# Patient Record
Sex: Female | Born: 1975
Health system: Southern US, Community
[De-identification: ages and names within clinical notes are randomized; demographics above are authoritative.]

## PROBLEM LIST (undated history)

## (undated) DIAGNOSIS — D649 Anemia, unspecified: Secondary | ICD-10-CM

## (undated) DIAGNOSIS — E119 Type 2 diabetes mellitus without complications: Secondary | ICD-10-CM

## (undated) DIAGNOSIS — F329 Major depressive disorder, single episode, unspecified: Secondary | ICD-10-CM

## (undated) DIAGNOSIS — F419 Anxiety disorder, unspecified: Secondary | ICD-10-CM

## (undated) DIAGNOSIS — M1990 Unspecified osteoarthritis, unspecified site: Secondary | ICD-10-CM

## (undated) DIAGNOSIS — R51 Headache: Secondary | ICD-10-CM

## (undated) DIAGNOSIS — I10 Essential (primary) hypertension: Secondary | ICD-10-CM

## (undated) DIAGNOSIS — F32A Depression, unspecified: Secondary | ICD-10-CM

## (undated) HISTORY — PX: ABLATION: SHX5711

## (undated) HISTORY — PX: WISDOM TOOTH EXTRACTION: SHX21

## (undated) HISTORY — DX: Anemia, unspecified: D64.9

## (undated) HISTORY — DX: Unspecified osteoarthritis, unspecified site: M19.90

## (undated) HISTORY — DX: Type 2 diabetes mellitus without complications: E11.9

---

## 1998-02-19 ENCOUNTER — Other Ambulatory Visit: Admission: RE | Admit: 1998-02-19 | Discharge: 1998-02-19 | Payer: Self-pay | Admitting: Obstetrics and Gynecology

## 1999-03-12 ENCOUNTER — Other Ambulatory Visit: Admission: RE | Admit: 1999-03-12 | Discharge: 1999-03-12 | Payer: Self-pay | Admitting: *Deleted

## 1999-09-09 ENCOUNTER — Emergency Department (HOSPITAL_COMMUNITY): Admission: EM | Admit: 1999-09-09 | Discharge: 1999-09-09 | Payer: Self-pay | Admitting: *Deleted

## 2000-03-16 ENCOUNTER — Other Ambulatory Visit: Admission: RE | Admit: 2000-03-16 | Discharge: 2000-03-16 | Payer: Self-pay | Admitting: *Deleted

## 2000-04-06 HISTORY — PX: FRACTURE SURGERY: SHX138

## 2000-05-20 ENCOUNTER — Emergency Department (HOSPITAL_COMMUNITY): Admission: EM | Admit: 2000-05-20 | Discharge: 2000-05-20 | Payer: Self-pay | Admitting: Emergency Medicine

## 2001-02-09 ENCOUNTER — Emergency Department (HOSPITAL_COMMUNITY): Admission: EM | Admit: 2001-02-09 | Discharge: 2001-02-09 | Payer: Self-pay | Admitting: Emergency Medicine

## 2001-02-09 ENCOUNTER — Encounter: Payer: Self-pay | Admitting: Emergency Medicine

## 2001-02-16 ENCOUNTER — Inpatient Hospital Stay (HOSPITAL_COMMUNITY): Admission: AD | Admit: 2001-02-16 | Discharge: 2001-02-19 | Payer: Self-pay | Admitting: *Deleted

## 2002-04-13 ENCOUNTER — Other Ambulatory Visit: Admission: RE | Admit: 2002-04-13 | Discharge: 2002-04-13 | Payer: Self-pay | Admitting: *Deleted

## 2003-04-18 ENCOUNTER — Other Ambulatory Visit: Admission: RE | Admit: 2003-04-18 | Discharge: 2003-04-18 | Payer: Self-pay | Admitting: *Deleted

## 2003-12-05 ENCOUNTER — Emergency Department (HOSPITAL_COMMUNITY): Admission: EM | Admit: 2003-12-05 | Discharge: 2003-12-05 | Payer: Self-pay | Admitting: *Deleted

## 2004-08-18 ENCOUNTER — Ambulatory Visit (HOSPITAL_COMMUNITY): Admission: RE | Admit: 2004-08-18 | Discharge: 2004-08-18 | Payer: Self-pay | Admitting: Gastroenterology

## 2004-08-18 ENCOUNTER — Encounter (INDEPENDENT_AMBULATORY_CARE_PROVIDER_SITE_OTHER): Payer: Self-pay | Admitting: Specialist

## 2005-11-07 ENCOUNTER — Emergency Department (HOSPITAL_COMMUNITY): Admission: EM | Admit: 2005-11-07 | Discharge: 2005-11-07 | Payer: Self-pay | Admitting: Family Medicine

## 2007-02-05 ENCOUNTER — Emergency Department (HOSPITAL_COMMUNITY): Admission: EM | Admit: 2007-02-05 | Discharge: 2007-02-05 | Payer: Self-pay | Admitting: Emergency Medicine

## 2007-06-19 ENCOUNTER — Emergency Department (HOSPITAL_COMMUNITY): Admission: EM | Admit: 2007-06-19 | Discharge: 2007-06-19 | Payer: Self-pay | Admitting: Emergency Medicine

## 2007-11-14 ENCOUNTER — Emergency Department (HOSPITAL_COMMUNITY): Admission: EM | Admit: 2007-11-14 | Discharge: 2007-11-14 | Payer: Self-pay | Admitting: Family Medicine

## 2008-04-06 LAB — HM DIABETES EYE EXAM

## 2008-07-10 ENCOUNTER — Emergency Department (HOSPITAL_COMMUNITY): Admission: EM | Admit: 2008-07-10 | Discharge: 2008-07-10 | Payer: Self-pay | Admitting: Emergency Medicine

## 2009-05-02 ENCOUNTER — Ambulatory Visit (HOSPITAL_COMMUNITY): Admission: RE | Admit: 2009-05-02 | Discharge: 2009-05-02 | Payer: Self-pay | Admitting: Obstetrics & Gynecology

## 2009-09-03 ENCOUNTER — Inpatient Hospital Stay (HOSPITAL_COMMUNITY): Admission: AD | Admit: 2009-09-03 | Discharge: 2009-09-06 | Payer: Self-pay | Admitting: Obstetrics

## 2010-05-24 ENCOUNTER — Emergency Department (HOSPITAL_COMMUNITY)
Admission: EM | Admit: 2010-05-24 | Discharge: 2010-05-24 | Disposition: A | Discharge status: Home / Self Care | Payer: Self-pay | Attending: Emergency Medicine | Admitting: Emergency Medicine

## 2010-05-24 ENCOUNTER — Emergency Department (HOSPITAL_COMMUNITY): Payer: Self-pay

## 2010-05-24 ENCOUNTER — Encounter (HOSPITAL_COMMUNITY): Payer: Self-pay | Admitting: *Deleted

## 2010-05-24 DIAGNOSIS — W108XXA Fall (on) (from) other stairs and steps, initial encounter: Secondary | ICD-10-CM | POA: Insufficient documentation

## 2010-05-24 DIAGNOSIS — Y92009 Unspecified place in unspecified non-institutional (private) residence as the place of occurrence of the external cause: Secondary | ICD-10-CM | POA: Insufficient documentation

## 2010-05-24 DIAGNOSIS — O99891 Other specified diseases and conditions complicating pregnancy: Secondary | ICD-10-CM | POA: Insufficient documentation

## 2010-05-24 DIAGNOSIS — S52539A Colles' fracture of unspecified radius, initial encounter for closed fracture: Secondary | ICD-10-CM | POA: Insufficient documentation

## 2010-05-24 DIAGNOSIS — I10 Essential (primary) hypertension: Secondary | ICD-10-CM | POA: Insufficient documentation

## 2010-06-23 LAB — CBC
HCT: 35.1 % — ABNORMAL LOW (ref 36.0–46.0)
HCT: 35.8 % — ABNORMAL LOW (ref 36.0–46.0)
Hemoglobin: 11.8 g/dL — ABNORMAL LOW (ref 12.0–15.0)
Hemoglobin: 12.4 g/dL (ref 12.0–15.0)
MCHC: 33.7 g/dL (ref 30.0–36.0)
MCHC: 34.5 g/dL (ref 30.0–36.0)
MCV: 88.5 fL (ref 78.0–100.0)
MCV: 86.5 fL (ref 78.0–100.0)
Platelets: 277 10*3/uL (ref 150–400)
Platelets: 301 10*3/uL (ref 150–400)
RBC: 3.97 MIL/uL (ref 3.87–5.11)
RBC: 4.14 MIL/uL (ref 3.87–5.11)
RDW: 14.1 % (ref 11.5–15.5)
RDW: 14.5 % (ref 11.5–15.5)
WBC: 19.6 10*3/uL — ABNORMAL HIGH (ref 4.0–10.5)
WBC: 8.7 10*3/uL (ref 4.0–10.5)

## 2010-06-23 LAB — RPR: RPR Ser Ql: NONREACTIVE

## 2010-08-06 ENCOUNTER — Inpatient Hospital Stay (HOSPITAL_COMMUNITY)
Admission: AD | Admit: 2010-08-06 | Discharge: 2010-08-06 | Disposition: A | Discharge status: Home / Self Care | Payer: Medicaid Other | Source: Ambulatory Visit | Attending: Obstetrics | Admitting: Obstetrics

## 2010-08-06 DIAGNOSIS — R1032 Left lower quadrant pain: Secondary | ICD-10-CM

## 2010-08-06 DIAGNOSIS — O4700 False labor before 37 completed weeks of gestation, unspecified trimester: Secondary | ICD-10-CM | POA: Insufficient documentation

## 2010-08-06 LAB — URINALYSIS, ROUTINE W REFLEX MICROSCOPIC
Bilirubin Urine: NEGATIVE
Hgb urine dipstick: NEGATIVE
Ketones, ur: NEGATIVE mg/dL
Nitrite: NEGATIVE
Protein, ur: NEGATIVE mg/dL
Specific Gravity, Urine: 1.025 (ref 1.005–1.030)
Urobilinogen, UA: 0.2 mg/dL (ref 0.0–1.0)

## 2010-08-06 LAB — URINE MICROSCOPIC-ADD ON

## 2010-08-22 NOTE — Consult Note (Signed)
[facility]  Patient:    Stacey Ruiz, Stacey Ruiz Visit Number: [id number] MRN: [id number]          Service Type: EMS Location: ED Attending Physician:  Ilene Qua Dictated by:   Sherri Rad, M.D. Proc. Date: 02/09/01 Admit Date:  02/09/2001 Discharge Date: 02/09/2001                            Consultation Report  CHIEF COMPLAINT:  Left wrist pain.  HISTORY:  Ms. Orson Aloe is a 35 year old, 39-week-pregnant female who was horseplaying with her boyfriend when she slipped and fell onto an outstretched left upper extremity.  She had immediate pain in her left wrist.  She was then taken to [facility] ER where x-rays were obtained and I was consulted for evaluation and treatment.  PHYSICAL EXAMINATION:  EXTREMITIES:  She has tenderness to palpation over the radial styloid area with swelling.  She is nontender anywhere else.  Active range of motion of the fingers are intact.  The compartments are soft in the hand and forearm.  X-RAY FINDINGS:  X-rays reveal a displaced intra-articular radial styloid fracture.  IMPRESSION:  Intra-articular radial styloid fracture, left wrist.  PLAN:  We will place her in a sugar-tong splint.  She is to follow up with Dr. Onalee Hua III as soon as possible for surgical management of this displaced radial styloid.  If she has any problems or concerns, she is to call.  She is to keep this elevated.  Active range of motion of the fingers is encouraged. Dictated by:   Sherri Rad, M.D. Attending Physician:  Ilene Qua DD:  02/09/01 TD:  02/10/01 Job: [id number]

## 2010-08-22 NOTE — Op Note (Signed)
[facility]  Patient:    Stacey Ruiz, Stacey Ruiz Visit Number: [id number] MRN: [id number]          Service Type: OBS Location: 910B 9154 01 Attending Physician:  Lenoard Aden Dictated by:   Lenoard Aden, M.D. Proc. Date: 02/17/01 Admit Date:  02/16/2001   CC:         [facility]   Operative Report  OPERATION:                    Delivery note.  DESCRIPTION OF PROCEDURE:     I was called into the room to attend to a fetal bradycardia, fetal heart tones questionably in the 30-40 beat per minute range.  Internal scalp electrode intact, was removed.  External monitoring revealed the fetal heart tones to be in the 120-130 beat per minute range. Scalp electrode is replaced, concurring with fetal heart tones in the 120-130 beat per minute range.  Patient is without epidural and feels increased rectal pressure.  She is anterior lip and pushes well to reduce the lip.  After three spontaneous pushes, fetal vertex is apparent.  Bulb suctioning on perineum, head reaction, "girdle sign" is noted on the perineum after bulb suctioning. Patients delivery of the anterior shoulder is met with great difficulty with mild attempts at lateral traction.  At this time the decision is made to place the patient in McRoberts.  McRoberts maneuver is performed without success, and the suprapubic pressure is also met without success.  The patient is uncooperative due to a lack of regional anesthesia and nursing assistant, pediatric assistant in attendance.  At this time a fourth degree midline episiotomy is cut.  Entry and deliver of the posterior shoulder is accomplished for spontaneous delivery of a full-term female fetus, Apgars of 3 and 9, with pediatricians in attendance.  Placenta is delivered spontaneously intact.  Three-vessel cord is noted at the time of delivery.  Repair of the fourth degree laceration done in the standard fashion using a 4-0 Vicryl, a  0 Vicryl, and a 3-0 Monocryl.  After completing the repair, rectal exam reveals an intact rectum, no evidence of dehiscence along the line of the rectal incision.  There are no vaginal wall lacerations as well.  No periclitoral lacerations.  Estimated blood loss of 800 cc is noted.  Cervix is without lacerations.  No complications are noted.  The patient tolerates the procedure well and is recovering in good condition.  Neonate is also recovering in good condition.  Umbilical artery pH which was collected was 7.20. Dictated by:   Lenoard Aden, M.D. Attending Physician:  Lenoard Aden DD:  02/17/01 TD:  02/17/01 Job: [id number]

## 2010-08-22 NOTE — H&P (Signed)
[facility]  Patient:    Stacey Ruiz, HASSAN Visit Number: [id number] MRN: [id number]          Service Type: EMS Location: ED Attending Physician:  Ilene Qua Dictated by:   Marina Gravel, M.D. Admit Date:  02/09/2001 Discharge Date: 02/09/2001                           History and Physical  ADMISSION DIAGNOSES:          1. Post dates pregnancy.                               2. Unfavorable cervix.                               3. Group B strep positive.  PLANNED PROCEDURE:            Cervical ripening and subsequent induction of labor.  HISTORY OF PRESENT ILLNESS:   A 35 year old African-American female, gravida 2 para 0 A1 admitted at 34 and six-sevenths weeks for induction of labor.  The patient has an unfavorable cervix at fingertip and 50%, therefore comes in the night before induction for cervical ripening with Cytotec.  Of note, the patient sustained a fracture to the left wrist last week and there are plans for an internal fixation next week after delivery of her baby. Her hand surgeon is Dr. Onalee Hua.  Prenatal care at [facility] with Dr. Marina Gravel complicated by second trimester vaginal bleeding.  Had been noted initially to have placenta previa but this had resolved by 20 weeks.  Otherwise, the patients prenatal care has been uncomplicated.  Of note, she is group B strep positive.  PAST MEDICAL HISTORY:         None.  PAST SURGICAL HISTORY:        Arthroscopy of the knee.  MEDICATIONS:                  Prenatal vitamins.  ALLERGIES:                    None.  SOCIAL HISTORY:               No alcohol, tobacco, or other drugs.  PRENATAL LABORATORY DATA:     A positive.  Rubella immune.  Hepatitis B, HIV, GC and chlamydia - all negative.  One-hour glucose tolerance test was elevated.  Subsequent three-hour test was within normal limits.  Group B strep positive.  PHYSICAL EXAMINATION:  VITAL SIGNS:                   Blood pressure 118/90, weight 196.  HEART:                        Regular rate and rhythm.  LUNGS:                        Clear to auscultation.  ABDOMEN:                      Gravid.  Fundal height appropriate for gestational age.  PELVIC:                       Cervix is  fingertip dilated, 50% effaced, high presenting part.  LABORATORY DATA:              Ultrasound performed on February 15, 2001 showed estimated fetal weight 7 pounds 6 ounces (3358 grams) with normal amniotic fluid volume and 8/8 biophysical profile.  Posterior placenta again noted.  ASSESSMENT:                   Post term pregnancy with unfavorable cervix and need for orthopedic surgery next week.  PLAN:                         Induction of labor.  On the night prior to induction patient will receive Cytotec for cervical ripening followed by Pitocin the following morning.  Dictated by:   Marina Gravel, M.D.  Attending Physician:  Ilene Qua DD:  02/15/01 TD:  02/15/01 Job: [id number]

## 2010-08-22 NOTE — Discharge Summary (Signed)
[facility]  Patient:    Stacey Ruiz, Stacey Ruiz Visit Number: [id number] MRN: [id number]          Service Type: OBS Location: 910A 9132 01 Attending Physician:  Lenoard Aden Dictated by:   Lenoard Aden, M.D. Admit Date:  02/16/2001 Discharge Date: 02/19/2001   CC:         [facility]   Discharge Summary  SUMMARY:                      The patient was admitted on February 16, 2001 for induction and underwent complicated delivery on February 17, 2001 with moderate to severe shoulder dystocia.  Postpartum course was complicated by postpartum syncopal event with uterine atony. The patient had a stable hemoglobin and hematocrit at 8.1 postpartum. She is discharged to home on day #2.  DISCHARGE MEDICATIONS:        Tylox, Motrin, and Colace.  DISCHARGE FOLLOWUP:           Followup is scheduled in the office in four weeks. Dictated by:   Lenoard Aden, M.D. Attending Physician:  Lenoard Aden DD:  03/16/01 TD:  03/17/01 Job: [id number]
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
## 2010-08-22 NOTE — Op Note (Signed)
Stacey Ruiz, Stacey Ruiz            ACCOUNT NO.:  [id number]   MEDICAL RECORD NO.:  [id number]          PATIENT TYPE:  AMB   LOCATION:  ENDO                         FACILITY:  [facility]   PHYSICIAN:  Anselmo Rod, M.D.  DATE OF BIRTH:  07-25-1975   DATE OF PROCEDURE:  08/18/2004  DATE OF DISCHARGE:                                 OPERATIVE REPORT   PROCEDURE PERFORMED:  Colonoscopy with cold biopsies times four.   ENDOSCOPIST:  Charna Elizabeth, M.D.   INSTRUMENT USED:  Olympus video colonoscope.   INDICATIONS FOR PROCEDURE:  The patient is a 35 year old African-American  female undergoing screening colonoscopy for family history of colon cancer.  Her mother died of colon cancer at 74.  Rule out colonic polyps, masses,  etc.  The patient has had history of  mild anemia and occasional bright red  blood per rectum with straining.   PREPROCEDURE PREPARATION:  Informed consent was procured from the patient.  The patient was fasted for eight hours prior to the procedure and prepped  with a bottle of magnesium citrate and a gallon of GoLYTELY the night prior  to the procedure.  The risks and benefits of the procedure including a 10%  miss rate for colon polyps or cancers was discussed with the patient as  well.   PREPROCEDURE PHYSICAL:  The patient had stable vital signs.  Neck supple.  Chest clear to auscultation.  S1 and S2 regular.  Abdomen soft with normal  bowel sounds.   DESCRIPTION OF PROCEDURE:  The patient was placed in left lateral decubitus  position and sedated with 80 mg of Demerol and 10 mg of Versed in slow  incremental doses.  Once the patient was adequately sedated and maintained  on low flow oxygen and continuous cardiac monitoring, the Olympus video  colonoscope was advanced from the rectum to the cecum. The appendicular  orifice and ileocecal valve were clearly visualized and photographed.  The  patient had an excellent prep.  The terminal ileum appeared healthy and  without lesions.  Four small sessile polyps were biopsied from the rectum.  Some erythematous mottling was noted in the right colon.  Multiple biopsies  were done.  The exact nature of this finding is not clear to me.  Retroflexion in the rectum.  Retroflexion revealed no abnormalities.  The  patient tolerated the procedure well without complication.   IMPRESSION:  1. Four small sessile polyps biopsied from the rectum (cold biopsies).  2. Erythematous mottling of the proximal right colon, biopsies done.      Normal terminal ileum.     RECOMMENDATIONS:  1. Await pathology results.  2. Avoid all nonsteroidals including aspirin for now.  3. Repeat colorectal cancer screening depending on pathology results.  4. Outpatient followup as need arises in the future.        JNM/MEDQ  D:  08/18/2004  T:  08/18/2004  Job:  [id number]   cc:   Gerri Spore B. Earlene Plater, M.D.  [address]  [place]  [place] [address]  Fax: [phone]

## 2010-09-15 ENCOUNTER — Inpatient Hospital Stay (HOSPITAL_COMMUNITY)
Admission: AD | Admit: 2010-09-15 | Discharge: 2010-09-17 | DRG: 775 | Disposition: A | Discharge status: Home / Self Care | Payer: Medicaid Other | Source: Ambulatory Visit | Attending: Obstetrics | Admitting: Obstetrics

## 2010-09-15 DIAGNOSIS — O09529 Supervision of elderly multigravida, unspecified trimester: Secondary | ICD-10-CM | POA: Diagnosis present

## 2010-09-15 LAB — CBC
HCT: 35.3 % — ABNORMAL LOW (ref 36.0–46.0)
MCV: 83.1 fL (ref 78.0–100.0)
RBC: 4.25 MIL/uL (ref 3.87–5.11)
RDW: 14.1 % (ref 11.5–15.5)
WBC: 9.4 10*3/uL (ref 4.0–10.5)

## 2010-09-16 LAB — CBC
Hemoglobin: 10.4 g/dL — ABNORMAL LOW (ref 12.0–15.0)
MCH: 28.7 pg (ref 26.0–34.0)
MCV: 84 fL (ref 78.0–100.0)
Platelets: 250 10*3/uL (ref 150–400)
RBC: 3.63 MIL/uL — ABNORMAL LOW (ref 3.87–5.11)
WBC: 13.2 10*3/uL — ABNORMAL HIGH (ref 4.0–10.5)

## 2010-09-16 LAB — ABO/RH: ABO/RH(D): A POS

## 2010-09-18 ENCOUNTER — Inpatient Hospital Stay (HOSPITAL_COMMUNITY): Admission: AD | Admit: 2010-09-18 | Payer: Medicaid Other | Source: Ambulatory Visit | Admitting: Obstetrics

## 2010-10-28 ENCOUNTER — Other Ambulatory Visit: Payer: Self-pay | Admitting: Obstetrics & Gynecology

## 2010-10-28 DIAGNOSIS — Z302 Encounter for sterilization: Secondary | ICD-10-CM

## 2010-11-12 ENCOUNTER — Encounter (HOSPITAL_COMMUNITY): Payer: Self-pay | Admitting: *Deleted

## 2010-11-28 ENCOUNTER — Encounter (HOSPITAL_COMMUNITY): Payer: Self-pay | Admitting: Anesthesiology

## 2010-11-28 ENCOUNTER — Encounter (HOSPITAL_COMMUNITY)
Admission: RE | Disposition: A | Discharge status: Home / Self Care | Payer: Self-pay | Source: Ambulatory Visit | Attending: Obstetrics & Gynecology

## 2010-11-28 ENCOUNTER — Ambulatory Visit (HOSPITAL_COMMUNITY): Payer: BC Managed Care – PPO | Admitting: Anesthesiology

## 2010-11-28 ENCOUNTER — Ambulatory Visit (HOSPITAL_COMMUNITY)
Admission: RE | Admit: 2010-11-28 | Discharge: 2010-11-28 | Disposition: A | Discharge status: Home / Self Care | Payer: BC Managed Care – PPO | Source: Ambulatory Visit | Attending: Obstetrics & Gynecology | Admitting: Obstetrics & Gynecology

## 2010-11-28 DIAGNOSIS — Z302 Encounter for sterilization: Secondary | ICD-10-CM | POA: Insufficient documentation

## 2010-11-28 DIAGNOSIS — Z309 Encounter for contraceptive management, unspecified: Secondary | ICD-10-CM

## 2010-11-28 HISTORY — DX: Major depressive disorder, single episode, unspecified: F32.9

## 2010-11-28 HISTORY — DX: Depression, unspecified: F32.A

## 2010-11-28 HISTORY — DX: Headache: R51

## 2010-11-28 HISTORY — DX: Anxiety disorder, unspecified: F41.9

## 2010-11-28 HISTORY — PX: TUBAL LIGATION: SHX77

## 2010-11-28 LAB — CBC
HCT: 40.2 % (ref 36.0–46.0)
Hemoglobin: 13.7 g/dL (ref 12.0–15.0)
RBC: 4.69 MIL/uL (ref 3.87–5.11)

## 2010-11-28 LAB — HCG, SERUM, QUALITATIVE: Preg, Serum: NEGATIVE

## 2010-11-28 LAB — BASIC METABOLIC PANEL
CO2: 29 mEq/L (ref 19–32)
Chloride: 99 mEq/L (ref 96–112)
Glucose, Bld: 93 mg/dL (ref 70–99)
Sodium: 137 mEq/L (ref 135–145)

## 2010-11-28 SURGERY — ESSURE TUBAL STERILIZATION
Anesthesia: General | Site: Vagina | Wound class: Clean Contaminated

## 2010-11-28 MED ORDER — MIDAZOLAM HCL 2 MG/2ML IJ SOLN
INTRAMUSCULAR | Status: AC
Start: 2010-11-28 — End: 2010-11-28
  Filled 2010-11-28: qty 2

## 2010-11-28 MED ORDER — KETOROLAC TROMETHAMINE 30 MG/ML IJ SOLN
INTRAMUSCULAR | Status: AC
  Administered 2010-11-28: 30 mg via INTRAVENOUS
  Filled 2010-11-28: qty 1

## 2010-11-28 MED ORDER — NORETHINDRONE ACETATE 5 MG PO TABS: 5.0000 mg | ORAL_TABLET | Freq: Every day | ORAL | Status: DC

## 2010-11-28 MED ORDER — FENTANYL CITRATE 0.05 MG/ML IJ SOLN
INTRAMUSCULAR | Status: DC | PRN
  Administered 2010-11-28: 100 ug via INTRAVENOUS

## 2010-11-28 MED ORDER — ONDANSETRON HCL 4 MG/2ML IJ SOLN
INTRAMUSCULAR | Status: DC | PRN
  Administered 2010-11-28: 4 mg via INTRAVENOUS

## 2010-11-28 MED ORDER — PROMETHAZINE HCL 25 MG/ML IJ SOLN: 6.2500 mg | INTRAMUSCULAR | Status: DC | PRN

## 2010-11-28 MED ORDER — LACTATED RINGERS IV SOLN
INTRAVENOUS | Status: DC
  Administered 2010-11-28 (×3): via INTRAVENOUS

## 2010-11-28 MED ORDER — KETOROLAC TROMETHAMINE 30 MG/ML IJ SOLN
30.0000 mg | Freq: Once | INTRAMUSCULAR | Status: AC
  Administered 2010-11-28: 30 mg via INTRAVENOUS

## 2010-11-28 MED ORDER — FENTANYL CITRATE 0.05 MG/ML IJ SOLN: 25.0000 ug | INTRAMUSCULAR | Status: DC | PRN

## 2010-11-28 MED ORDER — ACETAMINOPHEN 325 MG PO TABS: 325.0000 mg | ORAL_TABLET | ORAL | Status: DC | PRN

## 2010-11-28 MED ORDER — ONDANSETRON HCL 4 MG/2ML IJ SOLN
INTRAMUSCULAR | Status: AC
  Filled 2010-11-28: qty 2

## 2010-11-28 MED ORDER — FENTANYL CITRATE 0.05 MG/ML IJ SOLN
INTRAMUSCULAR | Status: AC
  Filled 2010-11-28: qty 2

## 2010-11-28 MED ORDER — SODIUM CHLORIDE 0.9 % IR SOLN
Status: DC | PRN
  Administered 2010-11-28: 3000 mL

## 2010-11-28 MED ORDER — DEXAMETHASONE SODIUM PHOSPHATE 10 MG/ML IJ SOLN
INTRAMUSCULAR | Status: AC
  Filled 2010-11-28: qty 1

## 2010-11-28 MED ORDER — MIDAZOLAM HCL 5 MG/5ML IJ SOLN
INTRAMUSCULAR | Status: DC | PRN
  Administered 2010-11-28: 2 mg via INTRAVENOUS

## 2010-11-28 MED ORDER — PROPOFOL 10 MG/ML IV EMUL
INTRAVENOUS | Status: DC | PRN
  Administered 2010-11-28: 180 mg via INTRAVENOUS

## 2010-11-28 MED ORDER — KETOROLAC TROMETHAMINE 30 MG/ML IJ SOLN: 15.0000 mg | Freq: Once | INTRAMUSCULAR | Status: DC | PRN

## 2010-11-28 MED ORDER — LIDOCAINE HCL (CARDIAC) 20 MG/ML IV SOLN
INTRAVENOUS | Status: AC
  Filled 2010-11-28: qty 5

## 2010-11-28 MED ORDER — DEXAMETHASONE SODIUM PHOSPHATE 10 MG/ML IJ SOLN
INTRAMUSCULAR | Status: DC | PRN
  Administered 2010-11-28: 10 mg via INTRAVENOUS

## 2010-11-28 MED ORDER — LIDOCAINE HCL (PF) 2 % IJ SOLN
INTRAMUSCULAR | Status: DC | PRN
  Administered 2010-11-28: 10 mL

## 2010-11-28 MED ORDER — PROPOFOL 10 MG/ML IV EMUL
INTRAVENOUS | Status: AC
  Filled 2010-11-28: qty 20

## 2010-11-28 MED ORDER — LIDOCAINE HCL (CARDIAC) 20 MG/ML IV SOLN
INTRAVENOUS | Status: DC | PRN
  Administered 2010-11-28: 60 mg via INTRAVENOUS

## 2010-11-28 SURGICAL SUPPLY — 10 items
CATH ROBINSON RED A/P 16FR (CATHETERS) ×2 IMPLANT
CLOTH BEACON ORANGE TIMEOUT ST (SAFETY) ×2 IMPLANT
CONTAINER PREFILL 10% NBF 60ML (FORM) IMPLANT
DRAPE UTILITY XL STRL (DRAPES) ×2 IMPLANT
GLOVE BIO SURGEON STRL SZ 6.5 (GLOVE) ×4 IMPLANT
GOWN PREVENTION PLUS LG XLONG (DISPOSABLE) ×4 IMPLANT
KIT ESSURE FALLOPIAN CLOSURE (Ring) ×2 IMPLANT
PACK HYSTEROSCOPY LF (CUSTOM PROCEDURE TRAY) ×2 IMPLANT
TOWEL OR 17X24 6PK STRL BLUE (TOWEL DISPOSABLE) ×4 IMPLANT
WATER STERILE IRR 1000ML POUR (IV SOLUTION) ×2 IMPLANT

## 2010-11-28 NOTE — H&P (Signed)
  Chief Complaint: 35 y.o.  who presents for an Essure procedure  Details of Present Illness: The patient desires a sterilization procedure.  BP 120/73  Pulse 82  Temp(Src) 98.8 F (37.1 C) (Oral)  Resp 18  Ht 5\' 5"  (1.651 m)  Wt 83.915 kg (185 lb)  BMI 30.79 kg/m2  SpO2 100%  LMP 11/02/2010  Breastfeeding? No  Past Medical History  Diagnosis Date  . Headache     migraines  . Anxiety   . Depression    History   Social History  . Marital Status: Married    Spouse Name: N/A    Number of Children: N/A  . Years of Education: N/A   Occupational History  . Not on file.   Social History Main Topics  . Smoking status: Former Smoker    Quit date: 11/12/2007  . Smokeless tobacco: Not on file  . Alcohol Use: 4.2 oz/week    7 Glasses of wine per week  . Drug Use: Yes    Special: Marijuana     highschool  . Sexually Active: Yes    Birth Control/ Protection: Injection   Other Topics Concern  . Not on file   Social History Narrative  . No narrative on file   History reviewed. No pertinent family history.  Genitourinary:positive for abnormal menstrual periods  Pre-Op Diagnosis: abnormal uterine bleeding; desire sterilization   Planned Procedure: Procedure(s): ESSURE TUBAL STERILIZATION  I have reviewed the patient's history and have completed the physical exam and Stacey Ruiz is acceptable for surgery.  Roseanna Rainbow, MD 11/28/2010 2:43 PM

## 2010-11-28 NOTE — Anesthesia Preprocedure Evaluation (Signed)
Anesthesia Evaluation  Name, MR# and DOB Patient awake  General Assessment Comment  Reviewed: Allergy & Precautions, H&P , Patient's Chart, lab work & pertinent test results, reviewed documented beta blocker date and time   History of Anesthesia Complications Negative for: history of anesthetic complications  Airway Mallampati: II TM Distance: >3 FB Neck ROM: full    Dental No notable dental hx.    Pulmonary  clear to auscultation  pulmonary exam normalPulmonary Exam Normal breath sounds clear to auscultation none    Cardiovascular Exercise Tolerance: Good hypertension, On Medications regular Normal    Neuro/Psych   Headaches  (+) PSYCHIATRIC DISORDERS,  Negative Neurological ROS  Negative Psych ROS  GI/Hepatic/Renal negative GI ROS  negative Liver ROS  negative Renal ROS        Endo/Other  Negative Endocrine ROS (+)      Abdominal   Musculoskeletal   Hematology negative hematology ROS (+)   Peds  Reproductive/Obstetrics negative OB ROS    Anesthesia Other Findings             Anesthesia Physical Anesthesia Plan  ASA: II  Anesthesia Plan: General   Post-op Pain Management:    Induction:   Airway Management Planned:   Additional Equipment:   Intra-op Plan:   Post-operative Plan:   Informed Consent: I have reviewed the patients History and Physical, chart, labs and discussed the procedure including the risks, benefits and alternatives for the proposed anesthesia with the patient or authorized representative who has indicated his/her understanding and acceptance.   Dental Advisory Given  Plan Discussed with: CRNA and Surgeon  Anesthesia Plan Comments:         Anesthesia Quick Evaluation

## 2010-11-28 NOTE — Op Note (Signed)
Preoperative diagnosis: Multiparity, desires sterilization  Postoperative diagnosis: Same  Procedure: Hysteroscopy, Essure tubal occlusion Surgeon: Antionette Char A  Anesthesia:LMA, paracervical block  Estimated blood loss: Minimal  Urine output:  per Anesthesiology  IV Fluids:  per Anesthesiology  Complications: None  Specimen: N/A  Operative Findings: Lush endometrium  Description of procedure:   The patient was taken to the operating room and placed on the operating table in the semi-lithotomy position in [place] stirrups.  Examination under anesthesia was performed.  The patient was prepped and draped in the usual manner.  After a time-out had been completed, a speculum was placed in the vagina.  The anterior lip of the cervix was grasped with a single-toothed tenaculum.  A paracervical block was performed using 10 ml of 1% lidocaine.  The block was performed at 4 and 8 o'clock at the cervical vaginal junction.  A 5 mm 30 hysteroscope was then inserted under direct visualization using glycine as a distending medium. The uterine cavity was viewed and the above findings were noted. The Essure tubal occlusion was then inserted through the operative port and the tip of the Essure device easily slid into the left ostia. The coil was advanced and easily placed.   The device was withdrawn. There were 0 trailing coils in the uterine cavity after removal of the insertion device. The device was removed and reloaded. The device was inserted into the right ostia in a similar fashion. There were 6 trailing coils in the uterine cavity.  The device was remove with a grasper.  Subsequent attempts to place another device were aborted secondary limited visualization--unable to distend the cavity.  All the instruments were removed from the vagina.  Final instrument counts were correct.  The patient was taken to the PACU in stable condition.

## 2010-11-28 NOTE — Transfer of Care (Signed)
  Anesthesia Post-op Note  Patient: Stacey Ruiz  Procedure(s) Performed:  ESSURE TUBAL STERILIZATION - Attempted essure sterilization. Essure device placed in leftt side only. could not do procedure on right fallopian tube.   Patient Location: PACU  Anesthesia Type: General  Level of Consciousness: awake, alert  and oriented  Airway and Oxygen Therapy: Patient Spontanous Breathing and Patient connected to nasal cannula oxygen  Post-op Pain: none  Post-op Assessment: Post-op Vital signs reviewed and Patient's Cardiovascular Status Stable  Post-op Vital Signs: Reviewed and stable  Complications: No apparent anesthesia complications

## 2010-11-28 NOTE — Anesthesia Postprocedure Evaluation (Signed)
Anesthesia Post Note  Patient: Stacey Ruiz  Anesthesia Post Note  Patient: Stacey Ruiz  Procedure(s) Performed:  ESSURE TUBAL STERILIZATION - Attempted essure sterilization. Essure device placed in leftt side only. could not do procedure on right fallopian tube.   Anesthesia type: GA  Patient location: PACU  Post pain: Pain level controlled  Post assessment: Post-op Vital signs reviewed  Last Vitals:  Filed Vitals:   11/28/10 1645  BP: 114/70  Pulse: 65  Temp:   Resp: 12    Post vital signs: Reviewed  Level of consciousness: sedated  Complications: No apparent anesthesia complications

## 2010-12-01 ENCOUNTER — Other Ambulatory Visit: Payer: Self-pay | Admitting: Obstetrics & Gynecology

## 2010-12-04 ENCOUNTER — Other Ambulatory Visit: Payer: Self-pay | Admitting: Obstetrics & Gynecology

## 2010-12-05 ENCOUNTER — Encounter (HOSPITAL_COMMUNITY)
Admission: RE | Disposition: A | Discharge status: Home / Self Care | Payer: Self-pay | Source: Ambulatory Visit | Attending: Obstetrics & Gynecology

## 2010-12-05 ENCOUNTER — Encounter (HOSPITAL_COMMUNITY): Payer: Self-pay | Admitting: Anesthesiology

## 2010-12-05 ENCOUNTER — Ambulatory Visit (HOSPITAL_COMMUNITY)
Admission: RE | Admit: 2010-12-05 | Discharge: 2010-12-05 | Disposition: A | Discharge status: Home / Self Care | Payer: BC Managed Care – PPO | Source: Ambulatory Visit | Attending: Obstetrics & Gynecology | Admitting: Obstetrics & Gynecology

## 2010-12-05 ENCOUNTER — Ambulatory Visit (HOSPITAL_COMMUNITY): Payer: BC Managed Care – PPO | Admitting: Anesthesiology

## 2010-12-05 ENCOUNTER — Encounter (HOSPITAL_COMMUNITY): Payer: Self-pay | Admitting: Obstetrics & Gynecology

## 2010-12-05 DIAGNOSIS — Z302 Encounter for sterilization: Secondary | ICD-10-CM | POA: Insufficient documentation

## 2010-12-05 HISTORY — PX: TUBAL LIGATION: SHX77

## 2010-12-05 LAB — SURGICAL PCR SCREEN: MRSA, PCR: NEGATIVE

## 2010-12-05 LAB — PREGNANCY, URINE: Preg Test, Ur: NEGATIVE

## 2010-12-05 SURGERY — ESSURE TUBAL STERILIZATION
Anesthesia: General | Site: Vagina | Laterality: Right | Wound class: Clean Contaminated

## 2010-12-05 MED ORDER — OXYCODONE-ACETAMINOPHEN 5-325 MG PO TABS: 2.0000 | ORAL_TABLET | Freq: Four times a day (QID) | ORAL | Status: AC | PRN

## 2010-12-05 MED ORDER — MIDAZOLAM HCL 5 MG/5ML IJ SOLN
INTRAMUSCULAR | Status: DC | PRN
  Administered 2010-12-05: .5 mg via INTRAVENOUS
  Administered 2010-12-05: 1.5 mg via INTRAVENOUS

## 2010-12-05 MED ORDER — KETOROLAC TROMETHAMINE 30 MG/ML IJ SOLN
INTRAMUSCULAR | Status: DC | PRN
  Administered 2010-12-05: 30 mg via INTRAVENOUS

## 2010-12-05 MED ORDER — KETOROLAC TROMETHAMINE 30 MG/ML IJ SOLN
INTRAMUSCULAR | Status: AC
  Administered 2010-12-05: 30 mg via INTRAVENOUS
  Filled 2010-12-05: qty 1

## 2010-12-05 MED ORDER — FENTANYL CITRATE 0.05 MG/ML IJ SOLN
INTRAMUSCULAR | Status: DC | PRN
  Administered 2010-12-05: 100 ug via INTRAVENOUS

## 2010-12-05 MED ORDER — LACTATED RINGERS IV SOLN
INTRAVENOUS | Status: DC
  Administered 2010-12-05 (×2): via INTRAVENOUS

## 2010-12-05 MED ORDER — MIDAZOLAM HCL 2 MG/2ML IJ SOLN
INTRAMUSCULAR | Status: AC
  Filled 2010-12-05: qty 2

## 2010-12-05 MED ORDER — PROPOFOL 10 MG/ML IV EMUL
INTRAVENOUS | Status: DC | PRN
  Administered 2010-12-05: 200 mg via INTRAVENOUS

## 2010-12-05 MED ORDER — DEXAMETHASONE SODIUM PHOSPHATE 4 MG/ML IJ SOLN
INTRAMUSCULAR | Status: DC | PRN
  Administered 2010-12-05: 10 mg via INTRAVENOUS

## 2010-12-05 MED ORDER — KETOROLAC TROMETHAMINE 30 MG/ML IJ SOLN
INTRAMUSCULAR | Status: AC
  Filled 2010-12-05: qty 1

## 2010-12-05 MED ORDER — LIDOCAINE HCL (CARDIAC) 20 MG/ML IV SOLN
INTRAVENOUS | Status: AC
  Filled 2010-12-05: qty 5

## 2010-12-05 MED ORDER — LIDOCAINE HCL (CARDIAC) 20 MG/ML IV SOLN
INTRAVENOUS | Status: DC | PRN
  Administered 2010-12-05: 100 mg via INTRAVENOUS

## 2010-12-05 MED ORDER — MEPERIDINE HCL 25 MG/ML IJ SOLN: 6.2500 mg | INTRAMUSCULAR | Status: DC | PRN

## 2010-12-05 MED ORDER — DEXAMETHASONE SODIUM PHOSPHATE 10 MG/ML IJ SOLN
INTRAMUSCULAR | Status: AC
  Filled 2010-12-05: qty 1

## 2010-12-05 MED ORDER — LIDOCAINE HCL 1 % IJ SOLN
INTRAMUSCULAR | Status: DC | PRN
  Administered 2010-12-05: 10 mL

## 2010-12-05 MED ORDER — ONDANSETRON HCL 4 MG/2ML IJ SOLN
INTRAMUSCULAR | Status: DC | PRN
  Administered 2010-12-05: 4 mg via INTRAVENOUS

## 2010-12-05 MED ORDER — FENTANYL CITRATE 0.05 MG/ML IJ SOLN: 25.0000 ug | INTRAMUSCULAR | Status: DC | PRN

## 2010-12-05 MED ORDER — FENTANYL CITRATE 0.05 MG/ML IJ SOLN
INTRAMUSCULAR | Status: AC
  Filled 2010-12-05: qty 2

## 2010-12-05 MED ORDER — KETOROLAC TROMETHAMINE 30 MG/ML IJ SOLN: 15.0000 mg | Freq: Once | INTRAMUSCULAR | Status: DC | PRN

## 2010-12-05 MED ORDER — MUPIROCIN 2 % EX OINT
TOPICAL_OINTMENT | CUTANEOUS | Status: AC
  Filled 2010-12-05: qty 22

## 2010-12-05 MED ORDER — SODIUM CHLORIDE 0.9 % IR SOLN
Status: DC | PRN
  Administered 2010-12-05: 3000 mL

## 2010-12-05 MED ORDER — ONDANSETRON HCL 4 MG/2ML IJ SOLN: 4.0000 mg | Freq: Once | INTRAMUSCULAR | Status: DC | PRN

## 2010-12-05 MED ORDER — PROPOFOL 10 MG/ML IV EMUL
INTRAVENOUS | Status: AC
  Filled 2010-12-05: qty 20

## 2010-12-05 MED ORDER — ONDANSETRON HCL 4 MG/2ML IJ SOLN
INTRAMUSCULAR | Status: AC
  Filled 2010-12-05: qty 2

## 2010-12-05 SURGICAL SUPPLY — 17 items
CATH ROBINSON RED A/P 16FR (CATHETERS) ×1 IMPLANT
CHLORAPREP W/TINT 26ML (MISCELLANEOUS) ×1 IMPLANT
CLOTH BEACON ORANGE TIMEOUT ST (SAFETY) ×3 IMPLANT
CONTAINER PREFILL 10% NBF 60ML (FORM) IMPLANT
DERMABOND ADVANCED (GAUZE/BANDAGES/DRESSINGS) ×1 IMPLANT
DRAPE UTILITY XL STRL (DRAPES) ×1 IMPLANT
GLOVE BIO SURGEON STRL SZ 6.5 (GLOVE) ×6 IMPLANT
GOWN PREVENTION PLUS LG XLONG (DISPOSABLE) ×2 IMPLANT
KIT ESSURE FALLOPIAN CLOSURE (Ring) ×3 IMPLANT
PACK HYSTEROSCOPY LF (CUSTOM PROCEDURE TRAY) ×3 IMPLANT
PACK LAPAROSCOPY BASIN (CUSTOM PROCEDURE TRAY) ×1 IMPLANT
SUT VIC AB 3-0 PS2 18 (SUTURE)
SUT VIC AB 3-0 PS2 18XBRD (SUTURE) IMPLANT
SUT VICRYL 0 UR6 27IN ABS (SUTURE) IMPLANT
TOWEL OR 17X24 6PK STRL BLUE (TOWEL DISPOSABLE) ×6 IMPLANT
TROCAR XCEL NON-BLD 11X100MML (ENDOMECHANICALS) ×1 IMPLANT
WATER STERILE IRR 1000ML POUR (IV SOLUTION) ×3 IMPLANT

## 2010-12-05 NOTE — Op Note (Signed)
Preoperative diagnosis: Multiparity, desires sterilization  Postoperative diagnosis: Same  Procedure: Hysteroscopy, Essure tubal occlusion Surgeon: Antionette Char A  Anesthesia: LMA, paracervical block  Estimated blood loss: Minimal  Urine output: 100 ml  IV Fluids:  per Anesthesiology  Complications: None  Specimen: N/A  Operative Findings: Normal endometrial cavity.  Slightly lush endometrium.  Description of procedure:   The patient was taken to the operating room and placed on the operating table in the semi-lithotomy position in [place] stirrups.  Examination under anesthesia was performed.  The patient was prepped and draped in the usual manner.  After a time-out had been completed, a speculum was placed in the vagina.  The anterior lip of the cervix was grasped with a single-toothed tenaculum.  A paracervical block was performed using 10 ml of 1% lidocaine.  The block was performed at 4 and 8 o'clock at the cervical vaginal junction.  A 5 mm 30 hysteroscope was then inserted under direct visualization using glycine as a distending medium. The uterine cavity was viewed and the above findings were noted. The Essure tubal occlusion was then inserted through the operative port and the tip of the Essure device easily slid into the right ostia. The coil was advanced and easily placed.   The device was withdrawn. There were 2 trailing coils in the uterine cavity after removal of the insertion device. The device was removed.  The left side was previously done.   All the instruments were removed from the vagina.  Final instrument counts were correct.  The patient was taken to the PACU in stable condition.

## 2010-12-05 NOTE — Transfer of Care (Signed)
Immediate Anesthesia Transfer of Care Note  Patient: Stacey Ruiz  Procedure(s) Performed:  ESSURE TUBAL STERILIZATION  Patient Location: PACU  Anesthesia Type: General  Level of Consciousness: awake, alert  and oriented  Airway & Oxygen Therapy: Patient Spontanous Breathing and Patient connected to nasal cannula oxygen  Post-op Assessment: Report given to PACU RN, Post -op Vital signs reviewed and stable and Patient moving all extremities X 4  Post vital signs: Reviewed and stable  Complications: No apparent anesthesia complications

## 2010-12-05 NOTE — Anesthesia Preprocedure Evaluation (Signed)
Anesthesia Evaluation  Name, MR# and DOB Patient awake  General Assessment Comment  Reviewed: Allergy & Precautions, H&P , NPO status , Patient's Chart, lab work & pertinent test results  Airway Mallampati: I      Dental  (+) Teeth Intact   Pulmonary    pulmonary exam normalPulmonary Exam Normal     Cardiovascular     Neuro/Psych   Headaches  (+) Anxiety, Depression,    GI/Hepatic/Renal negative GI ROS  negative Liver ROS  negative Renal ROS        Endo/Other  Negative Endocrine ROS (+)      Abdominal Normal abdominal exam  (+)   Musculoskeletal negative musculoskeletal ROS (+)   Hematology negative hematology ROS (+)   Peds negative pediatric ROS (+)  Reproductive/Obstetrics negative OB ROS    Anesthesia Other Findings             Anesthesia Physical Anesthesia Plan  ASA: II  Anesthesia Plan: General   Post-op Pain Management:    Induction: Intravenous  Airway Management Planned: Oral ETT  Additional Equipment:   Intra-op Plan:   Post-operative Plan:   Informed Consent:   Plan Discussed with: CRNA  Anesthesia Plan Comments:         Anesthesia Quick Evaluation

## 2010-12-05 NOTE — Anesthesia Postprocedure Evaluation (Signed)
Anesthesia Post Note  Patient: Stacey Ruiz  Procedure(s) Performed:  ESSURE TUBAL STERILIZATION  Anesthesia type: GA  Patient location: PACU  Post pain: Pain level controlled  Post assessment: Post-op Vital signs reviewed  Last Vitals:  Filed Vitals:   12/05/10 1400  BP:   Pulse: 58  Temp:   Resp: 13    Post vital signs: Reviewed  Level of consciousness: sedated  Complications: No apparent anesthesia complications

## 2010-12-05 NOTE — H&P (Signed)
  Chief Complaint: 35 y.o.  Presents to complete the Essure procedure.  Details of Present Illness: One week ago, the left tube was cannulated with the Essure.  Technical issues precluded completion of the procedure.   BP 113/80  Pulse 88  Temp(Src) 98.6 F (37 C) (Oral)  Resp 18  Ht 5\' 5"  (1.651 m)  Wt 83.915 kg (185 lb)  BMI 30.79 kg/m2  SpO2 100%  Past Medical History  Diagnosis Date  . Headache     migraines  . Anxiety   . Depression    History   Social History  . Marital Status: Married    Spouse Name: N/A    Number of Children: N/A  . Years of Education: N/A   Occupational History  . Not on file.   Social History Main Topics  . Smoking status: Former Smoker    Quit date: 11/12/2007  . Smokeless tobacco: Not on file  . Alcohol Use: 4.2 oz/week    7 Glasses of wine per week  . Drug Use: Yes    Special: Marijuana     highschool  . Sexually Active: Yes    Birth Control/ Protection: Injection   Other Topics Concern  . Not on file   Social History Narrative  . No narrative on file   History reviewed. No pertinent family history.  Pertinent items are noted in HPI.  Pre-Op Diagnosis: desires sterilization   Planned Procedure: Procedure(s): ESSURE TUBAL STERILIZATION LAPAROSCOPIC TUBAL LIGATION  I have reviewed the patient's history and have completed the physical exam and Stacey Ruiz is acceptable for surgery.  Roseanna Rainbow, MD 12/05/2010 12:42 PM

## 2010-12-16 ENCOUNTER — Encounter (HOSPITAL_COMMUNITY): Payer: Self-pay | Admitting: Obstetrics & Gynecology

## 2010-12-29 LAB — POCT URINALYSIS DIP (DEVICE)
Bilirubin Urine: NEGATIVE
Hgb urine dipstick: NEGATIVE
Ketones, ur: NEGATIVE
Specific Gravity, Urine: 1.02
pH: 5.5

## 2010-12-29 LAB — POCT PREGNANCY, URINE: Operator id: 235561

## 2011-01-02 LAB — POCT URINALYSIS DIP (DEVICE)
Bilirubin Urine: NEGATIVE
Glucose, UA: NEGATIVE
Hgb urine dipstick: NEGATIVE
Ketones, ur: NEGATIVE
Nitrite: NEGATIVE

## 2011-01-13 LAB — URINALYSIS, ROUTINE W REFLEX MICROSCOPIC
Hgb urine dipstick: NEGATIVE
Nitrite: NEGATIVE
Specific Gravity, Urine: 1.011
Urobilinogen, UA: 1
pH: 6.5

## 2011-01-13 LAB — WET PREP, GENITAL

## 2011-01-13 LAB — POCT PREGNANCY, URINE
Operator id: 26520
Preg Test, Ur: NEGATIVE

## 2011-01-13 LAB — RPR: RPR Ser Ql: NONREACTIVE

## 2011-01-14 ENCOUNTER — Other Ambulatory Visit: Payer: Self-pay | Admitting: Obstetrics & Gynecology

## 2011-01-14 DIAGNOSIS — N971 Female infertility of tubal origin: Secondary | ICD-10-CM

## 2011-03-04 ENCOUNTER — Ambulatory Visit (HOSPITAL_COMMUNITY): Payer: BC Managed Care – PPO

## 2011-03-07 LAB — HM PAP SMEAR

## 2011-04-07 LAB — HM COLONOSCOPY

## 2011-05-07 ENCOUNTER — Ambulatory Visit (HOSPITAL_COMMUNITY)
Admission: RE | Admit: 2011-05-07 | Discharge: 2011-05-07 | Disposition: A | Discharge status: Home / Self Care | Payer: BC Managed Care – PPO | Source: Ambulatory Visit | Attending: Obstetrics & Gynecology | Admitting: Obstetrics & Gynecology

## 2011-05-07 DIAGNOSIS — N971 Female infertility of tubal origin: Secondary | ICD-10-CM

## 2011-05-07 DIAGNOSIS — Z3049 Encounter for surveillance of other contraceptives: Secondary | ICD-10-CM | POA: Insufficient documentation

## 2011-05-07 MED ORDER — IOHEXOL 300 MG/ML  SOLN: 10.0000 mL | Freq: Once | INTRAMUSCULAR | Status: AC | PRN

## 2011-05-21 LAB — CBC
HCT: 42 %
MCH: 29.6
MCV: 87.2 fL
RDW: 13.2
platelet count: 326

## 2011-06-26 ENCOUNTER — Ambulatory Visit (HOSPITAL_COMMUNITY)
Admission: RE | Admit: 2011-06-26 | Payer: BC Managed Care – PPO | Source: Ambulatory Visit | Admitting: Obstetrics & Gynecology

## 2011-06-26 ENCOUNTER — Encounter (HOSPITAL_COMMUNITY): Admission: RE | Payer: Self-pay | Source: Ambulatory Visit

## 2011-06-26 SURGERY — LIGATION, FALLOPIAN TUBE, LAPAROSCOPIC
Anesthesia: Choice

## 2011-09-07 ENCOUNTER — Other Ambulatory Visit (INDEPENDENT_AMBULATORY_CARE_PROVIDER_SITE_OTHER): Payer: BC Managed Care – PPO

## 2011-09-07 ENCOUNTER — Ambulatory Visit (INDEPENDENT_AMBULATORY_CARE_PROVIDER_SITE_OTHER): Payer: BC Managed Care – PPO | Admitting: Internal Medicine

## 2011-09-07 ENCOUNTER — Encounter: Payer: Self-pay | Admitting: Internal Medicine

## 2011-09-07 VITALS — BP 114/80 | HR 82 | Temp 98.5°F | Resp 16 | Ht 65.0 in | Wt 205.2 lb

## 2011-09-07 DIAGNOSIS — I10 Essential (primary) hypertension: Secondary | ICD-10-CM | POA: Insufficient documentation

## 2011-09-07 DIAGNOSIS — Z0000 Encounter for general adult medical examination without abnormal findings: Secondary | ICD-10-CM

## 2011-09-07 DIAGNOSIS — Z23 Encounter for immunization: Secondary | ICD-10-CM

## 2011-09-07 LAB — URINALYSIS, ROUTINE W REFLEX MICROSCOPIC
Bilirubin Urine: NEGATIVE
Ketones, ur: NEGATIVE
Nitrite: NEGATIVE
Specific Gravity, Urine: 1.02 (ref 1.000–1.030)
pH: 6.5 (ref 5.0–8.0)

## 2011-09-07 LAB — CBC WITH DIFFERENTIAL/PLATELET
Basophils Relative: 0.6 % (ref 0.0–3.0)
Eosinophils Relative: 1.4 % (ref 0.0–5.0)
HCT: 41 % (ref 36.0–46.0)
Hemoglobin: 13.7 g/dL (ref 12.0–15.0)
Lymphs Abs: 2.1 10*3/uL (ref 0.7–4.0)
MCV: 85.4 fl (ref 78.0–100.0)
Monocytes Absolute: 0.4 10*3/uL (ref 0.1–1.0)
Monocytes Relative: 8.3 % (ref 3.0–12.0)
Neutro Abs: 1.9 10*3/uL (ref 1.4–7.7)
RBC: 4.81 Mil/uL (ref 3.87–5.11)
WBC: 4.6 10*3/uL (ref 4.5–10.5)

## 2011-09-07 LAB — LIPID PANEL
HDL: 39.1 mg/dL (ref >39.00)
Total CHOL/HDL Ratio: 3
Triglycerides: 147 mg/dL (ref 0.0–149.0)

## 2011-09-07 LAB — COMPREHENSIVE METABOLIC PANEL
Alkaline Phosphatase: 55 U/L (ref 39–117)
CO2: 24 mEq/L (ref 19–32)
Creatinine, Ser: 0.7 mg/dL (ref 0.4–1.2)
GFR: 115.17 mL/min (ref >60.00)
Glucose, Bld: 121 mg/dL — ABNORMAL HIGH (ref 70–99)
Sodium: 139 mEq/L (ref 135–145)
Total Bilirubin: 0.6 mg/dL (ref 0.3–1.2)
Total Protein: 7.4 g/dL (ref 6.0–8.3)

## 2011-09-07 MED ORDER — FLUCONAZOLE 150 MG PO TABS: 150.0000 mg | ORAL_TABLET | Freq: Once | ORAL | Status: AC

## 2011-09-07 NOTE — Progress Notes (Signed)
  Subjective:    Patient ID: Stacey Ruiz, female    DOB: Feb 01, 1976, 36 y.o.   MRN: [id number]  HPI New to me for a physical, she feels well and offers no complaints.   Review of Systems  Constitutional: Negative for fever, chills, diaphoresis, activity change, appetite change, fatigue and unexpected weight change.  HENT: Negative.   Eyes: Negative.   Respiratory: Negative for apnea, cough, chest tightness, shortness of breath, wheezing and stridor.   Cardiovascular: Negative for chest pain, palpitations and leg swelling.  Gastrointestinal: Negative for nausea, vomiting, abdominal pain, diarrhea, constipation and anal bleeding.  Genitourinary: Negative.   Musculoskeletal: Negative for myalgias, back pain, joint swelling, arthralgias and gait problem.  Skin: Negative for color change, pallor, rash and wound.  Neurological: Negative.   Hematological: Negative for adenopathy. Does not bruise/bleed easily.  Psychiatric/Behavioral: Negative.        Objective:   Physical Exam  Vitals reviewed. Constitutional: She is oriented to person, place, and time. She appears well-developed and well-nourished. No distress.  HENT:  Head: Normocephalic and atraumatic.  Mouth/Throat: Oropharynx is clear and moist. No oropharyngeal exudate.  Eyes: Conjunctivae are normal. Right eye exhibits no discharge. Left eye exhibits no discharge. No scleral icterus.  Neck: Normal range of motion. Neck supple. No JVD present. No tracheal deviation present. No thyromegaly present.  Cardiovascular: Normal rate, regular rhythm, normal heart sounds and intact distal pulses.  Exam reveals no gallop and no friction rub.   No murmur heard. Pulmonary/Chest: Effort normal and breath sounds normal. No stridor. No respiratory distress. She has no wheezes. She has no rales. She exhibits no tenderness.  Abdominal: Soft. Bowel sounds are normal. She exhibits no distension and no mass. There is no tenderness. There is no  rebound and no guarding.  Musculoskeletal: Normal range of motion. She exhibits no edema and no tenderness.  Lymphadenopathy:    She has no cervical adenopathy.  Neurological: She is oriented to person, place, and time.  Skin: Skin is warm and dry. No rash noted. She is not diaphoretic. No erythema. No pallor.  Psychiatric: She has a normal mood and affect. Her behavior is normal. Judgment and thought content normal.     Lab Results  Component Value Date   WBC 7.6 05/20/2011   HGB 14.1 05/20/2011   HCT 42 05/20/2011   PLT 275 11/28/2010   GLUCOSE 93 11/28/2010   NA 137 11/28/2010   K 3.5 11/28/2010   CL 99 11/28/2010   CREATININE 0.74 11/28/2010   BUN 10 11/28/2010   CO2 29 11/28/2010   TSH 1.689 05/20/2011   INR 0.9 05/20/2011       Assessment & Plan:

## 2011-09-07 NOTE — Patient Instructions (Signed)
Preventive Care for Adults, Female A healthy lifestyle and preventive care can promote health and wellness. Preventive health guidelines for women include the following key practices.  A routine yearly physical is a good way to check with your caregiver about your health and preventive screening. It is a chance to share any concerns and updates on your health, and to receive a thorough exam.   Visit your dentist for a routine exam and preventive care every 6 months. Brush your teeth twice a day and floss once a day. Good oral hygiene prevents tooth decay and gum disease.   The frequency of eye exams is based on your age, health, family medical history, use of contact lenses, and other factors. Follow your caregiver's recommendations for frequency of eye exams.   Eat a healthy diet. Foods like vegetables, fruits, whole grains, low-fat dairy products, and lean protein foods contain the nutrients you need without too many calories. Decrease your intake of foods high in solid fats, added sugars, and salt. Eat the right amount of calories for you.Get information about a proper diet from your caregiver, if necessary.   Regular physical exercise is one of the most important things you can do for your health. Most adults should get at least 150 minutes of moderate-intensity exercise (any activity that increases your heart rate and causes you to sweat) each week. In addition, most adults need muscle-strengthening exercises on 2 or more days a week.   Maintain a healthy weight. The body mass index (BMI) is a screening tool to identify possible weight problems. It provides an estimate of body fat based on height and weight. Your caregiver can help determine your BMI, and can help you achieve or maintain a healthy weight.For adults 20 years and older:   A BMI below 18.5 is considered underweight.   A BMI of 18.5 to 24.9 is normal.   A BMI of 25 to 29.9 is considered overweight.   A BMI of 30 and above is  considered obese.   Maintain normal blood lipids and cholesterol levels by exercising and minimizing your intake of saturated fat. Eat a balanced diet with plenty of fruit and vegetables. Blood tests for lipids and cholesterol should begin at age 20 and be repeated every 5 years. If your lipid or cholesterol levels are high, you are over 50, or you are at high risk for heart disease, you may need your cholesterol levels checked more frequently.Ongoing high lipid and cholesterol levels should be treated with medicines if diet and exercise are not effective.   If you smoke, find out from your caregiver how to quit. If you do not use tobacco, do not start.   If you are pregnant, do not drink alcohol. If you are breastfeeding, be very cautious about drinking alcohol. If you are not pregnant and choose to drink alcohol, do not exceed 1 drink per day. One drink is considered to be 12 ounces (355 mL) of beer, 5 ounces (148 mL) of wine, or 1.5 ounces (44 mL) of liquor.   Avoid use of street drugs. Do not share needles with anyone. Ask for help if you need support or instructions about stopping the use of drugs.   High blood pressure causes heart disease and increases the risk of stroke. Your blood pressure should be checked at least every 1 to 2 years. Ongoing high blood pressure should be treated with medicines if weight loss and exercise are not effective.   If you are 55 to 37   years old, ask your caregiver if you should take aspirin to prevent strokes.   Diabetes screening involves taking a blood sample to check your fasting blood sugar level. This should be done once every 3 years, after age 45, if you are within normal weight and without risk factors for diabetes. Testing should be considered at a younger age or be carried out more frequently if you are overweight and have at least 1 risk factor for diabetes.   Breast cancer screening is essential preventive care for women. You should practice "breast  self-awareness." This means understanding the normal appearance and feel of your breasts and may include breast self-examination. Any changes detected, no matter how small, should be reported to a caregiver. Women in their 20s and 30s should have a clinical breast exam (CBE) by a caregiver as part of a regular health exam every 1 to 3 years. After age 40, women should have a CBE every year. Starting at age 40, women should consider having a mammography (breast X-ray test) every year. Women who have a family history of breast cancer should talk to their caregiver about genetic screening. Women at a high risk of breast cancer should talk to their caregivers about having magnetic resonance imaging (MRI) and a mammography every year.   The Pap test is a screening test for cervical cancer. A Pap test can show cell changes on the cervix that might become cervical cancer if left untreated. A Pap test is a procedure in which cells are obtained and examined from the lower end of the uterus (cervix).   Women should have a Pap test starting at age 21.   Between ages 21 and 29, Pap tests should be repeated every 2 years.   Beginning at age 30, you should have a Pap test every 3 years as long as the past 3 Pap tests have been normal.   Some women have medical problems that increase the chance of getting cervical cancer. Talk to your caregiver about these problems. It is especially important to talk to your caregiver if a new problem develops soon after your last Pap test. In these cases, your caregiver may recommend more frequent screening and Pap tests.   The above recommendations are the same for women who have or have not gotten the vaccine for human papillomavirus (HPV).   If you had a hysterectomy for a problem that was not cancer or a condition that could lead to cancer, then you no longer need Pap tests. Even if you no longer need a Pap test, a regular exam is a good idea to make sure no other problems are  starting.   If you are between ages 65 and 70, and you have had normal Pap tests going back 10 years, you no longer need Pap tests. Even if you no longer need a Pap test, a regular exam is a good idea to make sure no other problems are starting.   If you have had past treatment for cervical cancer or a condition that could lead to cancer, you need Pap tests and screening for cancer for at least 20 years after your treatment.   If Pap tests have been discontinued, risk factors (such as a new sexual partner) need to be reassessed to determine if screening should be resumed.   The HPV test is an additional test that may be used for cervical cancer screening. The HPV test looks for the virus that can cause the cell changes on the cervix.   The cells collected during the Pap test can be tested for HPV. The HPV test could be used to screen women aged 30 years and older, and should be used in women of any age who have unclear Pap test results. After the age of 30, women should have HPV testing at the same frequency as a Pap test.   Colorectal cancer can be detected and often prevented. Most routine colorectal cancer screening begins at the age of 50 and continues through age 75. However, your caregiver may recommend screening at an earlier age if you have risk factors for colon cancer. On a yearly basis, your caregiver may provide home test kits to check for hidden blood in the stool. Use of a small camera at the end of a tube, to directly examine the colon (sigmoidoscopy or colonoscopy), can detect the earliest forms of colorectal cancer. Talk to your caregiver about this at age 50, when routine screening begins. Direct examination of the colon should be repeated every 5 to 10 years through age 75, unless early forms of pre-cancerous polyps or small growths are found.   Hepatitis C blood testing is recommended for all people born from 1945 through 1965 and any individual with known risks for hepatitis C.    Practice safe sex. Use condoms and avoid high-risk sexual practices to reduce the spread of sexually transmitted infections (STIs). STIs include gonorrhea, chlamydia, syphilis, trichomonas, herpes, HPV, and human immunodeficiency virus (HIV). Herpes, HIV, and HPV are viral illnesses that have no cure. They can result in disability, cancer, and death. Sexually active women aged 25 and younger should be checked for chlamydia. Older women with new or multiple partners should also be tested for chlamydia. Testing for other STIs is recommended if you are sexually active and at increased risk.   Osteoporosis is a disease in which the bones lose minerals and strength with aging. This can result in serious bone fractures. The risk of osteoporosis can be identified using a bone density scan. Women ages 65 and over and women at risk for fractures or osteoporosis should discuss screening with their caregivers. Ask your caregiver whether you should take a calcium supplement or vitamin D to reduce the rate of osteoporosis.   Menopause can be associated with physical symptoms and risks. Hormone replacement therapy is available to decrease symptoms and risks. You should talk to your caregiver about whether hormone replacement therapy is right for you.   Use sunscreen with sun protection factor (SPF) of 30 or more. Apply sunscreen liberally and repeatedly throughout the day. You should seek shade when your shadow is shorter than you. Protect yourself by wearing long sleeves, pants, a wide-brimmed hat, and sunglasses year round, whenever you are outdoors.   Once a month, do a whole body skin exam, using a mirror to look at the skin on your back. Notify your caregiver of new moles, moles that have irregular borders, moles that are larger than a pencil eraser, or moles that have changed in shape or color.   Stay current with required immunizations.   Influenza. You need a dose every fall (or winter). The composition of  the flu vaccine changes each year, so being vaccinated once is not enough.   Pneumococcal polysaccharide. You need 1 to 2 doses if you smoke cigarettes or if you have certain chronic medical conditions. You need 1 dose at age 65 (or older) if you have never been vaccinated.   Tetanus, diphtheria, pertussis (Tdap, Td). Get 1 dose of   Tdap vaccine if you are younger than age 65, are over 65 and have contact with an infant, are a healthcare worker, are pregnant, or simply want to be protected from whooping cough. After that, you need a Td booster dose every 10 years. Consult your caregiver if you have not had at least 3 tetanus and diphtheria-containing shots sometime in your life or have a deep or dirty wound.   HPV. You need this vaccine if you are a woman age 26 or younger. The vaccine is given in 3 doses over 6 months.   Measles, mumps, rubella (MMR). You need at least 1 dose of MMR if you were born in 1957 or later. You may also need a second dose.   Meningococcal. If you are age 19 to 21 and a first-year college student living in a residence hall, or have one of several medical conditions, you need to get vaccinated against meningococcal disease. You may also need additional booster doses.   Zoster (shingles). If you are age 60 or older, you should get this vaccine.   Varicella (chickenpox). If you have never had chickenpox or you were vaccinated but received only 1 dose, talk to your caregiver to find out if you need this vaccine.   Hepatitis A. You need this vaccine if you have a specific risk factor for hepatitis A virus infection or you simply wish to be protected from this disease. The vaccine is usually given as 2 doses, 6 to 18 months apart.   Hepatitis B. You need this vaccine if you have a specific risk factor for hepatitis B virus infection or you simply wish to be protected from this disease. The vaccine is given in 3 doses, usually over 6 months.  Preventive Services /  Frequency Ages 19 to 39  Blood pressure check.** / Every 1 to 2 years.   Lipid and cholesterol check.** / Every 5 years beginning at age 20.   Clinical breast exam.** / Every 3 years for women in their 20s and 30s.   Pap test.** / Every 2 years from ages 21 through 29. Every 3 years starting at age 30 through age 65 or 70 with a history of 3 consecutive normal Pap tests.   HPV screening.** / Every 3 years from ages 30 through ages 65 to 70 with a history of 3 consecutive normal Pap tests.   Hepatitis C blood test.** / For any individual with known risks for hepatitis C.   Skin self-exam. / Monthly.   Influenza immunization.** / Every year.   Pneumococcal polysaccharide immunization.** / 1 to 2 doses if you smoke cigarettes or if you have certain chronic medical conditions.   Tetanus, diphtheria, pertussis (Tdap, Td) immunization. / A one-time dose of Tdap vaccine. After that, you need a Td booster dose every 10 years.   HPV immunization. / 3 doses over 6 months, if you are 26 and younger.   Measles, mumps, rubella (MMR) immunization. / You need at least 1 dose of MMR if you were born in 1957 or later. You may also need a second dose.   Meningococcal immunization. / 1 dose if you are age 19 to 21 and a first-year college student living in a residence hall, or have one of several medical conditions, you need to get vaccinated against meningococcal disease. You may also need additional booster doses.   Varicella immunization.** / Consult your caregiver.   Hepatitis A immunization.** / Consult your caregiver. 2 doses, 6 to 18 months   apart.   Hepatitis B immunization.** / Consult your caregiver. 3 doses usually over 6 months.  Ages 40 to 64  Blood pressure check.** / Every 1 to 2 years.   Lipid and cholesterol check.** / Every 5 years beginning at age 20.   Clinical breast exam.** / Every year after age 40.   Mammogram.** / Every year beginning at age 40 and continuing for as  long as you are in good health. Consult with your caregiver.   Pap test.** / Every 3 years starting at age 30 through age 65 or 70 with a history of 3 consecutive normal Pap tests.   HPV screening.** / Every 3 years from ages 30 through ages 65 to 70 with a history of 3 consecutive normal Pap tests.   Fecal occult blood test (FOBT) of stool. / Every year beginning at age 50 and continuing until age 75. You may not need to do this test if you get a colonoscopy every 10 years.   Flexible sigmoidoscopy or colonoscopy.** / Every 5 years for a flexible sigmoidoscopy or every 10 years for a colonoscopy beginning at age 50 and continuing until age 75.   Hepatitis C blood test.** / For all people born from 1945 through 1965 and any individual with known risks for hepatitis C.   Skin self-exam. / Monthly.   Influenza immunization.** / Every year.   Pneumococcal polysaccharide immunization.** / 1 to 2 doses if you smoke cigarettes or if you have certain chronic medical conditions.   Tetanus, diphtheria, pertussis (Tdap, Td) immunization.** / A one-time dose of Tdap vaccine. After that, you need a Td booster dose every 10 years.   Measles, mumps, rubella (MMR) immunization. / You need at least 1 dose of MMR if you were born in 1957 or later. You may also need a second dose.   Varicella immunization.** / Consult your caregiver.   Meningococcal immunization.** / Consult your caregiver.   Hepatitis A immunization.** / Consult your caregiver. 2 doses, 6 to 18 months apart.   Hepatitis B immunization.** / Consult your caregiver. 3 doses, usually over 6 months.  Ages 65 and over  Blood pressure check.** / Every 1 to 2 years.   Lipid and cholesterol check.** / Every 5 years beginning at age 20.   Clinical breast exam.** / Every year after age 40.   Mammogram.** / Every year beginning at age 40 and continuing for as long as you are in good health. Consult with your caregiver.   Pap test.** /  Every 3 years starting at age 30 through age 65 or 70 with a 3 consecutive normal Pap tests. Testing can be stopped between 65 and 70 with 3 consecutive normal Pap tests and no abnormal Pap or HPV tests in the past 10 years.   HPV screening.** / Every 3 years from ages 30 through ages 65 or 70 with a history of 3 consecutive normal Pap tests. Testing can be stopped between 65 and 70 with 3 consecutive normal Pap tests and no abnormal Pap or HPV tests in the past 10 years.   Fecal occult blood test (FOBT) of stool. / Every year beginning at age 50 and continuing until age 75. You may not need to do this test if you get a colonoscopy every 10 years.   Flexible sigmoidoscopy or colonoscopy.** / Every 5 years for a flexible sigmoidoscopy or every 10 years for a colonoscopy beginning at age 50 and continuing until age 75.   Hepatitis   C blood test.** / For all people born from 1945 through 1965 and any individual with known risks for hepatitis C.   Osteoporosis screening.** / A one-time screening for women ages 65 and over and women at risk for fractures or osteoporosis.   Skin self-exam. / Monthly.   Influenza immunization.** / Every year.   Pneumococcal polysaccharide immunization.** / 1 dose at age 65 (or older) if you have never been vaccinated.   Tetanus, diphtheria, pertussis (Tdap, Td) immunization. / A one-time dose of Tdap vaccine if you are over 65 and have contact with an infant, are a healthcare worker, or simply want to be protected from whooping cough. After that, you need a Td booster dose every 10 years.   Varicella immunization.** / Consult your caregiver.   Meningococcal immunization.** / Consult your caregiver.   Hepatitis A immunization.** / Consult your caregiver. 2 doses, 6 to 18 months apart.   Hepatitis B immunization.** / Check with your caregiver. 3 doses, usually over 6 months.  ** Family history and personal history of risk and conditions may change your caregiver's  recommendations. Document Released: 05/19/2001 Document Revised: 03/12/2011 Document Reviewed: 08/18/2010 [employer] Patient Information 2012 [employer]. 

## 2011-09-08 NOTE — Assessment & Plan Note (Signed)
Exam done, labs ordered, vaccines were updated, pt ed material was given 

## 2011-09-17 ENCOUNTER — Telehealth: Payer: Self-pay | Admitting: Internal Medicine

## 2011-09-17 ENCOUNTER — Encounter: Payer: Self-pay | Admitting: Internal Medicine

## 2011-09-17 ENCOUNTER — Ambulatory Visit (INDEPENDENT_AMBULATORY_CARE_PROVIDER_SITE_OTHER)
Admission: RE | Admit: 2011-09-17 | Discharge: 2011-09-17 | Disposition: A | Discharge status: Home / Self Care | Payer: BC Managed Care – PPO | Source: Ambulatory Visit | Attending: Internal Medicine | Admitting: Internal Medicine

## 2011-09-17 ENCOUNTER — Ambulatory Visit (INDEPENDENT_AMBULATORY_CARE_PROVIDER_SITE_OTHER): Payer: BC Managed Care – PPO | Admitting: Internal Medicine

## 2011-09-17 ENCOUNTER — Other Ambulatory Visit (INDEPENDENT_AMBULATORY_CARE_PROVIDER_SITE_OTHER): Payer: BC Managed Care – PPO

## 2011-09-17 VITALS — BP 108/70 | HR 94 | Temp 98.6°F | Resp 16 | Wt 201.0 lb

## 2011-09-17 DIAGNOSIS — R7309 Other abnormal glucose: Secondary | ICD-10-CM | POA: Insufficient documentation

## 2011-09-17 DIAGNOSIS — R059 Cough, unspecified: Secondary | ICD-10-CM

## 2011-09-17 DIAGNOSIS — J209 Acute bronchitis, unspecified: Secondary | ICD-10-CM | POA: Insufficient documentation

## 2011-09-17 DIAGNOSIS — R05 Cough: Secondary | ICD-10-CM

## 2011-09-17 LAB — BASIC METABOLIC PANEL
BUN: 7 mg/dL (ref 6–23)
CO2: 33 mEq/L — ABNORMAL HIGH (ref 19–32)
Calcium: 9.6 mg/dL (ref 8.4–10.5)
Chloride: 100 mEq/L (ref 96–112)
Creatinine, Ser: 0.9 mg/dL (ref 0.4–1.2)
Glucose, Bld: 77 mg/dL (ref 70–99)

## 2011-09-17 LAB — HEMOGLOBIN A1C: Hgb A1c MFr Bld: 5.9 % (ref 4.6–6.5)

## 2011-09-17 MED ORDER — HYDROCODONE-HOMATROPINE 5-1.5 MG/5ML PO SYRP: 5.0000 mL | ORAL_SOLUTION | Freq: Three times a day (TID) | ORAL | Status: AC | PRN

## 2011-09-17 MED ORDER — SULFAMETHOXAZOLE-TRIMETHOPRIM 800-160 MG PO TABS: 1.0000 | ORAL_TABLET | Freq: Two times a day (BID) | ORAL | Status: AC

## 2011-09-17 NOTE — Progress Notes (Signed)
  Subjective:    Patient ID: Stacey Ruiz, female    DOB: 1975-05-16, 36 y.o.   MRN: [id number]  Cough This is a new problem. The current episode started in the past 7 days. The problem has been gradually worsening. The problem occurs every few hours. The cough is productive of purulent sputum. Associated symptoms include chills and a sore throat. Pertinent negatives include no chest pain, ear congestion, ear pain, fever, headaches, heartburn, hemoptysis, myalgias, nasal congestion, postnasal drip, rash, rhinorrhea, shortness of breath, sweats, weight loss or wheezing. Nothing aggravates the symptoms. She has tried nothing for the symptoms.      Review of Systems  Constitutional: Positive for chills. Negative for fever, weight loss, diaphoresis, activity change, appetite change, fatigue and unexpected weight change.  HENT: Positive for sore throat. Negative for ear pain, rhinorrhea and postnasal drip.   Eyes: Negative.   Respiratory: Positive for cough. Negative for apnea, hemoptysis, choking, shortness of breath, wheezing and stridor.   Cardiovascular: Negative for chest pain, palpitations and leg swelling.  Gastrointestinal: Negative.  Negative for heartburn.  Genitourinary: Negative.   Musculoskeletal: Negative for myalgias, back pain, joint swelling, arthralgias and gait problem.  Skin: Negative for color change, pallor, rash and wound.  Neurological: Negative for dizziness, tremors, seizures, syncope, facial asymmetry, speech difficulty, weakness, light-headedness, numbness and headaches.  Hematological: Negative for adenopathy. Does not bruise/bleed easily.  Psychiatric/Behavioral: Negative.        Objective:   Physical Exam  Vitals reviewed. Constitutional: She is oriented to person, place, and time. She appears well-developed and well-nourished. No distress.  HENT:  Head: Normocephalic and atraumatic.  Mouth/Throat: Oropharynx is clear and moist. No oropharyngeal exudate.    Eyes: Conjunctivae are normal. Right eye exhibits no discharge. Left eye exhibits no discharge. No scleral icterus.  Neck: Normal range of motion. Neck supple. No JVD present. No tracheal deviation present. No thyromegaly present.  Cardiovascular: Normal rate, regular rhythm, normal heart sounds and intact distal pulses.  Exam reveals no gallop and no friction rub.   No murmur heard. Pulmonary/Chest: Effort normal and breath sounds normal. No stridor. No respiratory distress. She has no wheezes. She has no rales. She exhibits no tenderness.  Abdominal: Soft. Bowel sounds are normal. She exhibits no distension and no mass. There is no tenderness. There is no rebound and no guarding.  Musculoskeletal: Normal range of motion. She exhibits no edema and no tenderness.  Lymphadenopathy:    She has no cervical adenopathy.  Neurological: She is oriented to person, place, and time.  Skin: Skin is warm and dry. No rash noted. She is not diaphoretic. No erythema. No pallor.  Psychiatric: She has a normal mood and affect. Her behavior is normal. Judgment and thought content normal.      Lab Results  Component Value Date   WBC 4.6 09/07/2011   HGB 13.7 09/07/2011   HCT 41.0 09/07/2011   PLT 268.0 09/07/2011   GLUCOSE 121* 09/07/2011   CHOL 129 09/07/2011   TRIG 147.0 09/07/2011   HDL 39.10 09/07/2011   LDLCALC 61 09/07/2011   ALT 20 09/07/2011   AST 22 09/07/2011   NA 139 09/07/2011   K 4.3 09/07/2011   CL 107 09/07/2011   CREATININE 0.7 09/07/2011   BUN 7 09/07/2011   CO2 24 09/07/2011   TSH 1.27 09/07/2011   INR 0.9 05/20/2011      Assessment & Plan:

## 2011-09-17 NOTE — Telephone Encounter (Signed)
Caller: Torah/Mother; PCP: Sanda Linger; CB#: [phone]; ; ; Call regarding Cough/Congestion; Afebrile  Onset- 09/10/11 Pt c/o of   coughing , sneezing, sore throat that is causing difficulty swallowing, ear pain and congestion. Emergent s/s of sore throat protocol r/o. Pt to seee provider within 4 hrs. No appts in [facility]. Message sent. Pt aware she will get a call back.

## 2011-09-17 NOTE — Patient Instructions (Signed)

## 2011-09-17 NOTE — Telephone Encounter (Signed)
Coming at 145 today 

## 2011-09-17 NOTE — Telephone Encounter (Signed)
Work her in today  

## 2011-09-17 NOTE — Telephone Encounter (Signed)
Please advise, last OV 09/07/2011.

## 2011-09-18 NOTE — Assessment & Plan Note (Signed)
I will check her a1c to see if she has DM II 

## 2011-09-18 NOTE — Assessment & Plan Note (Signed)
I will check her CXR to see if she has pna 

## 2011-09-18 NOTE — Assessment & Plan Note (Signed)
Start septra for the infection and a cough suppressant

## 2011-09-28 ENCOUNTER — Other Ambulatory Visit: Payer: Self-pay

## 2011-09-28 MED ORDER — TRIAMTERENE-HCTZ 37.5-25 MG PO TABS: 1.0000 | ORAL_TABLET | Freq: Every day | ORAL | Status: DC

## 2011-10-07 ENCOUNTER — Telehealth: Payer: Self-pay

## 2011-10-07 NOTE — Telephone Encounter (Signed)
Pt called requesting Rx to treat symptoms of anxiety. Pt says that this was discussed at her new patient OV, please advise.

## 2011-10-07 NOTE — Telephone Encounter (Signed)
Pt advised and transferred to schedule appt.  

## 2011-10-07 NOTE — Telephone Encounter (Signed)
There is no info on anxiety from her OV, please ask her to follow up with me

## 2011-10-12 ENCOUNTER — Ambulatory Visit (INDEPENDENT_AMBULATORY_CARE_PROVIDER_SITE_OTHER): Payer: BC Managed Care – PPO | Admitting: Internal Medicine

## 2011-10-12 ENCOUNTER — Encounter: Payer: Self-pay | Admitting: Internal Medicine

## 2011-10-12 VITALS — BP 112/70 | HR 80 | Temp 98.8°F | Resp 16 | Wt 204.0 lb

## 2011-10-12 DIAGNOSIS — F40243 Fear of flying: Secondary | ICD-10-CM | POA: Insufficient documentation

## 2011-10-12 DIAGNOSIS — F418 Other specified anxiety disorders: Secondary | ICD-10-CM

## 2011-10-12 DIAGNOSIS — F341 Dysthymic disorder: Secondary | ICD-10-CM

## 2011-10-12 DIAGNOSIS — I10 Essential (primary) hypertension: Secondary | ICD-10-CM

## 2011-10-12 MED ORDER — DULOXETINE HCL 30 MG PO CPEP: 30.0000 mg | ORAL_CAPSULE | Freq: Every day | ORAL | Status: DC

## 2011-10-12 NOTE — Patient Instructions (Signed)

## 2011-10-12 NOTE — Assessment & Plan Note (Signed)
Her BP is well controlled 

## 2011-10-12 NOTE — Assessment & Plan Note (Signed)
Start cymbalta, she will consider starting psychotherapy, she was given pt ed material was well

## 2011-10-12 NOTE — Progress Notes (Signed)
Subjective:    Patient ID: Stacey Ruiz, female    DOB: [birth year], 36 y.o.   MRN: [id number]  Anxiety Presents for initial visit. Onset was 1 to 6 months ago. The problem has been unchanged. Symptoms include decreased concentration, depressed mood, irritability and malaise. Patient reports no chest pain, compulsions, confusion, dizziness, dry mouth, excessive worry, feeling of choking, hyperventilation, insomnia, nausea, nervous/anxious behavior, obsessions, palpitations, panic, restlessness, shortness of breath or suicidal ideas. Symptoms occur occasionally. The severity of symptoms is mild. The symptoms are aggravated by family issues and work stress. The patient sleeps 7 hours per night. The quality of sleep is good. Nighttime awakenings: none.        Review of Systems  Constitutional: Positive for irritability and fatigue. Negative for fever, chills, diaphoresis, activity change, appetite change and unexpected weight change.  HENT: Negative.   Eyes: Negative.   Respiratory: Negative.  Negative for shortness of breath.   Cardiovascular: Negative.  Negative for chest pain and palpitations.  Gastrointestinal: Negative.  Negative for nausea.  Genitourinary: Negative.   Musculoskeletal: Negative.   Skin: Negative.   Neurological: Negative.  Negative for dizziness.  Hematological: Negative.   Psychiatric/Behavioral: Positive for dysphoric mood (anhedonia, irritable, angry) and decreased concentration. Negative for suicidal ideas, hallucinations, behavioral problems, confusion, disturbed wake/sleep cycle, self-injury and agitation. The patient is not nervous/anxious, does not have insomnia and is not hyperactive.        Objective:   Physical Exam  Vitals reviewed. Constitutional: She is oriented to person, place, and time. She appears well-developed and well-nourished. No distress.  HENT:  Head: Normocephalic and atraumatic.  Mouth/Throat: Oropharynx is clear and moist. No  oropharyngeal exudate.  Eyes: Conjunctivae are normal. Right eye exhibits no discharge. Left eye exhibits no discharge. No scleral icterus.  Neck: Normal range of motion. Neck supple. No JVD present. No tracheal deviation present. No thyromegaly present.  Cardiovascular: Normal rate, regular rhythm, normal heart sounds and intact distal pulses.  Exam reveals no gallop and no friction rub.   No murmur heard. Pulmonary/Chest: Effort normal and breath sounds normal. No stridor. No respiratory distress. She has no wheezes. She has no rales. She exhibits no tenderness.  Abdominal: Soft. Bowel sounds are normal. She exhibits no distension and no mass. There is no tenderness. There is no rebound and no guarding.  Musculoskeletal: Normal range of motion. She exhibits no edema and no tenderness.  Lymphadenopathy:    She has no cervical adenopathy.  Neurological: She is oriented to person, place, and time.  Skin: Skin is warm and dry. No rash noted. She is not diaphoretic. No erythema. No pallor.  Psychiatric: Her behavior is normal. Judgment and thought content normal. Her mood appears anxious. Her affect is not angry, not blunt, not labile and not inappropriate. Her speech is not rapid and/or pressured, not delayed, not tangential and not slurred. She is not agitated, not aggressive, is not hyperactive, not slowed, not withdrawn, not actively hallucinating and not combative. Thought content is not paranoid and not delusional. Cognition and memory are normal. Cognition and memory are not impaired. She does not express impulsivity or inappropriate judgment. She exhibits a depressed mood (tearful). She expresses no homicidal and no suicidal ideation. She expresses no suicidal plans and no homicidal plans. She is communicative. She exhibits normal recent memory and normal remote memory. She is attentive.      Lab Results  Component Value Date   WBC 4.6 09/07/2011   HGB 13.7 09/07/2011  HCT 41.0 09/07/2011   PLT  268.0 09/07/2011   GLUCOSE 77 09/17/2011   CHOL 129 09/07/2011   TRIG 147.0 09/07/2011   HDL 39.10 09/07/2011   LDLCALC 61 09/07/2011   ALT 20 09/07/2011   AST 22 09/07/2011   NA 140 09/17/2011   K 3.9 09/17/2011   CL 100 09/17/2011   CREATININE 0.9 09/17/2011   BUN 7 09/17/2011   CO2 33* 09/17/2011   TSH 1.27 09/07/2011   INR 0.9 05/20/2011   HGBA1C 5.9 09/17/2011      Assessment & Plan:

## 2011-11-12 ENCOUNTER — Encounter: Payer: Self-pay | Admitting: Internal Medicine

## 2011-11-12 ENCOUNTER — Ambulatory Visit (INDEPENDENT_AMBULATORY_CARE_PROVIDER_SITE_OTHER): Payer: BC Managed Care – PPO | Admitting: Internal Medicine

## 2011-11-12 VITALS — BP 118/78 | HR 99 | Temp 99.6°F | Resp 18 | Wt 201.5 lb

## 2011-11-12 DIAGNOSIS — J020 Streptococcal pharyngitis: Secondary | ICD-10-CM

## 2011-11-12 MED ORDER — PENICILLIN G BENZATHINE 1200000 UNIT/2ML IM SUSP
2.4000 10*6.[IU] | Freq: Once | INTRAMUSCULAR | Status: AC
  Administered 2011-11-12: 2.4 10*6.[IU] via INTRAMUSCULAR

## 2011-11-12 NOTE — Assessment & Plan Note (Signed)
She wanted to get an injection to treat this so I gave her Bicillin-LA

## 2011-11-12 NOTE — Patient Instructions (Signed)

## 2011-11-12 NOTE — Progress Notes (Signed)
Subjective:    Patient ID: Stacey Ruiz, female    DOB: July 20, 1975, 36 y.o.   MRN: [id number]  Sore Throat  This is a new problem. The current episode started yesterday. The problem has been gradually worsening. Neither side of throat is experiencing more pain than the other. The maximum temperature recorded prior to her arrival was 100 - 100.9 F. The fever has been present for 1 to 2 days. The pain is at a severity of 1/10. The pain is mild. Associated symptoms include congestion and swollen glands. Pertinent negatives include no abdominal pain, coughing, diarrhea, drooling, ear discharge, ear pain, headaches, hoarse voice, plugged ear sensation, neck pain, shortness of breath, stridor, trouble swallowing or vomiting. She has had exposure to strep. She has tried nothing for the symptoms.      Review of Systems  Constitutional: Positive for fever and chills. Negative for diaphoresis, activity change, appetite change, fatigue and unexpected weight change.  HENT: Positive for congestion and sore throat. Negative for hearing loss, ear pain, nosebleeds, hoarse voice, facial swelling, rhinorrhea, sneezing, drooling, mouth sores, trouble swallowing, neck pain, neck stiffness, dental problem, voice change, postnasal drip, sinus pressure, tinnitus and ear discharge.   Eyes: Negative.   Respiratory: Negative for cough, chest tightness, shortness of breath and stridor.   Cardiovascular: Negative for chest pain, palpitations and leg swelling.  Gastrointestinal: Negative for vomiting, abdominal pain and diarrhea.  Genitourinary: Negative.   Musculoskeletal: Negative.   Skin: Negative for color change, pallor, rash and wound.  Neurological: Negative.  Negative for headaches.  Hematological: Positive for adenopathy. Does not bruise/bleed easily.  Psychiatric/Behavioral: Negative.        Objective:   Physical Exam  Vitals reviewed. Constitutional: She is oriented to person, place, and time. She  appears well-developed and well-nourished.  Non-toxic appearance. She does not have a sickly appearance. She does not appear ill. No distress.  HENT:  Head: Normocephalic and atraumatic. No trismus in the jaw.  Right Ear: Hearing, tympanic membrane, external ear and ear canal normal.  Left Ear: Hearing, tympanic membrane, external ear and ear canal normal.  Mouth/Throat: Uvula is midline and mucous membranes are normal. Mucous membranes are not pale, not dry and not cyanotic. No oral lesions. No uvula swelling. Posterior oropharyngeal erythema present. No oropharyngeal exudate, posterior oropharyngeal edema or tonsillar abscesses.  Eyes: Conjunctivae are normal. Right eye exhibits no discharge. Left eye exhibits no discharge. No scleral icterus.  Neck: Normal range of motion. Neck supple. No JVD present. No tracheal deviation present. No thyromegaly present.  Cardiovascular: Normal rate, regular rhythm, normal heart sounds and intact distal pulses.  Exam reveals no gallop and no friction rub.   No murmur heard. Pulmonary/Chest: Effort normal and breath sounds normal. No stridor. No respiratory distress. She has no wheezes. She has no rales. She exhibits no tenderness.  Abdominal: Soft. Bowel sounds are normal. She exhibits no distension and no mass. There is no tenderness. There is no rebound and no guarding.  Musculoskeletal: Normal range of motion. She exhibits no edema and no tenderness.  Lymphadenopathy:       Head (right side): No submental, no submandibular, no tonsillar, no preauricular, no posterior auricular and no occipital adenopathy present.       Head (left side): No submental, no submandibular, no tonsillar, no preauricular, no posterior auricular and no occipital adenopathy present.    She has cervical adenopathy.       Right cervical: Superficial cervical adenopathy present. No deep cervical  and no posterior cervical adenopathy present.      Left cervical: Superficial cervical  adenopathy present. No deep cervical and no posterior cervical adenopathy present.    She has no axillary adenopathy.  Neurological: She is oriented to person, place, and time.  Skin: Skin is warm and dry. No rash noted. She is not diaphoretic. No erythema. No pallor.  Psychiatric: She has a normal mood and affect. Her behavior is normal. Judgment and thought content normal.          Assessment & Plan:

## 2012-01-30 ENCOUNTER — Emergency Department (HOSPITAL_COMMUNITY)
Admission: EM | Admit: 2012-01-30 | Discharge: 2012-01-30 | Discharge status: Against Medical Advice | Payer: BC Managed Care – PPO | Attending: Emergency Medicine | Admitting: Emergency Medicine

## 2012-01-30 ENCOUNTER — Encounter (HOSPITAL_COMMUNITY): Payer: Self-pay | Admitting: Emergency Medicine

## 2012-01-30 DIAGNOSIS — Z87891 Personal history of nicotine dependence: Secondary | ICD-10-CM | POA: Insufficient documentation

## 2012-01-30 DIAGNOSIS — R51 Headache: Secondary | ICD-10-CM

## 2012-01-30 DIAGNOSIS — I10 Essential (primary) hypertension: Secondary | ICD-10-CM

## 2012-01-30 DIAGNOSIS — Z8659 Personal history of other mental and behavioral disorders: Secondary | ICD-10-CM | POA: Insufficient documentation

## 2012-01-30 DIAGNOSIS — Z79899 Other long term (current) drug therapy: Secondary | ICD-10-CM | POA: Insufficient documentation

## 2012-01-30 DIAGNOSIS — G43909 Migraine, unspecified, not intractable, without status migrainosus: Secondary | ICD-10-CM | POA: Insufficient documentation

## 2012-01-30 MED ORDER — SODIUM CHLORIDE 0.9 % IV BOLUS (SEPSIS)
1000.0000 mL | Freq: Once | INTRAVENOUS | Status: AC
  Administered 2012-01-30: 1000 mL via INTRAVENOUS

## 2012-01-30 MED ORDER — DEXAMETHASONE SODIUM PHOSPHATE 10 MG/ML IJ SOLN: 10.0000 mg | Freq: Once | INTRAMUSCULAR | Status: DC

## 2012-01-30 MED ORDER — ONDANSETRON HCL 4 MG/2ML IJ SOLN: 4.0000 mg | Freq: Once | INTRAMUSCULAR | Status: DC

## 2012-01-30 NOTE — ED Notes (Signed)
Pt. Stated, I 've had high BP and headaches and now with ear pain for 2 weeks.

## 2012-01-30 NOTE — ED Provider Notes (Signed)
History     CSN: [id number]  Arrival date & time 01/30/12  1149   First MD Initiated Contact with Patient 01/30/12 1214      Chief Complaint  Patient presents with  . Hypertension    (Consider location/radiation/quality/duration/timing/severity/associated sxs/prior treatment) HPI Comments: Stacey Ruiz presents with the history of elevated blood pressures when she checks them at her local pharmacy, most recently checked this morning and it was 170/100.  She also states she has been having new onset migraine headaches and currently has one which is been present since yesterday.  She has a distant history of headache when she was in teens and early 14s, which resolved until just recently.  She describes left-sided temporal and for head throbbing pain in association with photophobia, nausea and has had several episodes of dry heaves today.  She denies fevers or chills and has had no neck pain or stiffness.  She also denies shortness of breath, chest pain, visual disturbance, focal weakness or numbness or dizziness.  She has taken ibuprofen which has not relieved her headache symptoms.  She was taking triamterene/HCTZ for blood pressure control but not in recent months stating she has been unable to afford this medication.  She also reports bilateral ear ache for the past several weeks.  She's had no drainage from her ears and again, has been afebrile.  She's had no nasal congestion, sore throat.  The history is provided by the patient.    Past Medical History  Diagnosis Date  . Headache     migraines  . Anxiety   . Depression     Past Surgical History  Procedure Date  . Fracture surgery 2002    left wrist  . Tubal ligation 11/28/2010    Procedure: ESSURE TUBAL STERILIZATION;  Surgeon: Roseanna Rainbow, MD;  Location: [facility];  Service: Gynecology;  Laterality: N/A;  Attempted essure sterilization. Essure device placed in leftt side only. could not do procedure on right fallopian  tube.   . Tubal ligation 12/05/2010    Procedure: ESSURE TUBAL STERILIZATION;  Surgeon: Roseanna Rainbow, MD;  Location: [facility];  Service: Gynecology;  Laterality: Right;    Family History  Problem Relation Age of Onset  . Cancer Neg Hx   . Diabetes Neg Hx   . Early death Neg Hx   . Heart disease Neg Hx   . Hyperlipidemia Neg Hx   . Hypertension Neg Hx   . Kidney disease Neg Hx   . Stroke Neg Hx     History  Substance Use Topics  . Smoking status: Former Smoker    Quit date: 11/12/2007  . Smokeless tobacco: Never Used  . Alcohol Use: 4.2 oz/week    7 Glasses of wine per week    OB History    Grav Para Term Preterm Abortions TAB SAB Ect Mult Living   1               Review of Systems  Constitutional: Negative for fever and chills.  HENT: Positive for ear pain. Negative for congestion, sore throat, neck pain and neck stiffness.   Eyes: Positive for photophobia.  Respiratory: Negative for chest tightness and shortness of breath.   Cardiovascular: Negative for chest pain.  Gastrointestinal: Negative for nausea and abdominal pain.  Genitourinary: Negative.   Musculoskeletal: Negative for joint swelling and arthralgias.  Skin: Negative.  Negative for rash and wound.  Neurological: Positive for headaches. Negative for dizziness, speech difficulty, weakness, light-headedness and  numbness.  Hematological: Negative.   Psychiatric/Behavioral: Negative.     Allergies  Review of patient's allergies indicates no known allergies.  Home Medications   Current Outpatient Rx  Name Route Sig Dispense Refill  . IBUPROFEN 200 MG PO TABS Oral Take 800 mg by mouth every 6 (six) hours as needed. For pain    . LEVONORGESTREL 20 MCG/24HR IU IUD Intrauterine 1 each by Intrauterine route once.      BP 145/100  Pulse 72  Temp 98.8 F (37.1 C) (Oral)  Resp 16  SpO2 100%  LMP 12/03/2011  Physical Exam  Nursing note and vitals reviewed. Constitutional: She is oriented to person,  place, and time. She appears well-developed and well-nourished.       Uncomfortable appearing  HENT:  Head: Normocephalic and atraumatic.  Mouth/Throat: Oropharynx is clear and moist.  Eyes: EOM are normal. Pupils are equal, round, and reactive to light.  Neck: Normal range of motion. Neck supple.  Cardiovascular: Normal rate and normal heart sounds.   Pulmonary/Chest: Effort normal. She exhibits no tenderness.  Abdominal: Soft. There is no tenderness.  Musculoskeletal: Normal range of motion.  Lymphadenopathy:    She has no cervical adenopathy.  Neurological: She is alert and oriented to person, place, and time. She has normal strength. No sensory deficit. Gait normal. GCS eye subscore is 4. GCS verbal subscore is 5. GCS motor subscore is 6.       Normal heel-shin, normal rapid alternating movements. Cranial nerves III-XII intact.  No pronator drift.  Skin: Skin is warm and dry. No rash noted.  Psychiatric: She has a normal mood and affect. Her speech is normal and behavior is normal. Thought content normal. Cognition and memory are normal.    ED Course  Procedures (including critical care time)  Labs Reviewed - No data to display No results found.   No diagnosis found.   Basic metabolic panel ordered.  IV fluids, Zofran and Decadron per IV ordered.  Patient states driving home therefore deferred on sedating medications.  MDM  Prior to the patient receiving medications or blood draw, she decided that she needed to leave as she promised her children she would take  them somewhere.  She was signed out AMA by RN.        Burgess Amor, PA 01/30/12 1427

## 2012-01-30 NOTE — ED Provider Notes (Signed)
Medical screening examination/treatment/procedure(s) were performed by non-physician practitioner and as supervising physician I was immediately available for consultation/collaboration.  Gerhard Munch, MD 01/30/12 [phone]

## 2012-07-12 ENCOUNTER — Telehealth: Payer: Self-pay | Admitting: *Deleted

## 2012-07-12 NOTE — Telephone Encounter (Signed)
Pt has a Mirena in the past with no to little bleeding. Pt states she is now having bleeding every month that is heavy and bothersome. Pt wants to know what she can do to stop it- she had questions about the ablation procedure, but wants to do it in the office and not hospital. Pt states she has taken hormones to stop her bleeding in the past- wants Dr recommendation.

## 2012-07-13 NOTE — Telephone Encounter (Signed)
Needs appointment to discuss options

## 2012-07-13 NOTE — Telephone Encounter (Signed)
Thelma Barge to call patient to offer her appointment on Friday schedule.

## 2012-08-08 ENCOUNTER — Ambulatory Visit: Payer: BC Managed Care – PPO | Admitting: Obstetrics & Gynecology

## 2012-08-08 ENCOUNTER — Ambulatory Visit: Payer: Self-pay | Admitting: Obstetrics & Gynecology

## 2012-11-28 ENCOUNTER — Ambulatory Visit (INDEPENDENT_AMBULATORY_CARE_PROVIDER_SITE_OTHER): Payer: BC Managed Care – PPO | Admitting: Obstetrics

## 2012-11-28 ENCOUNTER — Encounter: Payer: Self-pay | Admitting: Obstetrics

## 2012-11-28 ENCOUNTER — Encounter: Payer: Self-pay | Admitting: Internal Medicine

## 2012-11-28 ENCOUNTER — Ambulatory Visit (INDEPENDENT_AMBULATORY_CARE_PROVIDER_SITE_OTHER): Payer: BC Managed Care – PPO | Admitting: Internal Medicine

## 2012-11-28 VITALS — BP 118/72 | HR 60 | Temp 99.0°F | Resp 16 | Ht 65.0 in | Wt 201.8 lb

## 2012-11-28 VITALS — BP 118/78 | HR 76 | Temp 99.1°F | Ht 65.0 in | Wt 201.6 lb

## 2012-11-28 DIAGNOSIS — F40298 Other specified phobia: Secondary | ICD-10-CM

## 2012-11-28 DIAGNOSIS — R159 Full incontinence of feces: Secondary | ICD-10-CM

## 2012-11-28 DIAGNOSIS — F40243 Fear of flying: Secondary | ICD-10-CM

## 2012-11-28 DIAGNOSIS — N920 Excessive and frequent menstruation with regular cycle: Secondary | ICD-10-CM

## 2012-11-28 DIAGNOSIS — I10 Essential (primary) hypertension: Secondary | ICD-10-CM

## 2012-11-28 DIAGNOSIS — Z01419 Encounter for gynecological examination (general) (routine) without abnormal findings: Secondary | ICD-10-CM

## 2012-11-28 DIAGNOSIS — E669 Obesity, unspecified: Secondary | ICD-10-CM

## 2012-11-28 DIAGNOSIS — E66811 Obesity, class 1: Secondary | ICD-10-CM | POA: Insufficient documentation

## 2012-11-28 DIAGNOSIS — E6609 Other obesity due to excess calories: Secondary | ICD-10-CM | POA: Insufficient documentation

## 2012-11-28 MED ORDER — LORCASERIN HCL 10 MG PO TABS: 1.0000 | ORAL_TABLET | Freq: Two times a day (BID) | ORAL | Status: DC

## 2012-11-28 MED ORDER — DIAZEPAM 5 MG PO TABS: 5.0000 mg | ORAL_TABLET | Freq: Two times a day (BID) | ORAL | Status: DC | PRN

## 2012-11-28 NOTE — Patient Instructions (Signed)
Obesity Obesity is defined as having too much total body fat and a body mass index (BMI) of 30 or more. BMI is an estimate of body fat and is calculated from your height and weight. Obesity happens when you consume more calories than you can burn by exercising or performing daily physical tasks. Prolonged obesity can cause major illnesses or emergencies, such as:   A stroke.  Heart disease.  Diabetes.  Cancer.  Arthritis.  High blood pressure (hypertension).  High cholesterol.  Sleep apnea.  Erectile dysfunction.  Infertility problems. CAUSES   Regularly eating unhealthy foods.  Physical inactivity.  Certain disorders, such as an underactive thyroid (hypothyroidism), Cushing's syndrome, and polycystic ovarian syndrome.  Certain medicines, such as steroids, some depression medicines, and antipsychotics.  Genetics.  Lack of sleep. DIAGNOSIS  A caregiver can diagnose obesity after calculating your BMI. Obesity will be diagnosed if your BMI is 30 or higher.  There are other methods of measuring obesity levels. Some other methods include measuring your skin fold thickness, your waist circumference, and comparing your hip circumference to your waist circumference. TREATMENT  A healthy treatment program includes some or all of the following:  Long-term dietary changes.  Exercise and physical activity.  Behavioral and lifestyle changes.  Medicine only under the supervision of your caregiver. Medicines may help, but only if they are used with diet and exercise programs. An unhealthy treatment program includes:  Fasting.  Fad diets.  Supplements and drugs. These choices do not succeed in long-term weight control.  HOME CARE INSTRUCTIONS   Exercise and perform physical activity as directed by your caregiver. To increase physical activity, try the following:  Use stairs instead of elevators.  Park farther away from store entrances.  Garden, bike, or walk instead of  watching television or using the computer.  Eat healthy, low-calorie foods and drinks on a regular basis. Eat more fruits and vegetables. Use low-calorie cookbooks or take healthy cooking classes.  Limit fast food, sweets, and processed snack foods.  Eat smaller portions.  Keep a daily journal of everything you eat. There are many free websites to help you with this. It may be helpful to measure your foods so you can determine if you are eating the correct portion sizes.  Avoid drinking alcohol. Drink more water and drinks without calories.  Take vitamins and supplements only as recommended by your caregiver.  Weight-loss support groups, Registered Dieticians, counselors, and stress reduction education can also be very helpful. SEEK IMMEDIATE MEDICAL CARE IF:  You have chest pain or tightness.  You have trouble breathing or feel short of breath.  You have weakness or leg numbness.  You feel confused or have trouble talking.  You have sudden changes in your vision. MAKE SURE YOU:  Understand these instructions.  Will watch your condition.  Will get help right away if you are not doing well or get worse. Document Released: 04/30/2004 Document Revised: 09/22/2011 Document Reviewed: 04/29/2011 [employer] Patient Information 2014 [employer].  

## 2012-11-28 NOTE — Assessment & Plan Note (Signed)
Her BP is well controlled 

## 2012-11-28 NOTE — Assessment & Plan Note (Signed)
In addition to lifestyle modifications she will start belviq

## 2012-11-28 NOTE — Progress Notes (Signed)
.   Subjective:     Stacey Ruiz is a 37 y.o. female here for a routine exam.  Current complaints: patient states that her Mirena is causing very heavy cycles. (Inserted 2013).  Personal health questionnaire reviewed: yes.   Gynecologic History Patient's last menstrual period was 11/09/2012. Contraception: IUD Last Pap: 2012. Results were: normal Last mammogram: N/A  Obstetric History OB History  Gravida Para Term Preterm AB SAB TAB Ectopic Multiple Living  1             # Outcome Date GA Lbr Len/2nd Weight Sex Delivery Anes PTL Lv  1 GRA              Comments: System Generated. Please review and update pregnancy details.       The following portions of the patient's history were reviewed and updated as appropriate: allergies, current medications, past family history, past medical history, past social history, past surgical history and problem list.  Review of Systems Pertinent items are noted in HPI.    Objective:    General appearance: alert and no distress Breasts: normal appearance, no masses or tenderness Abdomen: normal findings: soft, non-tender Pelvic: cervix normal in appearance, external genitalia normal, no adnexal masses or tenderness, no cervical motion tenderness, uterus normal size, shape, and consistency and vagina normal without discharge Extremities: extremities normal, atraumatic, no cyanosis or edema    Assessment:    Healthy female exam.   Menorrhagia.  Wants an in office Endometrial Ablation.  Fecal incontinence.   Plan:      Referred to [facility] for consultation for Ablation, in office procedure.    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    RTC 1 year.

## 2012-11-28 NOTE — Progress Notes (Signed)
  Subjective:    Patient ID: Stacey Ruiz, female    DOB: 01/16/1976, 37 y.o.   MRN: [id number]  HPI  She returns for f/up and requests a medication to help her with anxiety and panic when she flies.  Review of Systems  Constitutional: Negative.   HENT: Negative.   Eyes: Negative.   Respiratory: Negative.  Negative for cough, chest tightness, shortness of breath, wheezing and stridor.   Cardiovascular: Negative.  Negative for chest pain, palpitations and leg swelling.  Gastrointestinal: Negative.   Endocrine: Negative.   Genitourinary: Negative.   Musculoskeletal: Negative.   Skin: Negative.   Allergic/Immunologic: Negative.   Neurological: Negative.  Negative for dizziness.  Hematological: Negative.  Negative for adenopathy. Does not bruise/bleed easily.  Psychiatric/Behavioral: Negative for suicidal ideas, hallucinations, behavioral problems, confusion, sleep disturbance, self-injury, dysphoric mood, decreased concentration and agitation. The patient is nervous/anxious. The patient is not hyperactive.        Objective:   Physical Exam  Vitals reviewed. Constitutional: She is oriented to person, place, and time. She appears well-developed and well-nourished. No distress.  HENT:  Head: Normocephalic and atraumatic.  Mouth/Throat: Oropharynx is clear and moist. No oropharyngeal exudate.  Eyes: Conjunctivae are normal. Right eye exhibits no discharge. Left eye exhibits no discharge. No scleral icterus.  Neck: Normal range of motion. Neck supple. No JVD present. No tracheal deviation present. No thyromegaly present.  Cardiovascular: Normal rate, regular rhythm, normal heart sounds and intact distal pulses.  Exam reveals no gallop and no friction rub.   No murmur heard. Pulmonary/Chest: Effort normal and breath sounds normal. No stridor. No respiratory distress. She has no wheezes. She has no rales. She exhibits no tenderness.  Abdominal: Soft. Bowel sounds are normal. She  exhibits no distension and no mass. There is no tenderness. There is no rebound and no guarding.  Musculoskeletal: Normal range of motion. She exhibits no edema and no tenderness.  Lymphadenopathy:    She has no cervical adenopathy.  Neurological: She is oriented to person, place, and time.  Skin: Skin is warm and dry. No rash noted. She is not diaphoretic. No erythema. No pallor.  Psychiatric: She has a normal mood and affect. Her speech is normal and behavior is normal. Judgment and thought content normal. Her mood appears not anxious. Her affect is not angry, not blunt, not labile and not inappropriate. Cognition and memory are normal. She does not exhibit a depressed mood.     Lab Results  Component Value Date   WBC 4.6 09/07/2011   HGB 13.7 09/07/2011   HCT 41.0 09/07/2011   PLT 268.0 09/07/2011   GLUCOSE 77 09/17/2011   CHOL 129 09/07/2011   TRIG 147.0 09/07/2011   HDL 39.10 09/07/2011   LDLCALC 61 09/07/2011   ALT 20 09/07/2011   AST 22 09/07/2011   NA 140 09/17/2011   K 3.9 09/17/2011   CL 100 09/17/2011   CREATININE 0.9 09/17/2011   BUN 7 09/17/2011   CO2 33* 09/17/2011   TSH 1.27 09/07/2011   INR 0.9 05/20/2011   HGBA1C 5.9 09/17/2011       Assessment & Plan:

## 2012-11-28 NOTE — Assessment & Plan Note (Signed)
She will take valium as needed 

## 2012-11-29 LAB — CBC WITH DIFFERENTIAL/PLATELET
Basophils Relative: 1 % (ref 0–1)
Eosinophils Absolute: 0.2 10*3/uL (ref 0.0–0.7)
Eosinophils Relative: 3 % (ref 0–5)
Lymphs Abs: 2.1 10*3/uL (ref 0.7–4.0)
MCH: 25.3 pg — ABNORMAL LOW (ref 26.0–34.0)
MCHC: 32.2 g/dL (ref 30.0–36.0)
MCV: 78.4 fL (ref 78.0–100.0)
Monocytes Relative: 10 % (ref 3–12)
Platelets: 342 10*3/uL (ref 150–400)
RBC: 4.31 MIL/uL (ref 3.87–5.11)

## 2012-11-29 LAB — TSH: TSH: 1.533 u[IU]/mL (ref 0.350–4.500)

## 2012-11-29 LAB — PAP IG W/ RFLX HPV ASCU

## 2012-11-29 LAB — COMPREHENSIVE METABOLIC PANEL
Alkaline Phosphatase: 67 U/L (ref 39–117)
CO2: 28 mEq/L (ref 19–32)
Creat: 0.78 mg/dL (ref 0.50–1.10)
Glucose, Bld: 77 mg/dL (ref 70–99)
Total Bilirubin: 0.5 mg/dL (ref 0.3–1.2)

## 2012-11-29 LAB — WET PREP BY MOLECULAR PROBE
Candida species: NEGATIVE
Trichomonas vaginosis: NEGATIVE

## 2012-12-23 ENCOUNTER — Ambulatory Visit (INDEPENDENT_AMBULATORY_CARE_PROVIDER_SITE_OTHER): Payer: BC Managed Care – PPO | Admitting: General Surgery

## 2013-01-03 ENCOUNTER — Other Ambulatory Visit (HOSPITAL_COMMUNITY): Payer: Self-pay | Admitting: Obstetrics & Gynecology

## 2013-01-03 DIAGNOSIS — Z975 Presence of (intrauterine) contraceptive device: Secondary | ICD-10-CM

## 2013-01-04 ENCOUNTER — Ambulatory Visit (HOSPITAL_COMMUNITY)
Admission: RE | Admit: 2013-01-04 | Discharge: 2013-01-04 | Disposition: A | Discharge status: Home / Self Care | Payer: BC Managed Care – PPO | Source: Ambulatory Visit | Attending: Obstetrics & Gynecology | Admitting: Obstetrics & Gynecology

## 2013-01-04 DIAGNOSIS — Z975 Presence of (intrauterine) contraceptive device: Secondary | ICD-10-CM

## 2013-04-21 ENCOUNTER — Encounter: Payer: Self-pay | Admitting: Internal Medicine

## 2013-04-21 ENCOUNTER — Ambulatory Visit (INDEPENDENT_AMBULATORY_CARE_PROVIDER_SITE_OTHER): Payer: BC Managed Care – PPO | Admitting: Internal Medicine

## 2013-04-21 VITALS — BP 132/100 | HR 76 | Temp 99.0°F | Ht 65.0 in | Wt 200.2 lb

## 2013-04-21 DIAGNOSIS — I10 Essential (primary) hypertension: Secondary | ICD-10-CM

## 2013-04-21 DIAGNOSIS — D62 Acute posthemorrhagic anemia: Secondary | ICD-10-CM

## 2013-04-21 DIAGNOSIS — R7309 Other abnormal glucose: Secondary | ICD-10-CM

## 2013-04-21 MED ORDER — IRBESARTAN 300 MG PO TABS: 300.0000 mg | ORAL_TABLET | Freq: Every day | ORAL | Status: DC

## 2013-04-21 NOTE — Assessment & Plan Note (Signed)
Not able to have further children, mod to severe elev BP persistent, for higher dose Avapro 300 qd to start, may help with renoprotection, edema and slow down chance of worsening glucose, avoid diuretic to start due to chance of making sugar event worse;  Consider add low dose next visit if needed howevere, for reg exercise, wt loss, no added salt, and etoh in moderation; f/u with PCP 4 wks, consider check K/bun/cr

## 2013-04-21 NOTE — Patient Instructions (Signed)
Please take all new medication as prescribed Please continue all other medications as before, and refills have been done if requested. Please have the pharmacy call with any other refills you may need.  Please remember to sign up for My Chart if you have not done so, as this will be important to you in the future with finding out test results, communicating by private email, and scheduling acute appointments online when needed.  Please return in 1 months to Dr Ronnald Ramp, or sooner if needed

## 2013-04-21 NOTE — Progress Notes (Signed)
Pre-visit discussion using our clinic review tool. No additional management support is needed unless otherwise documented below in the visit note.  

## 2013-04-21 NOTE — Assessment & Plan Note (Signed)
Post ablation;  Lab Results  Component Value Date   WBC 5.8 11/28/2012   HGB 10.9* 11/28/2012   HCT 33.8* 11/28/2012   MCV 78.4 11/28/2012   PLT 342 11/28/2012   Consider f/u [facility] with next labs

## 2013-04-21 NOTE — Progress Notes (Signed)
Subjective:    Patient ID: Stacey Ruiz, female    DOB: 24-May-1975, 38 y.o.   MRN: [id number]  HPI   Here to f/u; overall doing ok,  Pt denies chest pain, increased sob or doe, wheezing, orthopnea, PND, increased LE swelling, palpitations, or syncope, but occas has room spinning/off balance for a few seconds occas randomly over the past month, not every day.  Pt denies polydipsia, polyuria, or low sugar symptoms such as weakness or confusion improved with po intake.  Pt denies new neurological symptoms such as or facial or extremity weakness or numbness, but has had several HAs recently.   Pt states overall good compliance with meds, has been trying to follow lower cholesterol diet,   S/p uterine ablation dec 5 for heavy bleeding after lost IUD.  BP quite elevated around the time of the procedure., though possibly related to stress and pain.  Bp since then checked at home still at times 170 sbp, verified by a pharmacy BP.  Yesterday 170/11 at end of work yesterday.  Overall lost 5 lbs since last yr. Snores at night, no daytime somnolence. Past Medical History  Diagnosis Date  . Headache(784.0)     migraines  . Anxiety   . Depression    Past Surgical History  Procedure Laterality Date  . Fracture surgery  2002    left wrist  . Tubal ligation  11/28/2010    Procedure: ESSURE TUBAL STERILIZATION;  Surgeon: Agnes Lawrence, MD;  Location: [facility];  Service: Gynecology;  Laterality: N/A;  Attempted essure sterilization. Essure device placed in leftt side only. could not do procedure on right fallopian tube.   . Tubal ligation  12/05/2010    Procedure: ESSURE TUBAL STERILIZATION;  Surgeon: Agnes Lawrence, MD;  Location: [facility];  Service: Gynecology;  Laterality: Right;    reports that she has been smoking Cigarettes.  She has been smoking about 0.00 packs per day. She has never used smokeless tobacco. She reports that she drinks about 4.2 ounces of alcohol per week. She reports that she  uses illicit drugs (Marijuana). family history includes Cancer in her mother. There is no history of Diabetes, Early death, Heart disease, Hyperlipidemia, Hypertension, Kidney disease, or Stroke. No Known Allergies Current Outpatient Prescriptions on File Prior to Visit  Medication Sig Dispense Refill  . diazepam (VALIUM) 5 MG tablet Take 1 tablet (5 mg total) by mouth every 12 (twelve) hours as needed for anxiety.  30 tablet  1  . ibuprofen (ADVIL,MOTRIN) 200 MG tablet Take 800 mg by mouth every 6 (six) hours as needed. For pain       No current facility-administered medications on file prior to visit.   Review of Systems  Constitutional: Negative for unexpected weight change, or unusual diaphoresis  HENT: Negative for tinnitus.   Eyes: Negative for photophobia and visual disturbance.  Respiratory: Negative for choking and stridor.   Gastrointestinal: Negative for vomiting and blood in stool.  Genitourinary: Negative for hematuria and decreased urine volume.  Musculoskeletal: Negative for acute joint swelling Skin: Negative for color change and wound.  Neurological: Negative for tremors and numbness other than noted  Psychiatric/Behavioral: Negative for decreased concentration or  hyperactivity.       Objective:   Physical Exam BP 132/100  Pulse 76  Temp(Src) 99 F (37.2 C) (Oral)  Ht 5\' 5"  (1.651 m)  Wt 200 lb 4 oz (90.833 kg)  BMI 33.32 kg/m2  SpO2 98% VS noted,  Constitutional: Pt  appears well-developed and well-nourished.  HENT: Head: NCAT.  Right Ear: External ear normal.  Left Ear: External ear normal.  Eyes: Conjunctivae and EOM are normal. Pupils are equal, round, and reactive to light.  Neck: Normal range of motion. Neck supple.  Cardiovascular: Normal rate and regular rhythm.   Pulmonary/Chest: Effort normal and breath sounds normal.  Neurological: Pt is alert. Not confused  Skin: Skin is warm. No erythema. trace ankle edema Psychiatric: Pt behavior is normal.  Thought content normal.      Assessment & Plan:

## 2013-04-21 NOTE — Assessment & Plan Note (Signed)
Asympt, for wt loss, consider a1c with next visit

## 2013-04-22 ENCOUNTER — Telehealth: Payer: Self-pay | Admitting: Internal Medicine

## 2013-04-22 NOTE — Telephone Encounter (Signed)
Relevant patient education assigned to patient using Emmi. ° °

## 2013-05-26 ENCOUNTER — Other Ambulatory Visit (INDEPENDENT_AMBULATORY_CARE_PROVIDER_SITE_OTHER): Payer: BC Managed Care – PPO

## 2013-05-26 ENCOUNTER — Encounter: Payer: Self-pay | Admitting: Internal Medicine

## 2013-05-26 ENCOUNTER — Ambulatory Visit (INDEPENDENT_AMBULATORY_CARE_PROVIDER_SITE_OTHER): Payer: BC Managed Care – PPO | Admitting: Internal Medicine

## 2013-05-26 VITALS — BP 132/90 | HR 88 | Temp 98.6°F | Ht 65.0 in | Wt 207.5 lb

## 2013-05-26 DIAGNOSIS — E669 Obesity, unspecified: Secondary | ICD-10-CM

## 2013-05-26 DIAGNOSIS — I10 Essential (primary) hypertension: Secondary | ICD-10-CM

## 2013-05-26 DIAGNOSIS — R7309 Other abnormal glucose: Secondary | ICD-10-CM

## 2013-05-26 LAB — BASIC METABOLIC PANEL
BUN: 8 mg/dL (ref 6–23)
CHLORIDE: 105 meq/L (ref 96–112)
CO2: 27 meq/L (ref 19–32)
CREATININE: 0.7 mg/dL (ref 0.4–1.2)
Calcium: 9.1 mg/dL (ref 8.4–10.5)
GFR: 121.79 mL/min (ref >60.00)
Glucose, Bld: 117 mg/dL — ABNORMAL HIGH (ref 70–99)
Potassium: 4.2 mEq/L (ref 3.5–5.1)
SODIUM: 137 meq/L (ref 135–145)

## 2013-05-26 MED ORDER — AMLODIPINE BESYLATE 5 MG PO TABS: 5.0000 mg | ORAL_TABLET | Freq: Every day | ORAL | Status: DC

## 2013-05-26 NOTE — Patient Instructions (Signed)
Please take all new medication as prescribed Please continue all other medications as before, and refills have been done if requested. Please have the pharmacy call with any other refills you may need.  Please go to the LAB in the Basement (turn left off the elevator) for the tests to be done today You will be contacted by phone if any changes need to be made immediately.  Otherwise, you will receive a letter about your results with an explanation, but please check with MyChart first.  Please remember to sign up for My Chart if you have not done so, as this will be important to you in the future with finding out test results, communicating by private email, and scheduling acute appointments online when needed.   

## 2013-05-26 NOTE — Progress Notes (Signed)
Pre-visit discussion using our clinic review tool. No additional management support is needed unless otherwise documented below in the visit note.  

## 2013-05-26 NOTE — Assessment & Plan Note (Signed)
stable overall by history and exam, recent data reviewed with pt, and pt to continue medical treatment as before,  to f/u any worsening symptoms or concerns Lab Results  Component Value Date   WBC 5.8 11/28/2012   HGB 10.9* 11/28/2012   HCT 33.8* 11/28/2012   PLT 342 11/28/2012   GLUCOSE 77 11/28/2012   CHOL 129 09/07/2011   TRIG 147.0 09/07/2011   HDL 39.10 09/07/2011   LDLCALC 61 09/07/2011   ALT 12 11/28/2012   AST 14 11/28/2012   NA 138 11/28/2012   K 3.9 11/28/2012   CL 105 11/28/2012   CREATININE 0.78 11/28/2012   BUN 10 11/28/2012   CO2 28 11/28/2012   TSH 1.533 11/28/2012   INR 0.9 05/20/2011   HGBA1C 5.9 09/17/2011   Also for glc with labs todya

## 2013-05-26 NOTE — Assessment & Plan Note (Signed)
Encouraged more exercise, less calories and less focus on pushing oral fluids as a mechanism for losing wt

## 2013-05-26 NOTE — Assessment & Plan Note (Signed)
Somewhat improved DBP today, but BP still elevated overall, drinks plenty of fluids to try to lose wt so HCT likely to be less effective, ok to add amlod 5 qd, also for bmet today, and f/u BP at home and next visit with PCP BP Readings from Last 3 Encounters:  05/26/13 132/90  04/21/13 132/100  11/28/12 118/78

## 2013-05-26 NOTE — Progress Notes (Signed)
Subjective:    Patient ID: Stacey Ruiz, female    DOB: 1975/07/16, 38 y.o.   MRN: [id number]  HPI Here to f/u as Dr Ronnald Ramp not in this wk;  Pt denies chest pain, increased sob or doe, wheezing, orthopnea, PND, increased LE swelling, palpitations, dizziness or syncope.  Pt denies new neurological symptoms such as new headache, or facial or extremity weakness or numbness   Pt denies polydipsia, polyuria, Overall good compliance with treatment, and good medicine tolerability. But BP still at today level or higher, once at 153/95 at home recently Past Medical History  Diagnosis Date  . Headache(784.0)     migraines  . Anxiety   . Depression    Past Surgical History  Procedure Laterality Date  . Fracture surgery  2002    left wrist  . Tubal ligation  11/28/2010    Procedure: ESSURE TUBAL STERILIZATION;  Surgeon: Agnes Lawrence, MD;  Location: [facility];  Service: Gynecology;  Laterality: N/A;  Attempted essure sterilization. Essure device placed in leftt side only. could not do procedure on right fallopian tube.   . Tubal ligation  12/05/2010    Procedure: ESSURE TUBAL STERILIZATION;  Surgeon: Agnes Lawrence, MD;  Location: [facility];  Service: Gynecology;  Laterality: Right;    reports that she has been smoking Cigarettes.  She has been smoking about 0.00 packs per day. She has never used smokeless tobacco. She reports that she drinks about 4.2 ounces of alcohol per week. She reports that she uses illicit drugs (Marijuana). family history includes Cancer in her mother. There is no history of Diabetes, Early death, Heart disease, Hyperlipidemia, Hypertension, Kidney disease, or Stroke. No Known Allergies Current Outpatient Prescriptions on File Prior to Visit  Medication Sig Dispense Refill  . diazepam (VALIUM) 5 MG tablet Take 1 tablet (5 mg total) by mouth every 12 (twelve) hours as needed for anxiety.  30 tablet  1  . ibuprofen (ADVIL,MOTRIN) 200 MG tablet Take 800 mg by mouth  every 6 (six) hours as needed. For pain      . irbesartan (AVAPRO) 300 MG tablet Take 1 tablet (300 mg total) by mouth daily.  90 tablet  3   No current facility-administered medications on file prior to visit.   Review of Systems  Constitutional: Negative for unexpected weight change, or unusual diaphoresis  HENT: Negative for tinnitus.   Eyes: Negative for photophobia and visual disturbance.  Respiratory: Negative for choking and stridor.   Gastrointestinal: Negative for vomiting and blood in stool.  Genitourinary: Negative for hematuria and decreased urine volume.  Musculoskeletal: Negative for acute joint swelling Skin: Negative for color change and wound.  Neurological: Negative for tremors and numbness other than noted  Psychiatric/Behavioral: Negative for decreased concentration or  hyperactivity.       Objective:   Physical Exam BP 132/90  Pulse 88  Temp(Src) 98.6 F (37 C) (Oral)  Ht 5\' 5"  (1.651 m)  Wt 207 lb 8 oz (94.121 kg)  BMI 34.53 kg/m2  SpO2 98% VS noted,  Constitutional: Pt appears well-developed and well-nourished.  HENT: Head: NCAT.  Right Ear: External ear normal.  Left Ear: External ear normal.  Eyes: Conjunctivae and EOM are normal. Pupils are equal, round, and reactive to light.  Neck: Normal range of motion. Neck supple.  Cardiovascular: Normal rate and regular rhythm.   Pulmonary/Chest: Effort normal and breath sounds normal.  Neurological: Pt is alert. Not confused  Skin: Skin is warm. No erythema.  Psychiatric: Pt behavior is normal. Thought content normal.     Assessment & Plan:

## 2013-09-07 ENCOUNTER — Encounter (HOSPITAL_COMMUNITY): Payer: Self-pay | Admitting: Emergency Medicine

## 2013-09-07 ENCOUNTER — Emergency Department (HOSPITAL_COMMUNITY): Payer: BC Managed Care – PPO

## 2013-09-07 DIAGNOSIS — Z79899 Other long term (current) drug therapy: Secondary | ICD-10-CM | POA: Insufficient documentation

## 2013-09-07 DIAGNOSIS — F411 Generalized anxiety disorder: Secondary | ICD-10-CM | POA: Insufficient documentation

## 2013-09-07 DIAGNOSIS — F3289 Other specified depressive episodes: Secondary | ICD-10-CM | POA: Insufficient documentation

## 2013-09-07 DIAGNOSIS — G43909 Migraine, unspecified, not intractable, without status migrainosus: Secondary | ICD-10-CM | POA: Insufficient documentation

## 2013-09-07 DIAGNOSIS — J159 Unspecified bacterial pneumonia: Secondary | ICD-10-CM | POA: Insufficient documentation

## 2013-09-07 DIAGNOSIS — F329 Major depressive disorder, single episode, unspecified: Secondary | ICD-10-CM | POA: Insufficient documentation

## 2013-09-07 DIAGNOSIS — F17200 Nicotine dependence, unspecified, uncomplicated: Secondary | ICD-10-CM | POA: Insufficient documentation

## 2013-09-07 DIAGNOSIS — Z792 Long term (current) use of antibiotics: Secondary | ICD-10-CM | POA: Insufficient documentation

## 2013-09-07 LAB — COMPREHENSIVE METABOLIC PANEL
ALT: 14 U/L (ref 0–35)
AST: 19 U/L (ref 0–37)
Albumin: 4.1 g/dL (ref 3.5–5.2)
Alkaline Phosphatase: 72 U/L (ref 39–117)
BUN: 10 mg/dL (ref 6–23)
CALCIUM: 9.1 mg/dL (ref 8.4–10.5)
CO2: 24 meq/L (ref 19–32)
Chloride: 98 mEq/L (ref 96–112)
Creatinine, Ser: 0.82 mg/dL (ref 0.50–1.10)
GFR calc Af Amer: >90 mL/min (ref >90)
GFR, EST NON AFRICAN AMERICAN: 89 mL/min — AB (ref >90)
Glucose, Bld: 121 mg/dL — ABNORMAL HIGH (ref 70–99)
Potassium: 3.6 mEq/L — ABNORMAL LOW (ref 3.7–5.3)
SODIUM: 135 meq/L — AB (ref 137–147)
Total Bilirubin: 0.7 mg/dL (ref 0.3–1.2)
Total Protein: 8 g/dL (ref 6.0–8.3)

## 2013-09-07 LAB — CBC WITH DIFFERENTIAL/PLATELET
Basophils Absolute: 0 10*3/uL (ref 0.0–0.1)
Basophils Relative: 0 % (ref 0–1)
Eosinophils Absolute: 0 10*3/uL (ref 0.0–0.7)
Eosinophils Relative: 0 % (ref 0–5)
HCT: 37.3 % (ref 36.0–46.0)
Hemoglobin: 12.5 g/dL (ref 12.0–15.0)
LYMPHS PCT: 5 % — AB (ref 12–46)
Lymphs Abs: 0.9 10*3/uL (ref 0.7–4.0)
MCH: 27.7 pg (ref 26.0–34.0)
MCHC: 33.5 g/dL (ref 30.0–36.0)
MCV: 82.5 fL (ref 78.0–100.0)
Monocytes Absolute: 0.8 10*3/uL (ref 0.1–1.0)
Monocytes Relative: 5 % (ref 3–12)
Neutro Abs: 15.6 10*3/uL — ABNORMAL HIGH (ref 1.7–7.7)
Neutrophils Relative %: 90 % — ABNORMAL HIGH (ref 43–77)
PLATELETS: 253 10*3/uL (ref 150–400)
RBC: 4.52 MIL/uL (ref 3.87–5.11)
RDW: 13.5 % (ref 11.5–15.5)
WBC: 17.4 10*3/uL — AB (ref 4.0–10.5)

## 2013-09-07 NOTE — ED Notes (Signed)
Pt. reports fever , generalized body aches and productive cough onset Monday this week , received Tylenol 1 gram from [facility] prior to arrival .

## 2013-09-08 ENCOUNTER — Emergency Department (HOSPITAL_COMMUNITY)
Admission: EM | Admit: 2013-09-08 | Discharge: 2013-09-08 | Disposition: A | Discharge status: Home / Self Care | Payer: BC Managed Care – PPO | Attending: Emergency Medicine | Admitting: Emergency Medicine

## 2013-09-08 DIAGNOSIS — J189 Pneumonia, unspecified organism: Secondary | ICD-10-CM

## 2013-09-08 MED ORDER — AZITHROMYCIN 250 MG PO TABS
500.0000 mg | ORAL_TABLET | Freq: Once | ORAL | Status: AC
  Administered 2013-09-08: 500 mg via ORAL
  Filled 2013-09-08: qty 2

## 2013-09-08 MED ORDER — AZITHROMYCIN 250 MG PO TABS: 250.0000 mg | ORAL_TABLET | Freq: Every day | ORAL | Status: DC

## 2013-09-08 NOTE — Discharge Instructions (Signed)

## 2013-09-08 NOTE — ED Provider Notes (Signed)
CSN: [id number]     Arrival date & time 09/07/13  2055 History   First MD Initiated Contact with Patient 09/08/13 0049     Chief Complaint  Patient presents with  . Fever  . Cough  . Generalized Body Aches     (Consider location/radiation/quality/duration/timing/severity/associated sxs/prior Treatment) HPI 38 yo female presents to the ER from home with complaint of 4 days of cough, body aches, fatigue starting on Monday.  Developed fever yesterday.  Denies any medical problems aside from HTN.  Very seldom smokes.  No known sick contacts.  No sob.  Mild right back pain. Past Medical History  Diagnosis Date  . Headache(784.0)     migraines  . Anxiety   . Depression    Past Surgical History  Procedure Laterality Date  . Fracture surgery  2002    left wrist  . Tubal ligation  11/28/2010    Procedure: ESSURE TUBAL STERILIZATION;  Surgeon: Agnes Lawrence, MD;  Location: [facility];  Service: Gynecology;  Laterality: N/A;  Attempted essure sterilization. Essure device placed in leftt side only. could not do procedure on right fallopian tube.   . Tubal ligation  12/05/2010    Procedure: ESSURE TUBAL STERILIZATION;  Surgeon: Agnes Lawrence, MD;  Location: [facility];  Service: Gynecology;  Laterality: Right;   Family History  Problem Relation Age of Onset  . Diabetes Neg Hx   . Early death Neg Hx   . Heart disease Neg Hx   . Hyperlipidemia Neg Hx   . Hypertension Neg Hx   . Kidney disease Neg Hx   . Stroke Neg Hx   . Cancer Mother    History  Substance Use Topics  . Smoking status: Current Some Day Smoker    Types: Cigarettes    Last Attempt to Quit: 11/12/2007  . Smokeless tobacco: Never Used  . Alcohol Use: 4.2 oz/week    7 Glasses of wine per week   OB History   Grav Para Term Preterm Abortions TAB SAB Ect Mult Living   1              Review of Systems  All other systems reviewed and are negative.     Allergies  Review of patient's allergies indicates no known  allergies.  Home Medications   Prior to Admission medications   Medication Sig Start Date End Date Taking? Authorizing Provider  amLODipine (NORVASC) 5 MG tablet Take 1 tablet (5 mg total) by mouth daily. 05/26/13 05/26/14 Yes Biagio Borg, MD  diazepam (VALIUM) 5 MG tablet Take 1 tablet (5 mg total) by mouth every 12 (twelve) hours as needed for anxiety. 11/28/12  Yes Janith Lima, MD  irbesartan (AVAPRO) 300 MG tablet Take 1 tablet (300 mg total) by mouth daily. 04/21/13  Yes Biagio Borg, MD  azithromycin (ZITHROMAX) 250 MG tablet Take 1 tablet (250 mg total) by mouth daily. 09/08/13   Kalman Drape, MD   BP 109/70  Pulse 105  Temp(Src) 98.4 F (36.9 C) (Oral)  Resp 20  SpO2 100%  LMP 09/03/2013 Physical Exam  Nursing note and vitals reviewed. Constitutional: She is oriented to person, place, and time. She appears well-developed and well-nourished. She appears distressed (uncomfortable appearing).  HENT:  Head: Normocephalic and atraumatic.  Nose: Nose normal.  Mouth/Throat: Oropharynx is clear and moist.  Eyes: Conjunctivae and EOM are normal. Pupils are equal, round, and reactive to light.  Neck: Normal range of motion. Neck supple. No JVD  present. No tracheal deviation present. No thyromegaly present.  Cardiovascular: Normal rate, regular rhythm, normal heart sounds and intact distal pulses.  Exam reveals no gallop and no friction rub.   No murmur heard. Pulmonary/Chest: Effort normal and breath sounds normal. No stridor. No respiratory distress. She has no wheezes. She has no rales. She exhibits no tenderness.  Mild right sided rhonchi  Abdominal: Soft. Bowel sounds are normal. She exhibits no distension and no mass. There is no tenderness. There is no rebound and no guarding.  Musculoskeletal: Normal range of motion. She exhibits no edema and no tenderness.  Lymphadenopathy:    She has no cervical adenopathy.  Neurological: She is alert and oriented to person, place, and time.  She exhibits normal muscle tone. Coordination normal.  Skin: Skin is warm and dry. No rash noted. No erythema. No pallor.  Psychiatric: She has a normal mood and affect. Her behavior is normal. Judgment and thought content normal.    ED Course  Procedures (including critical care time) Labs Review Labs Reviewed  CBC WITH DIFFERENTIAL - Abnormal; Notable for the following:    WBC 17.4 (*)    Neutrophils Relative % 90 (*)    Neutro Abs 15.6 (*)    Lymphocytes Relative 5 (*)    All other components within normal limits  COMPREHENSIVE METABOLIC PANEL - Abnormal; Notable for the following:    Sodium 135 (*)    Potassium 3.6 (*)    Glucose, Bld 121 (*)    GFR calc non Af Amer 89 (*)    All other components within normal limits    Imaging Review Dg Chest 2 View  09/07/2013   CLINICAL DATA:  Fever, cough, body aches.  EXAM: CHEST  2 VIEW  COMPARISON:  09/17/2011  FINDINGS: Masslike opacity projects lateral to the right hilum, which may reflect focal pneumonia given the history. Neoplastic disease is not excluded.  Lungs are otherwise clear.  No pleural effusion.  No pneumothorax.  Cardiac silhouette is normal in size. Normal mediastinal and left hilar contours.  Bony thorax is unremarkable.  IMPRESSION: Masslike opacity lateral to the right hilum which may reside in the right middle lobe, right upper lobe or extend between the to. This is likely focal pneumonia given history. Recommend followup chest radiograph in 2 weeks falling treatment to document improvement/resolution.   Electronically Signed   By: Lajean Manes M.D.   On: 09/07/2013 22:26     EKG Interpretation None      MDM   Final diagnoses:  CAP (community acquired pneumonia)    38 yo female with CAP.  CXR slightly atypical.  Will have her f/u with her pcm for repeat xray in 1 week.  No hypoxia or other signs on exam requiring admission.  Will d/c home with zithromax.  Return precautions given.    Kalman Drape,  MD 09/08/13 2114

## 2013-09-15 ENCOUNTER — Ambulatory Visit (INDEPENDENT_AMBULATORY_CARE_PROVIDER_SITE_OTHER): Payer: BC Managed Care – PPO | Admitting: Internal Medicine

## 2013-09-15 ENCOUNTER — Ambulatory Visit (INDEPENDENT_AMBULATORY_CARE_PROVIDER_SITE_OTHER)
Admission: RE | Admit: 2013-09-15 | Discharge: 2013-09-15 | Disposition: A | Discharge status: Home / Self Care | Payer: BC Managed Care – PPO | Source: Ambulatory Visit | Attending: Internal Medicine | Admitting: Internal Medicine

## 2013-09-15 ENCOUNTER — Encounter: Payer: Self-pay | Admitting: Internal Medicine

## 2013-09-15 VITALS — BP 120/60 | HR 67 | Temp 98.4°F | Resp 16 | Ht 65.0 in | Wt 206.8 lb

## 2013-09-15 DIAGNOSIS — F40243 Fear of flying: Secondary | ICD-10-CM

## 2013-09-15 DIAGNOSIS — I10 Essential (primary) hypertension: Secondary | ICD-10-CM

## 2013-09-15 DIAGNOSIS — Z23 Encounter for immunization: Secondary | ICD-10-CM

## 2013-09-15 DIAGNOSIS — J189 Pneumonia, unspecified organism: Secondary | ICD-10-CM

## 2013-09-15 DIAGNOSIS — F40298 Other specified phobia: Secondary | ICD-10-CM

## 2013-09-15 MED ORDER — DIAZEPAM 5 MG PO TABS: 5.0000 mg | ORAL_TABLET | Freq: Two times a day (BID) | ORAL | Status: DC | PRN

## 2013-09-15 NOTE — Patient Instructions (Signed)

## 2013-09-15 NOTE — Progress Notes (Signed)
Pre visit review using our clinic review tool, if applicable. No additional management support is needed unless otherwise documented below in the visit note. 

## 2013-09-17 NOTE — Progress Notes (Signed)
   Subjective:    Patient ID: Stacey Ruiz, female    DOB: [birth year], 38 y.o.   MRN: [id number]  Cough This is a new problem. The current episode started 1 to 4 weeks ago. The problem has been rapidly improving. The problem occurs every few hours. The cough is non-productive. Pertinent negatives include no chest pain, chills, ear congestion, ear pain, fever, headaches, heartburn, hemoptysis, myalgias, nasal congestion, postnasal drip, rash, rhinorrhea, sore throat, shortness of breath, sweats, weight loss or wheezing. Risk factors for lung disease include smoking/tobacco exposure. Treatments tried: zithromax. The treatment provided significant relief. Her past medical history is significant for pneumonia. There is no history of asthma, bronchiectasis, bronchitis, COPD, emphysema or environmental allergies.      Review of Systems  Constitutional: Negative.  Negative for fever, chills, weight loss, diaphoresis, activity change, appetite change, fatigue and unexpected weight change.  HENT: Negative.  Negative for ear pain, postnasal drip, rhinorrhea, sore throat, trouble swallowing and voice change.   Eyes: Negative.   Respiratory: Positive for cough. Negative for apnea, hemoptysis, choking, chest tightness, shortness of breath, wheezing and stridor.   Cardiovascular: Negative.  Negative for chest pain, palpitations and leg swelling.  Gastrointestinal: Negative.  Negative for heartburn, nausea, vomiting, abdominal pain, diarrhea, constipation and blood in stool.  Endocrine: Negative.   Genitourinary: Negative.   Musculoskeletal: Negative.  Negative for arthralgias, joint swelling and myalgias.  Skin: Negative.  Negative for rash.  Allergic/Immunologic: Negative.  Negative for environmental allergies.  Neurological: Negative.  Negative for headaches.  Hematological: Negative.  Negative for adenopathy. Does not bruise/bleed easily.  Psychiatric/Behavioral: Negative.        Objective:   Physical Exam  Vitals reviewed. Constitutional: She is oriented to person, place, and time. She appears well-developed and well-nourished.  Non-toxic appearance. She does not have a sickly appearance. She does not appear ill. No distress.  HENT:  Head: Normocephalic and atraumatic.  Mouth/Throat: Oropharynx is clear and moist. No oropharyngeal exudate.  Eyes: Conjunctivae are normal. Right eye exhibits no discharge. Left eye exhibits no discharge. No scleral icterus.  Neck: Normal range of motion. Neck supple. No JVD present. No tracheal deviation present. No thyromegaly present.  Cardiovascular: Normal rate, regular rhythm, normal heart sounds and intact distal pulses.  Exam reveals no gallop and no friction rub.   No murmur heard. Pulmonary/Chest: Effort normal and breath sounds normal. No stridor. No respiratory distress. She has no wheezes. She has no rales. She exhibits no tenderness.  Abdominal: Soft. Bowel sounds are normal. She exhibits no distension and no mass. There is no tenderness. There is no rebound and no guarding.  Musculoskeletal: Normal range of motion. She exhibits no edema and no tenderness.  Lymphadenopathy:    She has no cervical adenopathy.  Neurological: She is oriented to person, place, and time.  Skin: Skin is warm and dry. No rash noted. She is not diaphoretic. No erythema. No pallor.          Assessment & Plan:

## 2013-09-17 NOTE — Assessment & Plan Note (Signed)
Her BP is well controlled 

## 2013-09-17 NOTE — Assessment & Plan Note (Signed)
She appears to be much better Her CXR is better but not completely resolved She will RTC in 3 weeks for a recheck on the CXR and will let me know if she develops any new s/s

## 2013-09-22 ENCOUNTER — Telehealth: Payer: Self-pay

## 2013-09-22 NOTE — Telephone Encounter (Signed)
The patient has had an upset stomach since completing her antibiotic.  Please Advise.  Call back number [phone]

## 2013-09-23 NOTE — Telephone Encounter (Signed)
If her symptoms don't resolve she will need to be seen

## 2013-09-26 NOTE — Telephone Encounter (Signed)
Called the patient unable to leave message as voice mail not set up and no answer.

## 2013-09-27 NOTE — Telephone Encounter (Signed)
Patient informed of MD instructions and has scheduled appointment for Friday June 26 with her PCP.

## 2013-09-29 ENCOUNTER — Encounter: Payer: Self-pay | Admitting: Internal Medicine

## 2013-09-29 ENCOUNTER — Other Ambulatory Visit (INDEPENDENT_AMBULATORY_CARE_PROVIDER_SITE_OTHER): Payer: BC Managed Care – PPO

## 2013-09-29 ENCOUNTER — Ambulatory Visit (INDEPENDENT_AMBULATORY_CARE_PROVIDER_SITE_OTHER): Payer: BC Managed Care – PPO | Admitting: Internal Medicine

## 2013-09-29 VITALS — BP 132/88 | HR 72 | Temp 98.4°F | Resp 16 | Ht 65.0 in | Wt 207.1 lb

## 2013-09-29 DIAGNOSIS — D72829 Elevated white blood cell count, unspecified: Secondary | ICD-10-CM

## 2013-09-29 DIAGNOSIS — R197 Diarrhea, unspecified: Secondary | ICD-10-CM

## 2013-09-29 LAB — CBC WITH DIFFERENTIAL/PLATELET
BASOS ABS: 0.1 10*3/uL (ref 0.0–0.1)
BASOS PCT: 1.1 % (ref 0.0–3.0)
EOS ABS: 0.1 10*3/uL (ref 0.0–0.7)
Eosinophils Relative: 2.3 % (ref 0.0–5.0)
HCT: 37.3 % (ref 36.0–46.0)
HEMOGLOBIN: 12.4 g/dL (ref 12.0–15.0)
LYMPHS ABS: 2.4 10*3/uL (ref 0.7–4.0)
Lymphocytes Relative: 42.9 % (ref 12.0–46.0)
MCHC: 33.3 g/dL (ref 30.0–36.0)
MCV: 83.4 fl (ref 78.0–100.0)
Monocytes Absolute: 0.7 10*3/uL (ref 0.1–1.0)
Monocytes Relative: 11.9 % (ref 3.0–12.0)
NEUTROS ABS: 2.3 10*3/uL (ref 1.4–7.7)
Neutrophils Relative %: 41.8 % — ABNORMAL LOW (ref 43.0–77.0)
Platelets: 314 10*3/uL (ref 150.0–400.0)
RBC: 4.47 Mil/uL (ref 3.87–5.11)
RDW: 14.1 % (ref 11.5–15.5)
WBC: 5.5 10*3/uL (ref 4.0–10.5)

## 2013-09-29 NOTE — Patient Instructions (Signed)
Diarrhea  Diarrhea is frequent loose and watery bowel movements. It can cause you to feel weak and dehydrated. Dehydration can cause you to become tired and thirsty, have a dry mouth, and have decreased urination that often is dark yellow. Diarrhea is a sign of another problem, most often an infection that will not last long. In most cases, diarrhea typically lasts 2-3 days. However, it can last longer if it is a sign of something more serious. It is important to treat your diarrhea as directed by your caregive to lessen or prevent future episodes of diarrhea.  CAUSES   Some common causes include:   Gastrointestinal infections caused by viruses, bacteria, or parasites.   Food poisoning or food allergies.   Certain medicines, such as antibiotics, chemotherapy, and laxatives.   Artificial sweeteners and fructose.   Digestive disorders.  HOME CARE INSTRUCTIONS   Ensure adequate fluid intake (hydration): have 1 cup (8 oz) of fluid for each diarrhea episode. Avoid fluids that contain simple sugars or sports drinks, fruit juices, whole milk products, and sodas. Your urine should be clear or pale yellow if you are drinking enough fluids. Hydrate with an oral rehydration solution that you can purchase at pharmacies, retail stores, and online. You can prepare an oral rehydration solution at home by mixing the following ingredients together:    - tsp table salt.    tsp baking soda.    tsp salt substitute containing potassium chloride.   1  tablespoons sugar.   1 L (34 oz) of water.   Certain foods and beverages may increase the speed at which food moves through the gastrointestinal (GI) tract. These foods and beverages should be avoided and include:   Caffeinated and alcoholic beverages.   High-fiber foods, such as raw fruits and vegetables, nuts, seeds, and whole grain breads and cereals.   Foods and beverages sweetened with sugar alcohols, such as xylitol, sorbitol, and mannitol.   Some foods may be well  tolerated and may help thicken stool including:   Starchy foods, such as rice, toast, pasta, low-sugar cereal, oatmeal, grits, baked potatoes, crackers, and bagels.   Bananas.   Applesauce.   Add probiotic-rich foods to help increase healthy bacteria in the GI tract, such as yogurt and fermented milk products.   Wash your hands well after each diarrhea episode.   Only take over-the-counter or prescription medicines as directed by your caregiver.   Take a warm bath to relieve any burning or pain from frequent diarrhea episodes.  SEEK IMMEDIATE MEDICAL CARE IF:    You are unable to keep fluids down.   You have persistent vomiting.   You have blood in your stool, or your stools are black and tarry.   You do not urinate in 6-8 hours, or there is only a small amount of very dark urine.   You have abdominal pain that increases or localizes.   You have weakness, dizziness, confusion, or lightheadedness.   You have a severe headache.   Your diarrhea gets worse or does not get better.   You have a fever or persistent symptoms for more than 2-3 days.   You have a fever and your symptoms suddenly get worse.  MAKE SURE YOU:    Understand these instructions.   Will watch your condition.   Will get help right away if you are not doing well or get worse.  Document Released: 03/13/2002 Document Revised: 03/09/2012 Document Reviewed: 11/29/2011  [employer] Patient Information 2015 [employer]. This information   is not intended to replace advice given to you by your health care provider. Make sure you discuss any questions you have with your health care provider.

## 2013-09-29 NOTE — Progress Notes (Signed)
Pre visit review using our clinic review tool, if applicable. No additional management support is needed unless otherwise documented below in the visit note. 

## 2013-09-29 NOTE — Progress Notes (Signed)
   Subjective:    Patient ID: Stacey Ruiz, female    DOB: 1975-12-15, 38 y.o.   MRN: [id number]  Diarrhea  This is a new problem. The current episode started 1 to 4 weeks ago. The problem occurs 2 to 4 times per day. The problem has been unchanged. The stool consistency is described as watery. Pertinent negatives include no abdominal pain, arthralgias, bloating, chills, coughing, fever, headaches, increased  flatus, myalgias, sweats, URI, vomiting or weight loss. Nothing aggravates the symptoms. Risk factors include recent antibiotic use. She has tried nothing for the symptoms. The treatment provided no relief.      Review of Systems  Constitutional: Negative.  Negative for fever, chills, weight loss, diaphoresis, appetite change and fatigue.  HENT: Negative.   Eyes: Negative.   Respiratory: Negative.  Negative for cough, choking, chest tightness, shortness of breath and stridor.   Cardiovascular: Negative.  Negative for chest pain, palpitations and leg swelling.  Gastrointestinal: Positive for diarrhea. Negative for nausea, vomiting, abdominal pain, constipation, blood in stool, abdominal distention, anal bleeding, rectal pain, bloating and flatus.  Endocrine: Negative.   Genitourinary: Negative.   Musculoskeletal: Negative.  Negative for arthralgias, back pain, myalgias, neck pain and neck stiffness.  Skin: Negative.  Negative for rash.  Allergic/Immunologic: Negative.   Neurological: Negative.  Negative for headaches.  Hematological: Negative.  Negative for adenopathy. Does not bruise/bleed easily.  Psychiatric/Behavioral: Negative.        Objective:   Physical Exam  Vitals reviewed. Constitutional: She is oriented to person, place, and time. She appears well-developed and well-nourished.  Non-toxic appearance. She does not have a sickly appearance. She does not appear ill. No distress.  HENT:  Head: Normocephalic and atraumatic.  Mouth/Throat: Oropharynx is clear and moist.  No oropharyngeal exudate.  Eyes: Conjunctivae are normal. Right eye exhibits no discharge. Left eye exhibits no discharge. No scleral icterus.  Neck: Normal range of motion. Neck supple. No JVD present. No tracheal deviation present. No thyromegaly present.  Cardiovascular: Normal rate, regular rhythm, normal heart sounds and intact distal pulses.  Exam reveals no gallop and no friction rub.   No murmur heard. Pulmonary/Chest: Effort normal and breath sounds normal. No stridor. No respiratory distress. She has no wheezes. She has no rales. She exhibits no tenderness.  Abdominal: Soft. Bowel sounds are normal. She exhibits no distension and no mass. There is no tenderness. There is no rebound and no guarding.  Musculoskeletal: Normal range of motion. She exhibits no edema and no tenderness.  Lymphadenopathy:    She has no cervical adenopathy.  Neurological: She is oriented to person, place, and time.  Skin: Skin is warm and dry. No rash noted. She is not diaphoretic. No erythema. No pallor.     Lab Results  Component Value Date   WBC 17.4* 09/07/2013   HGB 12.5 09/07/2013   HCT 37.3 09/07/2013   PLT 253 09/07/2013   GLUCOSE 121* 09/07/2013   CHOL 129 09/07/2011   TRIG 147.0 09/07/2011   HDL 39.10 09/07/2011   LDLCALC 61 09/07/2011   ALT 14 09/07/2013   AST 19 09/07/2013   NA 135* 09/07/2013   K 3.6* 09/07/2013   CL 98 09/07/2013   CREATININE 0.82 09/07/2013   BUN 10 09/07/2013   CO2 24 09/07/2013   TSH 1.533 11/28/2012   INR 0.9 05/20/2011   HGBA1C 5.9 09/17/2011       Assessment & Plan:

## 2013-09-30 ENCOUNTER — Encounter: Payer: Self-pay | Admitting: Internal Medicine

## 2013-09-30 MED ORDER — ALIGN 4 MG PO CAPS: 1.0000 | ORAL_CAPSULE | Freq: Every day | ORAL | Status: DC

## 2013-09-30 MED ORDER — LOPERAMIDE HCL 2 MG PO TABS: 2.0000 mg | ORAL_TABLET | Freq: Four times a day (QID) | ORAL | Status: DC | PRN

## 2013-09-30 NOTE — Assessment & Plan Note (Signed)
Her CBC is normal now Will check an SPEP to screen for lymphoproliferative disease

## 2013-09-30 NOTE — Assessment & Plan Note (Signed)
Her WBC is normal so I don't think she has C diff infection, but will check stool for C diff to confirm Will also check stool for lactoferrin to screen for types of infectious diarrhea I think she has antibiotic associated diarrhea and have asked her to start a probiotic and immodium-ad

## 2013-10-02 ENCOUNTER — Other Ambulatory Visit: Payer: BC Managed Care – PPO

## 2013-10-02 ENCOUNTER — Other Ambulatory Visit: Payer: Self-pay | Admitting: Internal Medicine

## 2013-10-02 DIAGNOSIS — R197 Diarrhea, unspecified: Secondary | ICD-10-CM

## 2013-10-02 DIAGNOSIS — D72829 Elevated white blood cell count, unspecified: Secondary | ICD-10-CM

## 2013-10-03 LAB — SPEP & IFE WITH QIG
ALBUMIN ELP: 51.7 % — AB (ref 55.8–66.1)
ALPHA-1-GLOBULIN: 6.7 % — AB (ref 2.9–4.9)
ALPHA-2-GLOBULIN: 10.7 % (ref 7.1–11.8)
Beta 2: 5.4 % (ref 3.2–6.5)
Beta Globulin: 6.9 % (ref 4.7–7.2)
GAMMA GLOBULIN: 18.6 % (ref 11.1–18.8)
IGM, SERUM: 221 mg/dL (ref 52–322)
IgA: 324 mg/dL (ref 69–380)
IgG (Immunoglobin G), Serum: 1400 mg/dL (ref 690–1700)
Total Protein, Serum Electrophoresis: 7.4 g/dL (ref 6.0–8.3)

## 2013-10-03 LAB — C. DIFFICILE GDH AND TOXIN A/B
C. DIFF TOXIN A/B: NOT DETECTED
C. difficile GDH: NOT DETECTED

## 2013-10-03 LAB — FECAL LACTOFERRIN, QUANT: Lactoferrin: NEGATIVE

## 2013-11-12 ENCOUNTER — Encounter (HOSPITAL_COMMUNITY): Payer: Self-pay | Admitting: Emergency Medicine

## 2013-11-12 ENCOUNTER — Emergency Department (HOSPITAL_COMMUNITY)
Admission: EM | Admit: 2013-11-12 | Discharge: 2013-11-12 | Disposition: A | Discharge status: Home / Self Care | Payer: BC Managed Care – PPO | Attending: Emergency Medicine | Admitting: Emergency Medicine

## 2013-11-12 DIAGNOSIS — S80211A Abrasion, right knee, initial encounter: Secondary | ICD-10-CM

## 2013-11-12 DIAGNOSIS — T7421XA Adult sexual abuse, confirmed, initial encounter: Secondary | ICD-10-CM | POA: Insufficient documentation

## 2013-11-12 DIAGNOSIS — F17200 Nicotine dependence, unspecified, uncomplicated: Secondary | ICD-10-CM | POA: Diagnosis not present

## 2013-11-12 DIAGNOSIS — R51 Headache: Secondary | ICD-10-CM | POA: Insufficient documentation

## 2013-11-12 DIAGNOSIS — T7491XA Unspecified adult maltreatment, confirmed, initial encounter: Secondary | ICD-10-CM | POA: Insufficient documentation

## 2013-11-12 DIAGNOSIS — F411 Generalized anxiety disorder: Secondary | ICD-10-CM | POA: Insufficient documentation

## 2013-11-12 DIAGNOSIS — M542 Cervicalgia: Secondary | ICD-10-CM | POA: Diagnosis not present

## 2013-11-12 DIAGNOSIS — IMO0002 Reserved for concepts with insufficient information to code with codable children: Secondary | ICD-10-CM | POA: Diagnosis not present

## 2013-11-12 DIAGNOSIS — I10 Essential (primary) hypertension: Secondary | ICD-10-CM | POA: Insufficient documentation

## 2013-11-12 DIAGNOSIS — T7492XA Unspecified child maltreatment, confirmed, initial encounter: Secondary | ICD-10-CM | POA: Insufficient documentation

## 2013-11-12 DIAGNOSIS — Z79899 Other long term (current) drug therapy: Secondary | ICD-10-CM | POA: Insufficient documentation

## 2013-11-12 HISTORY — DX: Essential (primary) hypertension: I10

## 2013-11-12 MED ORDER — IBUPROFEN 800 MG PO TABS
800.0000 mg | ORAL_TABLET | Freq: Once | ORAL | Status: AC
  Administered 2013-11-12: 800 mg via ORAL
  Filled 2013-11-12: qty 1

## 2013-11-12 MED ORDER — PROMETHAZINE HCL 25 MG PO TABS
ORAL_TABLET | ORAL | Status: AC
  Administered 2013-11-12: 75 mg
  Filled 2013-11-12: qty 3

## 2013-11-12 MED ORDER — AZITHROMYCIN 1 G PO PACK
PACK | ORAL | Status: AC
  Administered 2013-11-12: 1 g
  Filled 2013-11-12: qty 1

## 2013-11-12 MED ORDER — LEVONORGESTREL 0.75 MG PO TABS
ORAL_TABLET | ORAL | Status: AC
  Filled 2013-11-12: qty 2

## 2013-11-12 MED ORDER — CEFIXIME 400 MG PO TABS
ORAL_TABLET | ORAL | Status: AC
  Administered 2013-11-12: 400 mg
  Filled 2013-11-12: qty 1

## 2013-11-12 MED ORDER — METRONIDAZOLE 500 MG PO TABS
ORAL_TABLET | ORAL | Status: AC
  Administered 2013-11-12: 2000 mg
  Filled 2013-11-12: qty 4
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
## 2013-11-12 NOTE — Discharge Instructions (Signed)
Abrasion An abrasion is a cut or scrape of the skin. Abrasions do not extend through all layers of the skin and most heal within 10 days. It is important to care for your abrasion properly to prevent infection. CAUSES  Most abrasions are caused by falling on, or gliding across, the ground or other surface. When your skin rubs on something, the outer and inner layer of skin rubs off, causing an abrasion. DIAGNOSIS  Your caregiver will be able to diagnose an abrasion during a physical exam.  TREATMENT  Your treatment depends on how large and deep the abrasion is. Generally, your abrasion will be cleaned with water and a mild soap to remove any dirt or debris. An antibiotic ointment may be put over the abrasion to prevent an infection. A bandage (dressing) may be wrapped around the abrasion to keep it from getting dirty.  You may need a tetanus shot if:  You cannot remember when you had your last tetanus shot.  You have never had a tetanus shot.  The injury broke your skin. If you get a tetanus shot, your arm may swell, get red, and feel warm to the touch. This is common and not a problem. If you need a tetanus shot and you choose not to have one, there is a rare chance of getting tetanus. Sickness from tetanus can be serious.  HOME CARE INSTRUCTIONS   If a dressing was applied, change it at least once a day or as directed by your caregiver. If the bandage sticks, soak it off with warm water.   Wash the area with water and a mild soap to remove all the ointment 2 times a day. Rinse off the soap and pat the area dry with a clean towel.   Reapply any ointment as directed by your caregiver. This will help prevent infection and keep the bandage from sticking. Use gauze over the wound and under the dressing to help keep the bandage from sticking.   Change your dressing right away if it becomes wet or dirty.   Only take over-the-counter or prescription medicines for pain, discomfort, or fever as  directed by your caregiver.   Follow up with your caregiver within 24-48 hours for a wound check, or as directed. If you were not given a wound-check appointment, look closely at your abrasion for redness, swelling, or pus. These are signs of infection. SEEK IMMEDIATE MEDICAL CARE IF:   You have increasing pain in the wound.   You have redness, swelling, or tenderness around the wound.   You have pus coming from the wound.   You have a fever or persistent symptoms for more than 2-3 days.  You have a fever and your symptoms suddenly get worse.  You have a bad smell coming from the wound or dressing.  MAKE SURE YOU:   Understand these instructions.  Will watch your condition.  Will get help right away if you are not doing well or get worse. Document Released: 12/31/2004 Document Revised: 03/09/2012 Document Reviewed: 02/24/2011 [facility] Patient Information 2015 [place], [place]. This information is not intended to replace advice given to you by your health care provider. Make sure you discuss any questions you have with your health care provider. Sexual Assault or Rape Sexual assault is any sexual activity that a person is forced, threatened, or coerced into participating in. It may or may not involve physical contact. You are being sexually abused if you are forced to have sexual contact of any kind. Sexual  assault is called rape if penetration has occurred (vaginal, oral, or anal). Many times, sexual assaults are committed by a friend, relative, or associate. Sexual assault and rape are never the victim's fault.  Sexual assault can result in various health problems for the person who was assaulted. Some of these problems include:  Physical injuries in the genital area or other areas of the body.  Risk of unwanted pregnancy.  Risk of sexually transmitted infections (STIs).  Psychological problems such as anxiety, depression, or posttraumatic stress disorder. WHAT STEPS SHOULD  BE TAKEN AFTER A SEXUAL ASSAULT? If you have been sexually assaulted, you should take the following steps as soon as possible:  Go to a safe area as quickly as possible and call your local emergency services (911 in [place] Get away from the area where you have been attacked.   Do not wash, shower, comb your hair, or clean any part of your body.   Do not change your clothes.   Do not remove or touch anything in the area where you were assaulted.   Go to an emergency room for a complete physical exam. Get the necessary tests to protect yourself from STIs or pregnancy. You may be treated for an STI even if no signs of one are present. Emergency contraceptive medicines are also available to help prevent pregnancy, if this is desired. You may need to be examined by a specially trained health care provider.  Have the health care provider collect evidence during the exam, even if you are not sure if you will file a report with the police.  Find out how to file the correct papers with the authorities. This is important for all assaults, even if they were committed by a family member or friend.  Find out where you can get additional help and support, such as a local rape crisis center.  Follow up with your health care provider as directed.  HOW CAN YOU REDUCE THE CHANCES OF SEXUAL ASSAULT? Take the following steps to help reduce your chances of being sexually assaulted:  Consider carrying mace or pepper spray for protection against an attacker.   Consider taking a self-defense course.  Do not try to fight off an attacker if he or she has a gun or knife.   Be aware of your surroundings, what is happening around you, and who might be there.   Be assertive, trust your instincts, and walk with confidence and direction.  Be careful not to drink too much alcohol or use other intoxicants. These can reduce your ability to fight off an assault.  Always lock your doors and windows. Be sure to  have high-quality locks for your home.   Do not let people enter your house if you do not know them.   Get a home security system that has a siren if you are able.   Protect the keys to your house and car. Do not lend them out. Do not put your name and address on them. If you lose them, get your locks changed.   Always lock your car and have your key ready to open the door before approaching the car.   Park in a well-lit and busy area.  Plan your driving routes so that you travel on well-lit and frequently used streets.  Keep your car serviced. Always have at least half a tank of gas in it.   Do not go into isolated areas alone. This includes open garages, empty buildings or offices, or public  laundry rooms.   Do not walk or jog alone, especially when it is dark.   Never hitchhike.   If your car breaks down, call the police for help on your cell phone and stay inside the car with your doors locked and windows up.   If you are being followed, go to a busy area and call for help.   If you are stopped by a police officer, especially one in an unmarked police car, keep your door locked. Do not put your window down all the way. Ask the officer to show you identification first.   Be aware of "date rape drugs" that can be placed in a drink when you are not looking. These drugs can make you unable to fight off an assault. FOR MORE INFORMATION  Office on [employer], [place] Department of Health and Human Services: [employer]  National Sexual Assault Hotline: 1-800-656-HOPE 845-646-8812)  [place]: 1-800-799-SAFE 865-010-6095) or www.thehotline.org Document Released: 03/20/2000 Document Revised: 11/23/2012 Document Reviewed: 08/24/2012 [facility] Patient Information 2015 [place], [place]. This information is not intended to replace advice given to you by your health care provider. Make  sure you discuss any questions you have with your health care provider.

## 2013-11-12 NOTE — ED Notes (Signed)
Spoke with Mendel Ryder, SANE RN,  and she stated she will be here around 1740.
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
## 2013-11-12 NOTE — ED Provider Notes (Signed)
Medical screening examination/treatment/procedure(s) were performed by non-physician practitioner and as supervising physician I was immediately available for consultation/collaboration.   EKG Interpretation None        Hoy Morn, MD 11/12/13 1806

## 2013-11-12 NOTE — ED Provider Notes (Signed)
Medical screening examination/treatment/procedure(s) were performed by non-physician practitioner and as supervising physician I was immediately available for consultation/collaboration.   EKG Interpretation None        Hoy Morn, MD 11/12/13 1744
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
## 2013-12-08 ENCOUNTER — Telehealth: Payer: Self-pay | Admitting: *Deleted

## 2013-12-08 ENCOUNTER — Other Ambulatory Visit: Payer: Self-pay | Admitting: *Deleted

## 2013-12-08 DIAGNOSIS — B379 Candidiasis, unspecified: Secondary | ICD-10-CM

## 2013-12-08 MED ORDER — FLUCONAZOLE 150 MG PO TABS: 150.0000 mg | ORAL_TABLET | Freq: Once | ORAL | Status: DC

## 2013-12-08 NOTE — Telephone Encounter (Signed)
Patient called stating she needs an Rx for a yeast infection.   CB: patient state she is having a little itching, irritation and burning. Patient denies vaginal discharge or odor. Per Nursing protocol patient notified Rx would be sent to pharmacy.

## 2013-12-26 ENCOUNTER — Ambulatory Visit: Payer: BC Managed Care – PPO | Admitting: Obstetrics

## 2014-01-30 ENCOUNTER — Ambulatory Visit (INDEPENDENT_AMBULATORY_CARE_PROVIDER_SITE_OTHER): Payer: BC Managed Care – PPO | Admitting: Obstetrics

## 2014-01-30 ENCOUNTER — Encounter: Payer: Self-pay | Admitting: Obstetrics

## 2014-01-30 ENCOUNTER — Other Ambulatory Visit: Payer: Self-pay | Admitting: Obstetrics

## 2014-01-30 VITALS — BP 147/97 | HR 65 | Wt 209.0 lb

## 2014-01-30 DIAGNOSIS — Z01419 Encounter for gynecological examination (general) (routine) without abnormal findings: Secondary | ICD-10-CM

## 2014-01-30 NOTE — Progress Notes (Signed)
Subjective:     Stacey Ruiz is a 38 y.o. female here for a routine exam.  Current complaints: none.    Personal health questionnaire:  Is patient Ashkenazi Jewish, have a family history of breast and/or ovarian cancer: no Is there a family history of uterine cancer diagnosed at age < 92, gastrointestinal cancer, urinary tract cancer, family member who is a Field seismologist syndrome-associated carrier: no Is the patient overweight and hypertensive, family history of diabetes, personal history of gestational diabetes or PCOS: no Is patient over 81, have PCOS,  family history of premature CHD under age 33, diabetes, smoke, have hypertension or peripheral artery disease:  no At any time, has a partner hit, kicked or otherwise hurt or frightened you?: no Over the past 2 weeks, have you felt down, depressed or hopeless?: no Over the past 2 weeks, have you felt little interest or pleasure in doing things?:no   Gynecologic History Patient's last menstrual period was 01/29/2014. Contraception: tubal ligation Last Pap: 2014. Results were: normal Last mammogram: n/a. Results were: n/a  Obstetric History OB History  Gravida Para Term Preterm AB SAB TAB Ectopic Multiple Living  1             # Outcome Date GA Lbr Len/2nd Weight Sex Delivery Anes PTL Lv  1 GRA              Comments: System Generated. Please review and update pregnancy details.      Past Medical History  Diagnosis Date  . Headache(784.0)     migraines  . Anxiety   . Depression   . Hypertension     Past Surgical History  Procedure Laterality Date  . Tubal ligation  11/28/2010    Procedure: ESSURE TUBAL STERILIZATION;  Surgeon: Agnes Lawrence, MD;  Location: [facility];  Service: Gynecology;  Laterality: N/A;  Attempted essure sterilization. Essure device placed in leftt side only. could not do procedure on right fallopian tube.   . Tubal ligation  12/05/2010    Procedure: ESSURE TUBAL STERILIZATION;  Surgeon: Agnes Lawrence, MD;  Location: [facility];  Service: Gynecology;  Laterality: Right;  . Fracture surgery  2002    left wrist  . Ablation      Current outpatient prescriptions:amLODipine (NORVASC) 5 MG tablet, Take 1 tablet (5 mg total) by mouth daily., Disp: 90 tablet, Rfl: 3;  diazepam (VALIUM) 5 MG tablet, Take 1 tablet (5 mg total) by mouth every 12 (twelve) hours as needed for anxiety., Disp: 30 tablet, Rfl: 2;  irbesartan (AVAPRO) 300 MG tablet, Take 1 tablet (300 mg total) by mouth daily., Disp: 90 tablet, Rfl: 3 No Known Allergies  History  Substance Use Topics  . Smoking status: Current Some Day Smoker    Types: Cigarettes    Last Attempt to Quit: 11/12/2007  . Smokeless tobacco: Never Used  . Alcohol Use: 4.2 oz/week    7 Glasses of wine per week    Family History  Problem Relation Age of Onset  . Diabetes Neg Hx   . Early death Neg Hx   . Heart disease Neg Hx   . Hyperlipidemia Neg Hx   . Hypertension Neg Hx   . Kidney disease Neg Hx   . Stroke Neg Hx   . Cancer Mother       Review of Systems  Constitutional: negative for fatigue and weight loss Respiratory: negative for cough and wheezing Cardiovascular: negative for chest pain, fatigue and palpitations Gastrointestinal: negative for  abdominal pain and change in bowel habits Musculoskeletal:negative for myalgias Neurological: negative for gait problems and tremors Behavioral/Psych: negative for abusive relationship, depression Endocrine: negative for temperature intolerance   Genitourinary:negative for abnormal menstrual periods, genital lesions, hot flashes, sexual problems and vaginal discharge Integument/breast: negative for breast lump, breast tenderness, nipple discharge and skin lesion(s)    Objective:       BP 147/97  Pulse 65  Wt 209 lb (94.802 kg)  LMP 01/29/2014 General:   alert  Skin:   no rash or abnormalities  Lungs:   clear to auscultation bilaterally  Heart:   regular rate and rhythm, S1, S2  normal, no murmur, click, rub or gallop  Breasts:   normal without suspicious masses, skin or nipple changes or axillary nodes  Abdomen:  normal findings: no organomegaly, soft, non-tender and no hernia  Pelvis:  External genitalia: normal general appearance Urinary system: urethral meatus normal and bladder without fullness, nontender Vaginal: normal without tenderness, induration or masses.  Menstrual blood present. Cervix: normal appearance Adnexa: normal bimanual exam Uterus: anteverted and non-tender, normal size   Lab Review Urine pregnancy test Labs reviewed yes Radiologic studies reviewed no    Assessment:    Healthy female exam.    Plan:    Education reviewed: low fat, low cholesterol diet. Follow up in: 1 year.   No orders of the defined types were placed in this encounter.   Orders Placed This Encounter  Procedures  . WET PREP BY MOLECULAR PROBE  . GC/Chlamydia Probe Amp

## 2014-01-31 ENCOUNTER — Other Ambulatory Visit: Payer: Self-pay | Admitting: Obstetrics

## 2014-01-31 DIAGNOSIS — B9689 Other specified bacterial agents as the cause of diseases classified elsewhere: Secondary | ICD-10-CM

## 2014-01-31 DIAGNOSIS — N760 Acute vaginitis: Principal | ICD-10-CM

## 2014-01-31 LAB — WET PREP BY MOLECULAR PROBE
CANDIDA SPECIES: NEGATIVE
Gardnerella vaginalis: POSITIVE — AB
Trichomonas vaginosis: NEGATIVE

## 2014-01-31 LAB — GC/CHLAMYDIA PROBE AMP
CT Probe RNA: NEGATIVE
GC PROBE AMP APTIMA: NEGATIVE

## 2014-01-31 MED ORDER — METRONIDAZOLE 500 MG PO TABS: 500.0000 mg | ORAL_TABLET | Freq: Two times a day (BID) | ORAL | Status: DC

## 2014-02-01 LAB — PAP IG AND HPV HIGH-RISK: HPV DNA HIGH RISK: NOT DETECTED

## 2014-02-04 ENCOUNTER — Other Ambulatory Visit: Payer: Self-pay | Admitting: Obstetrics

## 2014-02-05 ENCOUNTER — Encounter: Payer: Self-pay | Admitting: Obstetrics

## 2014-03-07 ENCOUNTER — Telehealth: Payer: Self-pay | Admitting: Internal Medicine

## 2014-03-07 DIAGNOSIS — M25561 Pain in right knee: Secondary | ICD-10-CM

## 2014-03-07 NOTE — Telephone Encounter (Signed)
Pt requesting referral to orthopedic, for R knee pain, swelling. Use to run and just got back into running again, pt has elevated and used ice but not helping.Pls advise.  [phone]

## 2014-03-07 NOTE — Telephone Encounter (Signed)
done

## 2014-03-09 ENCOUNTER — Telehealth: Payer: Self-pay | Admitting: *Deleted

## 2014-03-09 NOTE — Telephone Encounter (Signed)
Patient called regarding a medication for BV.  Attempted to contact patient and unable to leave message.

## 2014-03-14 ENCOUNTER — Ambulatory Visit: Payer: BC Managed Care – PPO | Admitting: Family Medicine

## 2014-03-21 NOTE — Telephone Encounter (Signed)
Patient reports she was taken care of and has refills.

## 2014-04-03 ENCOUNTER — Ambulatory Visit: Payer: BC Managed Care – PPO | Admitting: Internal Medicine

## 2014-04-18 ENCOUNTER — Ambulatory Visit (INDEPENDENT_AMBULATORY_CARE_PROVIDER_SITE_OTHER)
Admission: RE | Admit: 2014-04-18 | Discharge: 2014-04-18 | Disposition: A | Discharge status: Home / Self Care | Payer: BLUE CROSS/BLUE SHIELD | Source: Ambulatory Visit | Attending: Internal Medicine | Admitting: Internal Medicine

## 2014-04-18 ENCOUNTER — Encounter: Payer: Self-pay | Admitting: Internal Medicine

## 2014-04-18 ENCOUNTER — Ambulatory Visit (INDEPENDENT_AMBULATORY_CARE_PROVIDER_SITE_OTHER): Payer: BLUE CROSS/BLUE SHIELD | Admitting: Internal Medicine

## 2014-04-18 VITALS — BP 124/86 | HR 80 | Temp 98.5°F | Resp 16 | Ht 65.0 in | Wt 208.0 lb

## 2014-04-18 DIAGNOSIS — R05 Cough: Secondary | ICD-10-CM

## 2014-04-18 DIAGNOSIS — R059 Cough, unspecified: Secondary | ICD-10-CM

## 2014-04-18 DIAGNOSIS — I10 Essential (primary) hypertension: Secondary | ICD-10-CM

## 2014-04-18 DIAGNOSIS — J202 Acute bronchitis due to streptococcus: Secondary | ICD-10-CM

## 2014-04-18 DIAGNOSIS — E669 Obesity, unspecified: Secondary | ICD-10-CM

## 2014-04-18 MED ORDER — HYDROCOD POLST-CHLORPHEN POLST 10-8 MG/5ML PO LQCR: 5.0000 mL | Freq: Two times a day (BID) | ORAL | Status: DC | PRN

## 2014-04-18 MED ORDER — PHENTERMINE HCL 37.5 MG PO CAPS: 37.5000 mg | ORAL_CAPSULE | ORAL | Status: DC

## 2014-04-18 MED ORDER — AMOXICILLIN-POT CLAVULANATE 875-125 MG PO TABS: 1.0000 | ORAL_TABLET | Freq: Two times a day (BID) | ORAL | Status: DC

## 2014-04-18 NOTE — Assessment & Plan Note (Signed)
She has been working on her lifestyle modifications Will also try phentermine to help her lose wt

## 2014-04-18 NOTE — Assessment & Plan Note (Signed)
Her BP is well controlled 

## 2014-04-18 NOTE — Assessment & Plan Note (Signed)
Her CXR is normal.

## 2014-04-18 NOTE — Assessment & Plan Note (Signed)
I will treat the infection with augmentin and will control the cough with tussionex susp

## 2014-04-18 NOTE — Progress Notes (Signed)
Pre visit review using our clinic review tool, if applicable. No additional management support is needed unless otherwise documented below in the visit note. 

## 2014-04-18 NOTE — Progress Notes (Signed)
   Subjective:    Patient ID: Stacey Ruiz, female    DOB: September 25, 1975, 39 y.o.   MRN: [id number]  Cough This is a recurrent problem. The current episode started 1 to 4 weeks ago. The problem has been gradually worsening. The problem occurs every few hours. The cough is productive of purulent sputum. Associated symptoms include chills, nasal congestion, postnasal drip, rhinorrhea and a sore throat. Pertinent negatives include no chest pain, ear congestion, ear pain, fever, heartburn, hemoptysis, myalgias, rash, shortness of breath, weight loss or wheezing. She has tried OTC cough suppressant for the symptoms. The treatment provided no relief. Her past medical history is significant for pneumonia. There is no history of asthma, bronchiectasis, bronchitis, COPD, emphysema or environmental allergies.      Review of Systems  Constitutional: Positive for chills. Negative for fever, weight loss, diaphoresis, activity change, appetite change and fatigue.  HENT: Positive for postnasal drip, rhinorrhea and sore throat. Negative for ear pain, sinus pressure, sneezing, tinnitus and trouble swallowing.   Eyes: Negative.   Respiratory: Positive for cough. Negative for hemoptysis, choking, chest tightness, shortness of breath, wheezing and stridor.   Cardiovascular: Negative.  Negative for chest pain, palpitations and leg swelling.  Gastrointestinal: Negative.  Negative for heartburn, vomiting, abdominal pain, diarrhea, constipation and blood in stool.  Endocrine: Negative.   Genitourinary: Negative.   Musculoskeletal: Negative.  Negative for myalgias.  Skin: Negative.  Negative for rash.  Allergic/Immunologic: Negative.  Negative for environmental allergies.  Neurological: Negative.   Hematological: Negative.  Negative for adenopathy. Does not bruise/bleed easily.  Psychiatric/Behavioral: Negative.        Objective:   Physical Exam  Constitutional: She is oriented to person, place, and time. She  appears well-developed and well-nourished. No distress.  HENT:  Head: Normocephalic and atraumatic.  Mouth/Throat: Oropharynx is clear and moist. No oropharyngeal exudate.  Eyes: Conjunctivae are normal. Right eye exhibits no discharge. Left eye exhibits no discharge. No scleral icterus.  Neck: Normal range of motion. Neck supple. No JVD present. No tracheal deviation present. No thyromegaly present.  Cardiovascular: Normal rate, regular rhythm, normal heart sounds and intact distal pulses.  Exam reveals no gallop and no friction rub.   No murmur heard. Pulmonary/Chest: Effort normal and breath sounds normal. No stridor. No respiratory distress. She has no wheezes. She has no rales. She exhibits no tenderness.  Abdominal: Soft. Bowel sounds are normal. She exhibits no distension and no mass. There is no tenderness. There is no rebound and no guarding.  Musculoskeletal: Normal range of motion. She exhibits no edema or tenderness.  Lymphadenopathy:    She has no cervical adenopathy.  Neurological: She is oriented to person, place, and time.  Skin: Skin is warm and dry. No rash noted. She is not diaphoretic. No erythema. No pallor.  Psychiatric: She has a normal mood and affect. Her behavior is normal. Judgment and thought content normal.  Vitals reviewed.    Dg Chest 2 View  04/18/2014   CLINICAL DATA:  Cough and chills.  EXAM: CHEST  2 VIEW  COMPARISON:  09/15/2013.  FINDINGS: The heart size and mediastinal contours are within normal limits. Both lungs are clear. The visualized skeletal structures are unremarkable.  IMPRESSION: No acute cardiopulmonary disease. Previously identified atelectatic changes in the right mid lung field has cleared. No focal infiltrate.   Electronically Signed   By: Marcello Moores  Register   On: 04/18/2014 09:34       Assessment & Plan:

## 2014-04-24 ENCOUNTER — Telehealth: Payer: Self-pay | Admitting: *Deleted

## 2014-04-24 DIAGNOSIS — B373 Candidiasis of vulva and vagina: Secondary | ICD-10-CM

## 2014-04-24 DIAGNOSIS — B3731 Acute candidiasis of vulva and vagina: Secondary | ICD-10-CM

## 2014-04-24 MED ORDER — FLUCONAZOLE 150 MG PO TABS: 150.0000 mg | ORAL_TABLET | ORAL | Status: DC

## 2014-04-24 NOTE — Telephone Encounter (Signed)
Patient states she was recently given Augmentin and now she has a yeast infection- patient is requesting Rx. Rx fpr Diflucan sent to patient pharmacy per provider permission.

## 2014-05-09 ENCOUNTER — Telehealth: Payer: Self-pay | Admitting: *Deleted

## 2014-05-09 ENCOUNTER — Other Ambulatory Visit: Payer: Self-pay | Admitting: Obstetrics

## 2014-05-09 ENCOUNTER — Telehealth: Payer: Self-pay | Admitting: Internal Medicine

## 2014-05-09 DIAGNOSIS — B372 Candidiasis of skin and nail: Secondary | ICD-10-CM

## 2014-05-09 MED ORDER — IRBESARTAN 300 MG PO TABS: 300.0000 mg | ORAL_TABLET | Freq: Every day | ORAL | Status: DC

## 2014-05-09 MED ORDER — NYSTATIN 100000 UNIT/GM EX POWD: CUTANEOUS | Status: DC

## 2014-05-09 NOTE — Telephone Encounter (Signed)
Nystatin powder Rx.

## 2014-05-09 NOTE — Telephone Encounter (Signed)
done

## 2014-05-09 NOTE — Telephone Encounter (Signed)
Is requesting refill of avapro to be sent to [facility] on [address].

## 2014-05-09 NOTE — Telephone Encounter (Signed)
Patient states she has yeast under her R breast and would like a cream sent in. Will forward to Dr Jodi Mourning for treatment.

## 2014-05-10 NOTE — Telephone Encounter (Signed)
Tried to contact patient- VM not set up.

## 2014-06-20 ENCOUNTER — Other Ambulatory Visit (INDEPENDENT_AMBULATORY_CARE_PROVIDER_SITE_OTHER): Payer: BLUE CROSS/BLUE SHIELD

## 2014-06-20 ENCOUNTER — Encounter: Payer: Self-pay | Admitting: Internal Medicine

## 2014-06-20 ENCOUNTER — Ambulatory Visit (INDEPENDENT_AMBULATORY_CARE_PROVIDER_SITE_OTHER): Payer: BLUE CROSS/BLUE SHIELD | Admitting: Internal Medicine

## 2014-06-20 VITALS — BP 122/80 | HR 94 | Temp 98.2°F | Resp 16 | Wt 195.0 lb

## 2014-06-20 DIAGNOSIS — R739 Hyperglycemia, unspecified: Secondary | ICD-10-CM

## 2014-06-20 DIAGNOSIS — I10 Essential (primary) hypertension: Secondary | ICD-10-CM

## 2014-06-20 DIAGNOSIS — E669 Obesity, unspecified: Secondary | ICD-10-CM

## 2014-06-20 LAB — BASIC METABOLIC PANEL
BUN: 9 mg/dL (ref 6–23)
CALCIUM: 9 mg/dL (ref 8.4–10.5)
CO2: 26 mEq/L (ref 19–32)
CREATININE: 0.88 mg/dL (ref 0.40–1.20)
Chloride: 104 mEq/L (ref 96–112)
GFR: 91.48 mL/min (ref >60.00)
Glucose, Bld: 107 mg/dL — ABNORMAL HIGH (ref 70–99)
POTASSIUM: 4.1 meq/L (ref 3.5–5.1)
Sodium: 136 mEq/L (ref 135–145)

## 2014-06-20 LAB — HEMOGLOBIN A1C: Hgb A1c MFr Bld: 5.6 % (ref 4.6–6.5)

## 2014-06-20 MED ORDER — PHENTERMINE HCL 37.5 MG PO CAPS: 37.5000 mg | ORAL_CAPSULE | ORAL | Status: DC

## 2014-06-20 NOTE — Progress Notes (Signed)
Pre visit review using our clinic review tool, if applicable. No additional management support is needed unless otherwise documented below in the visit note. 

## 2014-06-20 NOTE — Assessment & Plan Note (Signed)
She is losing weight with phentermine, will continue

## 2014-06-20 NOTE — Assessment & Plan Note (Signed)
Her BP is well controlled Lytes and renal function are stable 

## 2014-06-20 NOTE — Patient Instructions (Signed)

## 2014-06-20 NOTE — Assessment & Plan Note (Signed)
Her A1C is down She is doing well with her lifestyle modifications

## 2014-06-20 NOTE — Progress Notes (Signed)
   Subjective:    Patient ID: Stacey Ruiz, female    DOB: 11/20/1975, 39 y.o.   MRN: [id number]  Hypertension This is a chronic problem. The current episode started more than 1 year ago. The problem has been gradually improving since onset. The problem is controlled. Pertinent negatives include no anxiety, blurred vision, chest pain, headaches, malaise/fatigue, neck pain, orthopnea, palpitations, peripheral edema, PND, shortness of breath or sweats. Agents associated with hypertension include NSAIDs. Past treatments include calcium channel blockers and angiotensin blockers. The current treatment provides significant improvement. There are no compliance problems.       Review of Systems  Constitutional: Negative.  Negative for fever, chills, malaise/fatigue, diaphoresis, appetite change and fatigue.  HENT: Negative.   Eyes: Negative.  Negative for blurred vision.  Respiratory: Negative.  Negative for cough, choking, chest tightness, shortness of breath and stridor.   Cardiovascular: Negative.  Negative for chest pain, palpitations, orthopnea, leg swelling and PND.  Gastrointestinal: Negative.  Negative for nausea, vomiting, abdominal pain, diarrhea, constipation and blood in stool.  Endocrine: Negative.   Genitourinary: Negative.   Musculoskeletal: Negative.  Negative for myalgias, back pain, arthralgias and neck pain.  Skin: Negative.  Negative for rash.  Allergic/Immunologic: Negative.   Neurological: Negative.  Negative for dizziness, tremors, weakness, light-headedness and headaches.  Hematological: Negative.  Negative for adenopathy. Does not bruise/bleed easily.  Psychiatric/Behavioral: Negative.  Negative for behavioral problems, confusion, sleep disturbance, dysphoric mood, decreased concentration and agitation. The patient is not nervous/anxious.        Objective:   Physical Exam  Constitutional: She is oriented to person, place, and time. She appears well-developed and  well-nourished. No distress.  HENT:  Head: Normocephalic and atraumatic.  Mouth/Throat: Oropharynx is clear and moist. No oropharyngeal exudate.  Eyes: Conjunctivae are normal. Right eye exhibits no discharge. Left eye exhibits no discharge. No scleral icterus.  Neck: Normal range of motion. Neck supple. No JVD present. No tracheal deviation present. No thyromegaly present.  Cardiovascular: Normal rate, regular rhythm, normal heart sounds and intact distal pulses.  Exam reveals no gallop and no friction rub.   No murmur heard. Pulmonary/Chest: Effort normal and breath sounds normal. No stridor. No respiratory distress. She has no wheezes. She has no rales. She exhibits no tenderness.  Abdominal: Soft. Bowel sounds are normal. She exhibits no distension and no mass. There is no tenderness. There is no rebound and no guarding.  Musculoskeletal: Normal range of motion. She exhibits no edema or tenderness.  Lymphadenopathy:    She has no cervical adenopathy.  Neurological: She is oriented to person, place, and time.  Skin: Skin is warm and dry. No rash noted. She is not diaphoretic. No erythema. No pallor.  Vitals reviewed.     Lab Results  Component Value Date   WBC 5.5 09/29/2013   HGB 12.4 09/29/2013   HCT 37.3 09/29/2013   PLT 314.0 09/29/2013   GLUCOSE 121* 09/07/2013   CHOL 129 09/07/2011   TRIG 147.0 09/07/2011   HDL 39.10 09/07/2011   LDLCALC 61 09/07/2011   ALT 14 09/07/2013   AST 19 09/07/2013   NA 135* 09/07/2013   K 3.6* 09/07/2013   CL 98 09/07/2013   CREATININE 0.82 09/07/2013   BUN 10 09/07/2013   CO2 24 09/07/2013   TSH 1.533 11/28/2012   INR 0.9 05/20/2011   HGBA1C 5.9 09/17/2011      Assessment & Plan:

## 2014-06-21 ENCOUNTER — Other Ambulatory Visit: Payer: Self-pay | Admitting: Internal Medicine

## 2014-08-08 ENCOUNTER — Telehealth: Payer: Self-pay | Admitting: *Deleted

## 2014-08-08 DIAGNOSIS — N760 Acute vaginitis: Principal | ICD-10-CM

## 2014-08-08 DIAGNOSIS — B9689 Other specified bacterial agents as the cause of diseases classified elsewhere: Secondary | ICD-10-CM

## 2014-08-08 MED ORDER — METRONIDAZOLE 500 MG PO TABS: 500.0000 mg | ORAL_TABLET | Freq: Two times a day (BID) | ORAL | Status: DC

## 2014-08-08 NOTE — Telephone Encounter (Signed)
Patient is requesting treatment for BV. She states she occasionally has an occurrence and would like something called in. Per provider- OK to treat. Patient notified

## 2015-02-04 ENCOUNTER — Ambulatory Visit: Payer: BC Managed Care – PPO | Admitting: Obstetrics

## 2015-02-15 ENCOUNTER — Ambulatory Visit: Payer: BLUE CROSS/BLUE SHIELD | Admitting: Certified Nurse Midwife

## 2015-04-19 ENCOUNTER — Ambulatory Visit (INDEPENDENT_AMBULATORY_CARE_PROVIDER_SITE_OTHER): Payer: BLUE CROSS/BLUE SHIELD | Admitting: Internal Medicine

## 2015-04-19 ENCOUNTER — Encounter: Payer: Self-pay | Admitting: Internal Medicine

## 2015-04-19 VITALS — BP 170/120 | HR 95 | Temp 98.6°F | Resp 14 | Ht 65.0 in | Wt 202.0 lb

## 2015-04-19 DIAGNOSIS — J069 Acute upper respiratory infection, unspecified: Secondary | ICD-10-CM | POA: Insufficient documentation

## 2015-04-19 DIAGNOSIS — I10 Essential (primary) hypertension: Secondary | ICD-10-CM | POA: Diagnosis not present

## 2015-04-19 MED ORDER — BENZONATATE 200 MG PO CAPS: 200.0000 mg | ORAL_CAPSULE | Freq: Three times a day (TID) | ORAL | Status: DC | PRN

## 2015-04-19 MED ORDER — FLUTICASONE PROPIONATE 50 MCG/ACT NA SUSP: 2.0000 | Freq: Every day | NASAL | Status: DC

## 2015-04-19 NOTE — Assessment & Plan Note (Signed)
Will forward to her PCP that she is not taking meds and have advised her to schedule a visit to discuss as her blood pressure is high today. Did advise her to fill her medicines.

## 2015-04-19 NOTE — Progress Notes (Signed)
   Subjective:    Patient ID: Stacey Ruiz, female    DOB: 07-10-1975, 40 y.o.   MRN: [id number]  HPI The patient is a 40 YO female coming in for cold symptoms for 2 days. She traveled to [place] and coming home developed a cough. She is having sinus pressure and cough with sore throat. No fevers or chills. She is taking nyquil over the counter for the symptoms which is helping some. Some headaches. Overall improving gradually.   She is out of blood pressure medicines and has not filled them. She does not want to refill them as she feels her pressure is fine. Several months out.   Review of Systems  Constitutional: Positive for activity change, appetite change and fatigue. Negative for fever, chills and unexpected weight change.  HENT: Positive for congestion, postnasal drip, rhinorrhea, sinus pressure and sore throat. Negative for ear discharge, ear pain and trouble swallowing.   Eyes: Negative.   Respiratory: Positive for cough. Negative for chest tightness, shortness of breath and wheezing.   Cardiovascular: Negative for chest pain, palpitations and leg swelling.  Gastrointestinal: Negative.       Objective:   Physical Exam  Constitutional: She appears well-developed and well-nourished.  HENT:  Head: Normocephalic and atraumatic.  Oropharynx red with clear drainage, no nasal crusting. TMs clear bilaterally  Eyes: EOM are normal.  Neck: Normal range of motion.  Cardiovascular: Normal rate and regular rhythm.   Pulmonary/Chest: Effort normal and breath sounds normal. No respiratory distress. She has no wheezes. She has no rales.  Abdominal: Soft.  Lymphadenopathy:    She has no cervical adenopathy.   Filed Vitals:   04/19/15 0822  BP: 152/102  Pulse: 95  Temp: 98.6 F (37 C)  TempSrc: Oral  Resp: 14  Height: 5\' 5"  (1.651 m)  Weight: 202 lb (91.627 kg)  SpO2: 98%      Assessment & Plan:

## 2015-04-19 NOTE — Progress Notes (Signed)
Pre visit review using our clinic review tool, if applicable. No additional management support is needed unless otherwise documented below in the visit note. 

## 2015-04-19 NOTE — Assessment & Plan Note (Signed)
No antibiotics needed, rx for flonase and tessalon perles. Does not need work note. Advised about typical course for viral illness and that coughing could last several weeks.

## 2015-04-19 NOTE — Patient Instructions (Signed)
We have sent in flonase (nose spray) use 2 sprays each nostril once a day to help with the sinuses. It is okay to use over the counter medicines but ask the drug store person about ones that are safe for with high blood pressure.   We have also sent in tessalon perles that help with cough. You can use them up to 3 times a day.   Upper Respiratory Infection, Adult Most upper respiratory infections (URIs) are a viral infection of the air passages leading to the lungs. A URI affects the nose, throat, and upper air passages. The most common type of URI is nasopharyngitis and is typically referred to as "the common cold." URIs run their course and usually go away on their own. Most of the time, a URI does not require medical attention, but sometimes a bacterial infection in the upper airways can follow a viral infection. This is called a secondary infection. Sinus and middle ear infections are common types of secondary upper respiratory infections. Bacterial pneumonia can also complicate a URI. A URI can worsen asthma and chronic obstructive pulmonary disease (COPD). Sometimes, these complications can require emergency medical care and may be life threatening.  CAUSES Almost all URIs are caused by viruses. A virus is a type of germ and can spread from one person to another.  RISKS FACTORS You may be at risk for a URI if:   You smoke.   You have chronic heart or lung disease.  You have a weakened defense (immune) system.   You are very young or very old.   You have nasal allergies or asthma.  You work in crowded or poorly ventilated areas.  You work in health care facilities or schools. SIGNS AND SYMPTOMS  Symptoms typically develop 2-3 days after you come in contact with a cold virus. Most viral URIs last 7-10 days. However, viral URIs from the influenza virus (flu virus) can last 14-18 days and are typically more severe. Symptoms may include:   Runny or stuffy (congested) nose.    Sneezing.   Cough.   Sore throat.   Headache.   Fatigue.   Fever.   Loss of appetite.   Pain in your forehead, behind your eyes, and over your cheekbones (sinus pain).  Muscle aches.  DIAGNOSIS  Your health care provider may diagnose a URI by:  Physical exam.  Tests to check that your symptoms are not due to another condition such as:  Strep throat.  Sinusitis.  Pneumonia.  Asthma. TREATMENT  A URI goes away on its own with time. It cannot be cured with medicines, but medicines may be prescribed or recommended to relieve symptoms. Medicines may help:  Reduce your fever.  Reduce your cough.  Relieve nasal congestion. HOME CARE INSTRUCTIONS   Take medicines only as directed by your health care provider.   Gargle warm saltwater or take cough drops to comfort your throat as directed by your health care provider.  Use a warm mist humidifier or inhale steam from a shower to increase air moisture. This may make it easier to breathe.  Drink enough fluid to keep your urine clear or pale yellow.   Eat soups and other clear broths and maintain good nutrition.   Rest as needed.   Return to work when your temperature has returned to normal or as your health care provider advises. You may need to stay home longer to avoid infecting others. You can also use a face mask and careful hand washing  to prevent spread of the virus.  Increase the usage of your inhaler if you have asthma.   Do not use any tobacco products, including cigarettes, chewing tobacco, or electronic cigarettes. If you need help quitting, ask your health care provider. PREVENTION  The best way to protect yourself from getting a cold is to practice good hygiene.   Avoid oral or hand contact with people with cold symptoms.   Wash your hands often if contact occurs.  There is no clear evidence that vitamin C, vitamin E, echinacea, or exercise reduces the chance of developing a cold.  However, it is always recommended to get plenty of rest, exercise, and practice good nutrition.  SEEK MEDICAL CARE IF:   You are getting worse rather than better.   Your symptoms are not controlled by medicine.   You have chills.  You have worsening shortness of breath.  You have brown or red mucus.  You have yellow or brown nasal discharge.  You have pain in your face, especially when you bend forward.  You have a fever.  You have swollen neck glands.  You have pain while swallowing.  You have white areas in the back of your throat. SEEK IMMEDIATE MEDICAL CARE IF:   You have severe or persistent:  Headache.  Ear pain.  Sinus pain.  Chest pain.  You have chronic lung disease and any of the following:  Wheezing.  Prolonged cough.  Coughing up blood.  A change in your usual mucus.  You have a stiff neck.  You have changes in your:  Vision.  Hearing.  Thinking.  Mood. MAKE SURE YOU:   Understand these instructions.  Will watch your condition.  Will get help right away if you are not doing well or get worse.   This information is not intended to replace advice given to you by your health care provider. Make sure you discuss any questions you have with your health care provider.   Document Released: 09/16/2000 Document Revised: 08/07/2014 Document Reviewed: 06/28/2013 Elsevier Interactive Patient Education [employer].

## 2015-05-17 ENCOUNTER — Telehealth: Payer: Self-pay | Admitting: *Deleted

## 2015-05-17 MED ORDER — AMLODIPINE BESYLATE 5 MG PO TABS
5.0000 mg | ORAL_TABLET | Freq: Every day | ORAL | Status: DC
Start: 2015-05-17 — End: 2015-05-20

## 2015-05-17 MED ORDER — IRBESARTAN 300 MG PO TABS: 300.0000 mg | ORAL_TABLET | Freq: Every day | ORAL | Status: DC

## 2015-05-17 NOTE — Telephone Encounter (Signed)
Left msg on triage stating she is out of her BP meds. Have appt for mon w/Dr. Ronnald Ramp want to get a refill call into [facility]. Called pt no answer LMOM will send 30 day unto pharmacy until appt...Stacey Ruiz

## 2015-05-20 ENCOUNTER — Encounter: Payer: Self-pay | Admitting: Internal Medicine

## 2015-05-20 ENCOUNTER — Other Ambulatory Visit (INDEPENDENT_AMBULATORY_CARE_PROVIDER_SITE_OTHER): Payer: BLUE CROSS/BLUE SHIELD

## 2015-05-20 ENCOUNTER — Ambulatory Visit (INDEPENDENT_AMBULATORY_CARE_PROVIDER_SITE_OTHER): Payer: BLUE CROSS/BLUE SHIELD | Admitting: Internal Medicine

## 2015-05-20 VITALS — BP 138/88 | HR 64 | Temp 98.4°F | Resp 16 | Ht 65.0 in | Wt 211.0 lb

## 2015-05-20 DIAGNOSIS — I10 Essential (primary) hypertension: Secondary | ICD-10-CM

## 2015-05-20 DIAGNOSIS — F40243 Fear of flying: Secondary | ICD-10-CM | POA: Diagnosis not present

## 2015-05-20 DIAGNOSIS — E669 Obesity, unspecified: Secondary | ICD-10-CM | POA: Diagnosis not present

## 2015-05-20 DIAGNOSIS — R7309 Other abnormal glucose: Secondary | ICD-10-CM | POA: Diagnosis not present

## 2015-05-20 DIAGNOSIS — Z1231 Encounter for screening mammogram for malignant neoplasm of breast: Secondary | ICD-10-CM | POA: Insufficient documentation

## 2015-05-20 DIAGNOSIS — Z0000 Encounter for general adult medical examination without abnormal findings: Secondary | ICD-10-CM

## 2015-05-20 LAB — CBC WITH DIFFERENTIAL/PLATELET
BASOS ABS: 0 10*3/uL (ref 0.0–0.1)
BASOS PCT: 0.3 % (ref 0.0–3.0)
EOS ABS: 0.2 10*3/uL (ref 0.0–0.7)
Eosinophils Relative: 3.5 % (ref 0.0–5.0)
HEMATOCRIT: 39.6 % (ref 36.0–46.0)
Hemoglobin: 13.4 g/dL (ref 12.0–15.0)
LYMPHS ABS: 2.6 10*3/uL (ref 0.7–4.0)
LYMPHS PCT: 38.2 % (ref 12.0–46.0)
MCHC: 33.7 g/dL (ref 30.0–36.0)
MCV: 87.1 fl (ref 78.0–100.0)
Monocytes Absolute: 0.6 10*3/uL (ref 0.1–1.0)
Monocytes Relative: 9.1 % (ref 3.0–12.0)
NEUTROS ABS: 3.3 10*3/uL (ref 1.4–7.7)
NEUTROS PCT: 48.9 % (ref 43.0–77.0)
PLATELETS: 261 10*3/uL (ref 150.0–400.0)
RBC: 4.55 Mil/uL (ref 3.87–5.11)
RDW: 12.6 % (ref 11.5–15.5)
WBC: 6.8 10*3/uL (ref 4.0–10.5)

## 2015-05-20 LAB — TSH: TSH: 0.61 u[IU]/mL (ref 0.35–4.50)

## 2015-05-20 LAB — COMPREHENSIVE METABOLIC PANEL
ALT: 12 U/L (ref 0–35)
AST: 13 U/L (ref 0–37)
Albumin: 4.1 g/dL (ref 3.5–5.2)
Alkaline Phosphatase: 57 U/L (ref 39–117)
BILIRUBIN TOTAL: 0.3 mg/dL (ref 0.2–1.2)
BUN: 8 mg/dL (ref 6–23)
CHLORIDE: 104 meq/L (ref 96–112)
CO2: 28 meq/L (ref 19–32)
CREATININE: 0.75 mg/dL (ref 0.40–1.20)
Calcium: 9.3 mg/dL (ref 8.4–10.5)
GFR: 109.51 mL/min (ref >60.00)
GLUCOSE: 99 mg/dL (ref 70–99)
Potassium: 4.1 mEq/L (ref 3.5–5.1)
SODIUM: 139 meq/L (ref 135–145)
Total Protein: 7.3 g/dL (ref 6.0–8.3)

## 2015-05-20 LAB — LIPID PANEL
CHOL/HDL RATIO: 4
Cholesterol: 140 mg/dL (ref 0–200)
HDL: 37.8 mg/dL — ABNORMAL LOW (ref >39.00)
LDL CALC: 77 mg/dL (ref 0–99)
NONHDL: 102.02
Triglycerides: 126 mg/dL (ref 0.0–149.0)
VLDL: 25.2 mg/dL (ref 0.0–40.0)

## 2015-05-20 LAB — HEMOGLOBIN A1C: HEMOGLOBIN A1C: 5.6 % (ref 4.6–6.5)

## 2015-05-20 MED ORDER — IRBESARTAN 300 MG PO TABS: 300.0000 mg | ORAL_TABLET | Freq: Every day | ORAL | Status: DC

## 2015-05-20 MED ORDER — PHENTERMINE HCL 37.5 MG PO CAPS: 37.5000 mg | ORAL_CAPSULE | ORAL | Status: DC

## 2015-05-20 MED ORDER — DIAZEPAM 5 MG PO TABS: 5.0000 mg | ORAL_TABLET | Freq: Two times a day (BID) | ORAL | Status: DC | PRN

## 2015-05-20 MED ORDER — AMLODIPINE BESYLATE 5 MG PO TABS: 5.0000 mg | ORAL_TABLET | Freq: Every day | ORAL | Status: DC

## 2015-05-20 NOTE — Progress Notes (Signed)
Subjective:  Patient ID: Stacey Ruiz, female    DOB: Sep 12, 1975  Age: 40 y.o. MRN: [id number]  CC: Hypertension and Annual Exam   HPI [name] presents for a CPX and f/up on HTN and help with obesity/weight gain, she wants to try phentermine again. Her BP has been well controlled on amlodipine and irbesartan.  Outpatient Prescriptions Prior to Visit  Medication Sig Dispense Refill  . amLODipine (NORVASC) 5 MG tablet Take 1 tablet (5 mg total) by mouth daily. Must keep appt for future refills 30 tablet 0  . irbesartan (AVAPRO) 300 MG tablet Take 1 tablet (300 mg total) by mouth daily. Must keep appt for future refills 30 tablet 0  . meloxicam (MOBIC) 7.5 MG tablet Reported on 05/20/2015  0  . amLODipine (NORVASC) 5 MG tablet take 1 tablet by mouth once daily (Patient not taking: Reported on 04/19/2015) 90 tablet 3  . benzonatate (TESSALON) 200 MG capsule Take 1 capsule (200 mg total) by mouth 3 (three) times daily as needed for cough. 60 capsule 0  . fluticasone (FLONASE) 50 MCG/ACT nasal spray Place 2 sprays into both nostrils daily. 16 g 6  . irbesartan (AVAPRO) 300 MG tablet Take 1 tablet (300 mg total) by mouth daily. (Patient not taking: Reported on 04/19/2015) 90 tablet 3  . metroNIDAZOLE (FLAGYL) 500 MG tablet Take 1 tablet (500 mg total) by mouth 2 (two) times daily. (Patient not taking: Reported on 04/19/2015) 14 tablet 2  . phentermine 37.5 MG capsule Take 1 capsule (37.5 mg total) by mouth every morning. (Patient not taking: Reported on 05/20/2015) 30 capsule 2   No facility-administered medications prior to visit.    ROS Review of Systems  Constitutional: Positive for unexpected weight change (wt gain). Negative for fever, chills, diaphoresis, appetite change and fatigue.  HENT: Negative.   Eyes: Negative.   Respiratory: Negative.  Negative for cough, choking, chest tightness and shortness of breath.   Cardiovascular: Negative.  Negative for chest pain,  palpitations and leg swelling.  Gastrointestinal: Negative.  Negative for nausea, vomiting, abdominal pain, diarrhea and constipation.  Endocrine: Negative.   Genitourinary: Negative.   Musculoskeletal: Negative.  Negative for myalgias, back pain, joint swelling and arthralgias.  Skin: Negative.  Negative for color change and rash.  Allergic/Immunologic: Negative.   Neurological: Negative.  Negative for dizziness.  Hematological: Negative.  Negative for adenopathy. Does not bruise/bleed easily.  Psychiatric/Behavioral: Negative for suicidal ideas, confusion, sleep disturbance, dysphoric mood, decreased concentration and agitation. The patient is nervous/anxious.     Objective:  BP 138/88 mmHg  Pulse 64  Temp(Src) 98.4 F (36.9 C) (Oral)  Resp 16  Ht 5\' 5"  (1.651 m)  Wt 211 lb (95.709 kg)  BMI 35.11 kg/m2  SpO2 96%  LMP 04/21/2015  BP Readings from Last 3 Encounters:  05/20/15 138/88  04/19/15 170/120  06/20/14 122/80    Wt Readings from Last 3 Encounters:  05/20/15 211 lb (95.709 kg)  04/19/15 202 lb (91.627 kg)  06/20/14 195 lb (88.451 kg)    Physical Exam  Constitutional: She is oriented to person, place, and time. She appears well-developed and well-nourished. No distress.  HENT:  Mouth/Throat: Oropharynx is clear and moist. No oropharyngeal exudate.  Eyes: Conjunctivae are normal. Right eye exhibits no discharge. Left eye exhibits no discharge. No scleral icterus.  Neck: Normal range of motion. Neck supple. No JVD present. No tracheal deviation present. No thyromegaly present.  Cardiovascular: Normal rate, regular rhythm, normal heart sounds and  intact distal pulses.  Exam reveals no gallop and no friction rub.   No murmur heard. Pulmonary/Chest: Effort normal and breath sounds normal. No stridor. No respiratory distress. She has no wheezes. She has no rales. She exhibits no tenderness.  Abdominal: Soft. Bowel sounds are normal. She exhibits no distension and no mass.  There is no tenderness. There is no rebound and no guarding.  Musculoskeletal: Normal range of motion. She exhibits no edema or tenderness.  Lymphadenopathy:    She has no cervical adenopathy.  Neurological: She is oriented to person, place, and time.  Skin: Skin is warm and dry. No rash noted. She is not diaphoretic. No erythema. No pallor.  Psychiatric: She has a normal mood and affect. Her behavior is normal. Judgment and thought content normal.  She requests a refill on valium for fear of flying  Vitals reviewed.   Lab Results  Component Value Date   WBC 6.8 05/20/2015   HGB 13.4 05/20/2015   HCT 39.6 05/20/2015   PLT 261.0 05/20/2015   GLUCOSE 99 05/20/2015   CHOL 140 05/20/2015   TRIG 126.0 05/20/2015   HDL 37.80* 05/20/2015   LDLCALC 77 05/20/2015   ALT 12 05/20/2015   AST 13 05/20/2015   NA 139 05/20/2015   K 4.1 05/20/2015   CL 104 05/20/2015   CREATININE 0.75 05/20/2015   BUN 8 05/20/2015   CO2 28 05/20/2015   TSH 0.61 05/20/2015   INR 0.9 05/20/2011   HGBA1C 5.6 05/20/2015    Dg Chest 2 View  04/18/2014  CLINICAL DATA:  Cough and chills. EXAM: CHEST  2 VIEW COMPARISON:  09/15/2013. FINDINGS: The heart size and mediastinal contours are within normal limits. Both lungs are clear. The visualized skeletal structures are unremarkable. IMPRESSION: No acute cardiopulmonary disease. Previously identified atelectatic changes in the right mid lung field has cleared. No focal infiltrate. Electronically Signed   By: Marcello Moores  Register   On: 04/18/2014 09:34    Assessment & Plan:   Kollins was seen today for hypertension and annual exam.  Diagnoses and all orders for this visit:  Obesity (BMI 30.0-34.9)- she will cont lifestyle modifications, will restart phentermine -     phentermine 37.5 MG capsule; Take 1 capsule (37.5 mg total) by mouth every morning.  Routine general medical examination at a health care facility- she refused a flu vax today, exam done, PAP is UTD,  mammogram ordered, labs ordered and reviewed, pt ed material was given -     Lipid panel; Future -     Comprehensive metabolic panel; Future -     CBC with Differential/Platelet; Future -     TSH; Future  Other abnormal glucose- she is prediabetic, no meds are needed at this time -     Hemoglobin A1c; Future  Essential hypertension, benign- her BP is well controlled, lytes and renal function are stable -     irbesartan (AVAPRO) 300 MG tablet; Take 1 tablet (300 mg total) by mouth daily. -     amLODipine (NORVASC) 5 MG tablet; Take 1 tablet (5 mg total) by mouth daily.  Visit for screening mammogram -     Cancel: MM DIGITAL SCREENING BILATERAL; Future -     MM SCREENING BREAST TOMO BILATERAL; Future  Fear of flying -     diazepam (VALIUM) 5 MG tablet; Take 1 tablet (5 mg total) by mouth every 12 (twelve) hours as needed for anxiety.   I have discontinued Ms. Takeshita's metroNIDAZOLE, fluticasone, and benzonatate.  I have also changed her irbesartan and amLODipine. Additionally, I am having her start on diazepam. Lastly, I am having her maintain her meloxicam and phentermine.  Meds ordered this encounter  Medications  . irbesartan (AVAPRO) 300 MG tablet    Sig: Take 1 tablet (300 mg total) by mouth daily.    Dispense:  90 tablet    Refill:  3  . amLODipine (NORVASC) 5 MG tablet    Sig: Take 1 tablet (5 mg total) by mouth daily.    Dispense:  90 tablet    Refill:  3  . phentermine 37.5 MG capsule    Sig: Take 1 capsule (37.5 mg total) by mouth every morning.    Dispense:  30 capsule    Refill:  2  . diazepam (VALIUM) 5 MG tablet    Sig: Take 1 tablet (5 mg total) by mouth every 12 (twelve) hours as needed for anxiety.    Dispense:  25 tablet    Refill:  1     Follow-up: Return in about 4 months (around 09/17/2015).  Scarlette Calico, MD

## 2015-05-20 NOTE — Patient Instructions (Signed)
Preventive Care for Adults, Female A healthy lifestyle and preventive care can promote health and wellness. Preventive health guidelines for women include the following key practices.  A routine yearly physical is a good way to check with your health care provider about your health and preventive screening. It is a chance to share any concerns and updates on your health and to receive a thorough exam.  Visit your dentist for a routine exam and preventive care every 6 months. Brush your teeth twice a day and floss once a day. Good oral hygiene prevents tooth decay and gum disease.  The frequency of eye exams is based on your age, health, family medical history, use of contact lenses, and other factors. Follow your health care provider's recommendations for frequency of eye exams.  Eat a healthy diet. Foods like vegetables, fruits, whole grains, low-fat dairy products, and lean protein foods contain the nutrients you need without too many calories. Decrease your intake of foods high in solid fats, added sugars, and salt. Eat the right amount of calories for you.Get information about a proper diet from your health care provider, if necessary.  Regular physical exercise is one of the most important things you can do for your health. Most adults should get at least 150 minutes of moderate-intensity exercise (any activity that increases your heart rate and causes you to sweat) each week. In addition, most adults need muscle-strengthening exercises on 2 or more days a week.  Maintain a healthy weight. The body mass index (BMI) is a screening tool to identify possible weight problems. It provides an estimate of body fat based on height and weight. Your health care provider can find your BMI and can help you achieve or maintain a healthy weight.For adults 20 years and older:  A BMI below 18.5 is considered underweight.  A BMI of 18.5 to 24.9 is normal.  A BMI of 25 to 29.9 is considered overweight.  A  BMI of 30 and above is considered obese.  Maintain normal blood lipids and cholesterol levels by exercising and minimizing your intake of saturated fat. Eat a balanced diet with plenty of fruit and vegetables. Blood tests for lipids and cholesterol should begin at age 45 and be repeated every 5 years. If your lipid or cholesterol levels are high, you are over 50, or you are at high risk for heart disease, you may need your cholesterol levels checked more frequently.Ongoing high lipid and cholesterol levels should be treated with medicines if diet and exercise are not working.  If you smoke, find out from your health care provider how to quit. If you do not use tobacco, do not start.  Lung cancer screening is recommended for adults aged 45-80 years who are at high risk for developing lung cancer because of a history of smoking. A yearly low-dose CT scan of the lungs is recommended for people who have at least a 30-pack-year history of smoking and are a current smoker or have quit within the past 15 years. A pack year of smoking is smoking an average of 1 pack of cigarettes a day for 1 year (for example: 1 pack a day for 30 years or 2 packs a day for 15 years). Yearly screening should continue until the smoker has stopped smoking for at least 15 years. Yearly screening should be stopped for people who develop a health problem that would prevent them from having lung cancer treatment.  If you are pregnant, do not drink alcohol. If you are  breastfeeding, be very cautious about drinking alcohol. If you are not pregnant and choose to drink alcohol, do not have more than 1 drink per day. One drink is considered to be 12 ounces (355 mL) of beer, 5 ounces (148 mL) of wine, or 1.5 ounces (44 mL) of liquor.  Avoid use of street drugs. Do not share needles with anyone. Ask for help if you need support or instructions about stopping the use of drugs.  High blood pressure causes heart disease and increases the risk  of stroke. Your blood pressure should be checked at least every 1 to 2 years. Ongoing high blood pressure should be treated with medicines if weight loss and exercise do not work.  If you are 55-79 years old, ask your health care provider if you should take aspirin to prevent strokes.  Diabetes screening is done by taking a blood sample to check your blood glucose level after you have not eaten for a certain period of time (fasting). If you are not overweight and you do not have risk factors for diabetes, you should be screened once every 3 years starting at age 45. If you are overweight or obese and you are 40-70 years of age, you should be screened for diabetes every year as part of your cardiovascular risk assessment.  Breast cancer screening is essential preventive care for women. You should practice "breast self-awareness." This means understanding the normal appearance and feel of your breasts and may include breast self-examination. Any changes detected, no matter how small, should be reported to a health care provider. Women in their 20s and 30s should have a clinical breast exam (CBE) by a health care provider as part of a regular health exam every 1 to 3 years. After age 40, women should have a CBE every year. Starting at age 40, women should consider having a mammogram (breast X-ray test) every year. Women who have a family history of breast cancer should talk to their health care provider about genetic screening. Women at a high risk of breast cancer should talk to their health care providers about having an MRI and a mammogram every year.  Breast cancer gene (BRCA)-related cancer risk assessment is recommended for women who have family members with BRCA-related cancers. BRCA-related cancers include breast, ovarian, tubal, and peritoneal cancers. Having family members with these cancers may be associated with an increased risk for harmful changes (mutations) in the breast cancer genes BRCA1 and  BRCA2. Results of the assessment will determine the need for genetic counseling and BRCA1 and BRCA2 testing.  Your health care provider may recommend that you be screened regularly for cancer of the pelvic organs (ovaries, uterus, and vagina). This screening involves a pelvic examination, including checking for microscopic changes to the surface of your cervix (Pap test). You may be encouraged to have this screening done every 3 years, beginning at age 21.  For women ages 30-65, health care providers may recommend pelvic exams and Pap testing every 3 years, or they may recommend the Pap and pelvic exam, combined with testing for human papilloma virus (HPV), every 5 years. Some types of HPV increase your risk of cervical cancer. Testing for HPV may also be done on women of any age with unclear Pap test results.  Other health care providers may not recommend any screening for nonpregnant women who are considered low risk for pelvic cancer and who do not have symptoms. Ask your health care provider if a screening pelvic exam is right for   you.  If you have had past treatment for cervical cancer or a condition that could lead to cancer, you need Pap tests and screening for cancer for at least 20 years after your treatment. If Pap tests have been discontinued, your risk factors (such as having a new sexual partner) need to be reassessed to determine if screening should resume. Some women have medical problems that increase the chance of getting cervical cancer. In these cases, your health care provider may recommend more frequent screening and Pap tests.  Colorectal cancer can be detected and often prevented. Most routine colorectal cancer screening begins at the age of 50 years and continues through age 75 years. However, your health care provider may recommend screening at an earlier age if you have risk factors for colon cancer. On a yearly basis, your health care provider may provide home test kits to check  for hidden blood in the stool. Use of a small camera at the end of a tube, to directly examine the colon (sigmoidoscopy or colonoscopy), can detect the earliest forms of colorectal cancer. Talk to your health care provider about this at age 50, when routine screening begins. Direct exam of the colon should be repeated every 5-10 years through age 75 years, unless early forms of precancerous polyps or small growths are found.  People who are at an increased risk for hepatitis B should be screened for this virus. You are considered at high risk for hepatitis B if:  You were born in a country where hepatitis B occurs often. Talk with your health care provider about which countries are considered high risk.  Your parents were born in a high-risk country and you have not received a shot to protect against hepatitis B (hepatitis B vaccine).  You have HIV or AIDS.  You use needles to inject street drugs.  You live with, or have sex with, someone who has hepatitis B.  You get hemodialysis treatment.  You take certain medicines for conditions like cancer, organ transplantation, and autoimmune conditions.  Hepatitis C blood testing is recommended for all people born from 1945 through 1965 and any individual with known risks for hepatitis C.  Practice safe sex. Use condoms and avoid high-risk sexual practices to reduce the spread of sexually transmitted infections (STIs). STIs include gonorrhea, chlamydia, syphilis, trichomonas, herpes, HPV, and human immunodeficiency virus (HIV). Herpes, HIV, and HPV are viral illnesses that have no cure. They can result in disability, cancer, and death.  You should be screened for sexually transmitted illnesses (STIs) including gonorrhea and chlamydia if:  You are sexually active and are younger than 24 years.  You are older than 24 years and your health care provider tells you that you are at risk for this type of infection.  Your sexual activity has changed  since you were last screened and you are at an increased risk for chlamydia or gonorrhea. Ask your health care provider if you are at risk.  If you are at risk of being infected with HIV, it is recommended that you take a prescription medicine daily to prevent HIV infection. This is called preexposure prophylaxis (PrEP). You are considered at risk if:  You are sexually active and do not regularly use condoms or know the HIV status of your partner(s).  You take drugs by injection.  You are sexually active with a partner who has HIV.  Talk with your health care provider about whether you are at high risk of being infected with HIV. If   you choose to begin PrEP, you should first be tested for HIV. You should then be tested every 3 months for as long as you are taking PrEP.  Osteoporosis is a disease in which the bones lose minerals and strength with aging. This can result in serious bone fractures or breaks. The risk of osteoporosis can be identified using a bone density scan. Women ages 67 years and over and women at risk for fractures or osteoporosis should discuss screening with their health care providers. Ask your health care provider whether you should take a calcium supplement or vitamin D to reduce the rate of osteoporosis.  Menopause can be associated with physical symptoms and risks. Hormone replacement therapy is available to decrease symptoms and risks. You should talk to your health care provider about whether hormone replacement therapy is right for you.  Use sunscreen. Apply sunscreen liberally and repeatedly throughout the day. You should seek shade when your shadow is shorter than you. Protect yourself by wearing long sleeves, pants, a wide-brimmed hat, and sunglasses year round, whenever you are outdoors.  Once a month, do a whole body skin exam, using a mirror to look at the skin on your back. Tell your health care provider of new moles, moles that have irregular borders, moles that  are larger than a pencil eraser, or moles that have changed in shape or color.  Stay current with required vaccines (immunizations).  Influenza vaccine. All adults should be immunized every year.  Tetanus, diphtheria, and acellular pertussis (Td, Tdap) vaccine. Pregnant women should receive 1 dose of Tdap vaccine during each pregnancy. The dose should be obtained regardless of the length of time since the last dose. Immunization is preferred during the 27th-36th week of gestation. An adult who has not previously received Tdap or who does not know her vaccine status should receive 1 dose of Tdap. This initial dose should be followed by tetanus and diphtheria toxoids (Td) booster doses every 10 years. Adults with an unknown or incomplete history of completing a 3-dose immunization series with Td-containing vaccines should begin or complete a primary immunization series including a Tdap dose. Adults should receive a Td booster every 10 years.  Varicella vaccine. An adult without evidence of immunity to varicella should receive 2 doses or a second dose if she has previously received 1 dose. Pregnant females who do not have evidence of immunity should receive the first dose after pregnancy. This first dose should be obtained before leaving the health care facility. The second dose should be obtained 4-8 weeks after the first dose.  Human papillomavirus (HPV) vaccine. Females aged 13-26 years who have not received the vaccine previously should obtain the 3-dose series. The vaccine is not recommended for use in pregnant females. However, pregnancy testing is not needed before receiving a dose. If a female is found to be pregnant after receiving a dose, no treatment is needed. In that case, the remaining doses should be delayed until after the pregnancy. Immunization is recommended for any person with an immunocompromised condition through the age of 61 years if she did not get any or all doses earlier. During the  3-dose series, the second dose should be obtained 4-8 weeks after the first dose. The third dose should be obtained 24 weeks after the first dose and 16 weeks after the second dose.  Zoster vaccine. One dose is recommended for adults aged 30 years or older unless certain conditions are present.  Measles, mumps, and rubella (MMR) vaccine. Adults born  before 1957 generally are considered immune to measles and mumps. Adults born in 1957 or later should have 1 or more doses of MMR vaccine unless there is a contraindication to the vaccine or there is laboratory evidence of immunity to each of the three diseases. A routine second dose of MMR vaccine should be obtained at least 28 days after the first dose for students attending postsecondary schools, health care workers, or international travelers. People who received inactivated measles vaccine or an unknown type of measles vaccine during 1963-1967 should receive 2 doses of MMR vaccine. People who received inactivated mumps vaccine or an unknown type of mumps vaccine before 1979 and are at high risk for mumps infection should consider immunization with 2 doses of MMR vaccine. For females of childbearing age, rubella immunity should be determined. If there is no evidence of immunity, females who are not pregnant should be vaccinated. If there is no evidence of immunity, females who are pregnant should delay immunization until after pregnancy. Unvaccinated health care workers born before 1957 who lack laboratory evidence of measles, mumps, or rubella immunity or laboratory confirmation of disease should consider measles and mumps immunization with 2 doses of MMR vaccine or rubella immunization with 1 dose of MMR vaccine.  Pneumococcal 13-valent conjugate (PCV13) vaccine. When indicated, a person who is uncertain of his immunization history and has no record of immunization should receive the PCV13 vaccine. All adults 65 years of age and older should receive this  vaccine. An adult aged 19 years or older who has certain medical conditions and has not been previously immunized should receive 1 dose of PCV13 vaccine. This PCV13 should be followed with a dose of pneumococcal polysaccharide (PPSV23) vaccine. Adults who are at high risk for pneumococcal disease should obtain the PPSV23 vaccine at least 8 weeks after the dose of PCV13 vaccine. Adults older than 41 years of age who have normal immune system function should obtain the PPSV23 vaccine dose at least 1 year after the dose of PCV13 vaccine.  Pneumococcal polysaccharide (PPSV23) vaccine. When PCV13 is also indicated, PCV13 should be obtained first. All adults aged 65 years and older should be immunized. An adult younger than age 65 years who has certain medical conditions should be immunized. Any person who resides in a nursing home or long-term care facility should be immunized. An adult smoker should be immunized. People with an immunocompromised condition and certain other conditions should receive both PCV13 and PPSV23 vaccines. People with human immunodeficiency virus (HIV) infection should be immunized as soon as possible after diagnosis. Immunization during chemotherapy or radiation therapy should be avoided. Routine use of PPSV23 vaccine is not recommended for American Indians, [place] Natives, or people younger than 65 years unless there are medical conditions that require PPSV23 vaccine. When indicated, people who have unknown immunization and have no record of immunization should receive PPSV23 vaccine. One-time revaccination 5 years after the first dose of PPSV23 is recommended for people aged 19-64 years who have chronic kidney failure, nephrotic syndrome, asplenia, or immunocompromised conditions. People who received 1-2 doses of PPSV23 before age 65 years should receive another dose of PPSV23 vaccine at age 65 years or later if at least 5 years have passed since the previous dose. Doses of PPSV23 are not  needed for people immunized with PPSV23 at or after age 65 years.  Meningococcal vaccine. Adults with asplenia or persistent complement component deficiencies should receive 2 doses of quadrivalent meningococcal conjugate (MenACWY-D) vaccine. The doses should be obtained   at least 2 months apart. Microbiologists working with certain meningococcal bacteria, [place] recruits, people at risk during an outbreak, and people who travel to or live in countries with a high rate of meningitis should be immunized. A first-year college student up through age 34 years who is living in a residence hall should receive a dose if she did not receive a dose on or after her 16th birthday. Adults who have certain high-risk conditions should receive one or more doses of vaccine.  Hepatitis A vaccine. Adults who wish to be protected from this disease, have certain high-risk conditions, work with hepatitis A-infected animals, work in hepatitis A research labs, or travel to or work in countries with a high rate of hepatitis A should be immunized. Adults who were previously unvaccinated and who anticipate close contact with an international adoptee during the first 60 days after arrival in the [place] from a country with a high rate of hepatitis A should be immunized.  Hepatitis B vaccine. Adults who wish to be protected from this disease, have certain high-risk conditions, may be exposed to blood or other infectious body fluids, are household contacts or sex partners of hepatitis B positive people, are clients or workers in certain care facilities, or travel to or work in countries with a high rate of hepatitis B should be immunized.  Haemophilus influenzae type b (Hib) vaccine. A previously unvaccinated person with asplenia or sickle cell disease or having a scheduled splenectomy should receive 1 dose of Hib vaccine. Regardless of previous immunization, a recipient of a hematopoietic stem cell transplant should receive a  3-dose series 6-12 months after her successful transplant. Hib vaccine is not recommended for adults with HIV infection. Preventive Services / Frequency Ages 35 to 4 years  Blood pressure check.** / Every 3-5 years.  Lipid and cholesterol check.** / Every 5 years beginning at age 60.  Clinical breast exam.** / Every 3 years for women in their 71s and 10s.  BRCA-related cancer risk assessment.** / For women who have family members with a BRCA-related cancer (breast, ovarian, tubal, or peritoneal cancers).  Pap test.** / Every 2 years from ages 76 through 26. Every 3 years starting at age 61 through age 76 or 93 with a history of 3 consecutive normal Pap tests.  HPV screening.** / Every 3 years from ages 37 through ages 60 to 51 with a history of 3 consecutive normal Pap tests.  Hepatitis C blood test.** / For any individual with known risks for hepatitis C.  Skin self-exam. / Monthly.  Influenza vaccine. / Every year.  Tetanus, diphtheria, and acellular pertussis (Tdap, Td) vaccine.** / Consult your health care provider. Pregnant women should receive 1 dose of Tdap vaccine during each pregnancy. 1 dose of Td every 10 years.  Varicella vaccine.** / Consult your health care provider. Pregnant females who do not have evidence of immunity should receive the first dose after pregnancy.  HPV vaccine. / 3 doses over 6 months, if 93 and younger. The vaccine is not recommended for use in pregnant females. However, pregnancy testing is not needed before receiving a dose.  Measles, mumps, rubella (MMR) vaccine.** / You need at least 1 dose of MMR if you were born in 1957 or later. You may also need a 2nd dose. For females of childbearing age, rubella immunity should be determined. If there is no evidence of immunity, females who are not pregnant should be vaccinated. If there is no evidence of immunity, females who are  pregnant should delay immunization until after pregnancy.  Pneumococcal  13-valent conjugate (PCV13) vaccine.** / Consult your health care provider.  Pneumococcal polysaccharide (PPSV23) vaccine.** / 1 to 2 doses if you smoke cigarettes or if you have certain conditions.  Meningococcal vaccine.** / 1 dose if you are age 68 to 8 years and a Market researcher living in a residence hall, or have one of several medical conditions, you need to get vaccinated against meningococcal disease. You may also need additional booster doses.  Hepatitis A vaccine.** / Consult your health care provider.  Hepatitis B vaccine.** / Consult your health care provider.  Haemophilus influenzae type b (Hib) vaccine.** / Consult your health care provider. Ages 7 to 53 years  Blood pressure check.** / Every year.  Lipid and cholesterol check.** / Every 5 years beginning at age 25 years.  Lung cancer screening. / Every year if you are aged 11-80 years and have a 30-pack-year history of smoking and currently smoke or have quit within the past 15 years. Yearly screening is stopped once you have quit smoking for at least 15 years or develop a health problem that would prevent you from having lung cancer treatment.  Clinical breast exam.** / Every year after age 48 years.  BRCA-related cancer risk assessment.** / For women who have family members with a BRCA-related cancer (breast, ovarian, tubal, or peritoneal cancers).  Mammogram.** / Every year beginning at age 41 years and continuing for as long as you are in good health. Consult with your health care provider.  Pap test.** / Every 3 years starting at age 65 years through age 37 or 70 years with a history of 3 consecutive normal Pap tests.  HPV screening.** / Every 3 years from ages 72 years through ages 60 to 40 years with a history of 3 consecutive normal Pap tests.  Fecal occult blood test (FOBT) of stool. / Every year beginning at age 21 years and continuing until age 5 years. You may not need to do this test if you get  a colonoscopy every 10 years.  Flexible sigmoidoscopy or colonoscopy.** / Every 5 years for a flexible sigmoidoscopy or every 10 years for a colonoscopy beginning at age 35 years and continuing until age 48 years.  Hepatitis C blood test.** / For all people born from [birth year] through 1965 and any individual with known risks for hepatitis C.  Skin self-exam. / Monthly.  Influenza vaccine. / Every year.  Tetanus, diphtheria, and acellular pertussis (Tdap/Td) vaccine.** / Consult your health care provider. Pregnant women should receive 1 dose of Tdap vaccine during each pregnancy. 1 dose of Td every 10 years.  Varicella vaccine.** / Consult your health care provider. Pregnant females who do not have evidence of immunity should receive the first dose after pregnancy.  Zoster vaccine.** / 1 dose for adults aged 30 years or older.  Measles, mumps, rubella (MMR) vaccine.** / You need at least 1 dose of MMR if you were born in 1957 or later. You may also need a second dose. For females of childbearing age, rubella immunity should be determined. If there is no evidence of immunity, females who are not pregnant should be vaccinated. If there is no evidence of immunity, females who are pregnant should delay immunization until after pregnancy.  Pneumococcal 13-valent conjugate (PCV13) vaccine.** / Consult your health care provider.  Pneumococcal polysaccharide (PPSV23) vaccine.** / 1 to 2 doses if you smoke cigarettes or if you have certain conditions.  Meningococcal vaccine.** /  Consult your health care provider.  Hepatitis A vaccine.** / Consult your health care provider.  Hepatitis B vaccine.** / Consult your health care provider.  Haemophilus influenzae type b (Hib) vaccine.** / Consult your health care provider. Ages 64 years and over  Blood pressure check.** / Every year.  Lipid and cholesterol check.** / Every 5 years beginning at age 23 years.  Lung cancer screening. / Every year if you  are aged 16-80 years and have a 30-pack-year history of smoking and currently smoke or have quit within the past 15 years. Yearly screening is stopped once you have quit smoking for at least 15 years or develop a health problem that would prevent you from having lung cancer treatment.  Clinical breast exam.** / Every year after age 74 years.  BRCA-related cancer risk assessment.** / For women who have family members with a BRCA-related cancer (breast, ovarian, tubal, or peritoneal cancers).  Mammogram.** / Every year beginning at age 44 years and continuing for as long as you are in good health. Consult with your health care provider.  Pap test.** / Every 3 years starting at age 58 years through age 22 or 39 years with 3 consecutive normal Pap tests. Testing can be stopped between 65 and 70 years with 3 consecutive normal Pap tests and no abnormal Pap or HPV tests in the past 10 years.  HPV screening.** / Every 3 years from ages 64 years through ages 70 or 61 years with a history of 3 consecutive normal Pap tests. Testing can be stopped between 65 and 70 years with 3 consecutive normal Pap tests and no abnormal Pap or HPV tests in the past 10 years.  Fecal occult blood test (FOBT) of stool. / Every year beginning at age 40 years and continuing until age 27 years. You may not need to do this test if you get a colonoscopy every 10 years.  Flexible sigmoidoscopy or colonoscopy.** / Every 5 years for a flexible sigmoidoscopy or every 10 years for a colonoscopy beginning at age 7 years and continuing until age 32 years.  Hepatitis C blood test.** / For all people born from [birth year] through 1966 and any individual with known risks for hepatitis C.  Osteoporosis screening.** / A one-time screening for women ages 30 years and over and women at risk for fractures or osteoporosis.  Skin self-exam. / Monthly.  Influenza vaccine. / Every year.  Tetanus, diphtheria, and acellular pertussis (Tdap/Td)  vaccine.** / 1 dose of Td every 10 years.  Varicella vaccine.** / Consult your health care provider.  Zoster vaccine.** / 1 dose for adults aged 35 years or older.  Pneumococcal 13-valent conjugate (PCV13) vaccine.** / Consult your health care provider.  Pneumococcal polysaccharide (PPSV23) vaccine.** / 1 dose for all adults aged 46 years and older.  Meningococcal vaccine.** / Consult your health care provider.  Hepatitis A vaccine.** / Consult your health care provider.  Hepatitis B vaccine.** / Consult your health care provider.  Haemophilus influenzae type b (Hib) vaccine.** / Consult your health care provider. ** Family history and personal history of risk and conditions may change your health care provider's recommendations.   This information is not intended to replace advice given to you by your health care provider. Make sure you discuss any questions you have with your health care provider.   Document Released: 05/19/2001 Document Revised: 04/13/2014 Document Reviewed: 08/18/2010 Elsevier Interactive Patient Education [employer].

## 2015-05-20 NOTE — Progress Notes (Signed)
Pre visit review using our clinic review tool, if applicable. No additional management support is needed unless otherwise documented below in the visit note. 

## 2015-06-04 ENCOUNTER — Telehealth: Payer: Self-pay | Admitting: *Deleted

## 2015-06-04 DIAGNOSIS — N760 Acute vaginitis: Principal | ICD-10-CM

## 2015-06-04 DIAGNOSIS — B9689 Other specified bacterial agents as the cause of diseases classified elsewhere: Secondary | ICD-10-CM

## 2015-06-04 MED ORDER — METRONIDAZOLE 500 MG PO TABS: 500.0000 mg | ORAL_TABLET | Freq: Two times a day (BID) | ORAL | Status: DC

## 2015-06-04 NOTE — Telephone Encounter (Signed)
Patient is requesting a refill of Metronidazole for BV. She gets it occasionally and Dr Jodi Mourning will call in treatment for her.

## 2015-06-13 ENCOUNTER — Ambulatory Visit
Admission: RE | Admit: 2015-06-13 | Discharge: 2015-06-13 | Disposition: A | Discharge status: Home / Self Care | Payer: BLUE CROSS/BLUE SHIELD | Source: Ambulatory Visit | Attending: Internal Medicine | Admitting: Internal Medicine

## 2015-06-13 DIAGNOSIS — Z1231 Encounter for screening mammogram for malignant neoplasm of breast: Secondary | ICD-10-CM

## 2015-06-18 ENCOUNTER — Other Ambulatory Visit: Payer: Self-pay | Admitting: Internal Medicine

## 2015-06-18 DIAGNOSIS — R928 Other abnormal and inconclusive findings on diagnostic imaging of breast: Secondary | ICD-10-CM

## 2015-06-18 LAB — HM MAMMOGRAPHY: HM Mammogram: ABNORMAL

## 2015-06-20 ENCOUNTER — Other Ambulatory Visit: Payer: Self-pay

## 2015-06-20 ENCOUNTER — Other Ambulatory Visit: Payer: Self-pay | Admitting: Internal Medicine

## 2015-06-20 DIAGNOSIS — R928 Other abnormal and inconclusive findings on diagnostic imaging of breast: Secondary | ICD-10-CM

## 2015-06-21 ENCOUNTER — Ambulatory Visit
Admission: RE | Admit: 2015-06-21 | Discharge: 2015-06-21 | Disposition: A | Discharge status: Home / Self Care | Payer: BLUE CROSS/BLUE SHIELD | Source: Ambulatory Visit | Attending: Internal Medicine | Admitting: Internal Medicine

## 2015-06-21 DIAGNOSIS — R928 Other abnormal and inconclusive findings on diagnostic imaging of breast: Secondary | ICD-10-CM

## 2015-06-21 LAB — HM MAMMOGRAPHY

## 2015-09-19 ENCOUNTER — Ambulatory Visit (INDEPENDENT_AMBULATORY_CARE_PROVIDER_SITE_OTHER): Payer: BLUE CROSS/BLUE SHIELD | Admitting: Internal Medicine

## 2015-09-19 ENCOUNTER — Encounter: Payer: Self-pay | Admitting: Internal Medicine

## 2015-09-19 VITALS — BP 128/80 | HR 99 | Temp 98.6°F | Resp 16 | Ht 65.0 in | Wt 186.0 lb

## 2015-09-19 DIAGNOSIS — H6983 Other specified disorders of Eustachian tube, bilateral: Secondary | ICD-10-CM

## 2015-09-19 DIAGNOSIS — E669 Obesity, unspecified: Secondary | ICD-10-CM | POA: Diagnosis not present

## 2015-09-19 MED ORDER — CETIRIZINE-PSEUDOEPHEDRINE ER 5-120 MG PO TB12: 1.0000 | ORAL_TABLET | Freq: Two times a day (BID) | ORAL | Status: DC

## 2015-09-19 MED ORDER — PHENTERMINE HCL 37.5 MG PO CAPS: 37.5000 mg | ORAL_CAPSULE | ORAL | Status: DC

## 2015-09-19 MED ORDER — METHYLPREDNISOLONE ACETATE 80 MG/ML IJ SUSP
120.0000 mg | Freq: Once | INTRAMUSCULAR | Status: AC
  Administered 2015-09-19: 120 mg via INTRAMUSCULAR

## 2015-09-19 NOTE — Progress Notes (Signed)
Subjective:  Patient ID: Stacey Ruiz, female    DOB: 1975/08/05  Age: 40 y.o. MRN: [id number]  CC: Ear Fullness   HPI Stacey Ruiz presents for complaints of a one-week history of fullness in both ears, worse on the left than the right. She is flying to [facility] in 5 days. She is not taking anything to control her symptoms. She describes the fullness as a popping and crackling sensation but has not had any loss of hearing/dizziness/vertigo/blood or drainage from the ear/rash/lymphadenopathy. She denies facial pain/nasal congestion/wheezing/postnasal drip/runny nose/or throat.  Outpatient Prescriptions Prior to Visit  Medication Sig Dispense Refill  . amLODipine (NORVASC) 5 MG tablet Take 1 tablet (5 mg total) by mouth daily. 90 tablet 3  . diazepam (VALIUM) 5 MG tablet Take 1 tablet (5 mg total) by mouth every 12 (twelve) hours as needed for anxiety. 25 tablet 1  . irbesartan (AVAPRO) 300 MG tablet Take 1 tablet (300 mg total) by mouth daily. 90 tablet 3  . meloxicam (MOBIC) 7.5 MG tablet Reported on 05/20/2015  0  . phentermine 37.5 MG capsule Take 1 capsule (37.5 mg total) by mouth every morning. 30 capsule 2  . metroNIDAZOLE (FLAGYL) 500 MG tablet Take 1 tablet (500 mg total) by mouth 2 (two) times daily. (Patient not taking: Reported on 09/19/2015) 14 tablet 2   No facility-administered medications prior to visit.    ROS Review of Systems  Constitutional: Negative.  Negative for fever, chills and fatigue.  HENT: Positive for ear pain. Negative for congestion, ear discharge, facial swelling, hearing loss, nosebleeds, postnasal drip, rhinorrhea, sinus pressure, sneezing, sore throat, tinnitus, trouble swallowing and voice change.   Eyes: Negative.  Negative for visual disturbance.  Respiratory: Negative.  Negative for cough, choking, chest tightness, shortness of breath and stridor.   Cardiovascular: Negative.  Negative for chest pain, palpitations and leg swelling.    Gastrointestinal: Negative.  Negative for abdominal pain.  Endocrine: Negative.   Genitourinary: Negative.   Musculoskeletal: Negative.  Negative for myalgias, back pain, arthralgias and neck pain.  Skin: Negative.  Negative for color change and rash.  Allergic/Immunologic: Negative.   Neurological: Negative.  Negative for dizziness, tremors, weakness, light-headedness and numbness.  Hematological: Negative.  Negative for adenopathy. Does not bruise/bleed easily.  Psychiatric/Behavioral: Negative.     Objective:  BP 128/80 mmHg  Pulse 99  Temp(Src) 98.6 F (37 C) (Oral)  Resp 16  Ht 5\' 5"  (1.651 m)  Wt 186 lb (84.369 kg)  BMI 30.95 kg/m2  SpO2 99%  LMP 08/24/2015  BP Readings from Last 3 Encounters:  09/19/15 128/80  05/20/15 138/88  04/19/15 170/120    Wt Readings from Last 3 Encounters:  09/19/15 186 lb (84.369 kg)  05/20/15 211 lb (95.709 kg)  04/19/15 202 lb (91.627 kg)    Physical Exam  Constitutional: She is oriented to person, place, and time. No distress.  HENT:  Right Ear: Hearing, tympanic membrane, external ear and ear canal normal.  Left Ear: Hearing, tympanic membrane, external ear and ear canal normal.  Nose: No mucosal edema, rhinorrhea or sinus tenderness. Right sinus exhibits no maxillary sinus tenderness and no frontal sinus tenderness. Left sinus exhibits no maxillary sinus tenderness and no frontal sinus tenderness.  Mouth/Throat: Mucous membranes are normal. Mucous membranes are not pale, not dry and not cyanotic. Posterior oropharyngeal erythema (mild lymphoid hyperplasia) present. No oropharyngeal exudate, posterior oropharyngeal edema or tonsillar abscesses.  Eyes: Conjunctivae are normal. Right eye exhibits no discharge.  Left eye exhibits no discharge. No scleral icterus.  Neck: Normal range of motion. Neck supple. No JVD present. No tracheal deviation present. No thyromegaly present.  Cardiovascular: Normal rate, regular rhythm, normal heart  sounds and intact distal pulses.  Exam reveals no gallop and no friction rub.   No murmur heard. Pulmonary/Chest: Effort normal and breath sounds normal. No stridor. No respiratory distress. She has no wheezes. She has no rales. She exhibits no tenderness.  Abdominal: Soft. Bowel sounds are normal. She exhibits no distension and no mass. There is no tenderness. There is no rebound and no guarding.  Musculoskeletal: Normal range of motion. She exhibits no edema or tenderness.  Lymphadenopathy:    She has no cervical adenopathy.  Neurological: She is oriented to person, place, and time.  Skin: Skin is warm and dry. No rash noted. She is not diaphoretic. No erythema. No pallor.  Psychiatric: She has a normal mood and affect. Her behavior is normal. Judgment and thought content normal.  Vitals reviewed.   Lab Results  Component Value Date   WBC 6.8 05/20/2015   HGB 13.4 05/20/2015   HCT 39.6 05/20/2015   PLT 261.0 05/20/2015   GLUCOSE 99 05/20/2015   CHOL 140 05/20/2015   TRIG 126.0 05/20/2015   HDL 37.80* 05/20/2015   LDLCALC 77 05/20/2015   ALT 12 05/20/2015   AST 13 05/20/2015   NA 139 05/20/2015   K 4.1 05/20/2015   CL 104 05/20/2015   CREATININE 0.75 05/20/2015   BUN 8 05/20/2015   CO2 28 05/20/2015   TSH 0.61 05/20/2015   INR 0.9 05/20/2011   HGBA1C 5.6 05/20/2015    US Breast [place]  06/21/2015  CLINICAL DATA:  Screening recall for an asymmetry in the retroareolar left breast. EXAM: DIGITAL DIAGNOSTIC LEFT MAMMOGRAM WITH 3D TOMOSYNTHESIS AND CAD LEFT BREAST ULTRASOUND COMPARISON:  Previous exam(s). ACR Breast Density Category b: There are scattered areas of fibroglandular density. FINDINGS: Spot compression CC and MLO tomosynthesis was performed of the retroareolar left breast. The initially questioned possible left breast asymmetry persists on the spot compression views however demonstrates features most suggestive of dense fibroglandular tissue.  Mammographic images were processed with CAD. Physical examination of the left areaola and retroareolar left breast does not reveal any palpable abnormalities. The left breast is slightly smaller than the right, however the patient states this asymmetry has always been present. Targeted ultrasound of the subareolar left breast was performed. No discrete masses or abnormalities are seen, only extremely dense fibroglandular tissue is seen in the retroareolar and slightly lateral left breast. This corresponds with mammography findings. IMPRESSION: Initially questioned left breast asymmetry resolves on the additional imaging with findings compatible with focally dense fibroglandular tissue. There is no mammographic evidence of malignancy in the left breast. RECOMMENDATION: Screening mammogram in one year.(Code:SM-B-01Y) I have discussed the findings and recommendations with the patient. Results were also provided in writing at the conclusion of the visit. If applicable, a reminder letter will be sent to the patient regarding the next appointment. BI-RADS CATEGORY  1: Negative. Electronically Signed   By: Everlean Alstrom M.D.   On: 06/21/2015 09:18   Mm Diag Breast [place] Left  06/21/2015  CLINICAL DATA:  Screening recall for an asymmetry in the retroareolar left breast. EXAM: DIGITAL DIAGNOSTIC LEFT MAMMOGRAM WITH 3D TOMOSYNTHESIS AND CAD LEFT BREAST ULTRASOUND COMPARISON:  Previous exam(s). ACR Breast Density Category b: There are scattered areas of fibroglandular density. FINDINGS: Spot compression CC  and MLO tomosynthesis was performed of the retroareolar left breast. The initially questioned possible left breast asymmetry persists on the spot compression views however demonstrates features most suggestive of dense fibroglandular tissue. Mammographic images were processed with CAD. Physical examination of the left areaola and retroareolar left breast does not reveal any palpable abnormalities. The left breast is  slightly smaller than the right, however the patient states this asymmetry has always been present. Targeted ultrasound of the subareolar left breast was performed. No discrete masses or abnormalities are seen, only extremely dense fibroglandular tissue is seen in the retroareolar and slightly lateral left breast. This corresponds with mammography findings. IMPRESSION: Initially questioned left breast asymmetry resolves on the additional imaging with findings compatible with focally dense fibroglandular tissue. There is no mammographic evidence of malignancy in the left breast. RECOMMENDATION: Screening mammogram in one year.(Code:SM-B-01Y) I have discussed the findings and recommendations with the patient. Results were also provided in writing at the conclusion of the visit. If applicable, a reminder letter will be sent to the patient regarding the next appointment. BI-RADS CATEGORY  1: Negative. Electronically Signed   By: Everlean Alstrom M.D.   On: 06/21/2015 09:18    Assessment & Plan:   Stacey Ruiz was seen today for ear fullness.  Diagnoses and all orders for this visit:  Obesity (BMI 30.0-34.9)- in addition to lifestyle modifications such as diet and exercise she will try another course of phentermine to help her lose weight -     phentermine 37.5 MG capsule; Take 1 capsule (37.5 mg total) by mouth every morning.  Eustachian tube dysfunction, bilateral- since she is flying in 5 days so I will treat this aggressively with an oral decongestant antihistamine combination as well as a course of systemic steroids. -     cetirizine-pseudoephedrine (ZYRTEC-D ALLERGY & CONGESTION) 5-120 MG tablet; Take 1 tablet by mouth 2 (two) times daily. -     methylPREDNISolone acetate (DEPO-MEDROL) injection 120 mg; Inject 1.5 mLs (120 mg total) into the muscle once.   I have discontinued Stacey Ruiz's metroNIDAZOLE. I am also having her start on cetirizine-pseudoephedrine. Additionally, I am having her maintain her  meloxicam, irbesartan, amLODipine, diazepam, and phentermine. We administered methylPREDNISolone acetate.  Meds ordered this encounter  Medications  . phentermine 37.5 MG capsule    Sig: Take 1 capsule (37.5 mg total) by mouth every morning.    Dispense:  30 capsule    Refill:  2  . cetirizine-pseudoephedrine (ZYRTEC-D ALLERGY & CONGESTION) 5-120 MG tablet    Sig: Take 1 tablet by mouth 2 (two) times daily.    Dispense:  60 tablet    Refill:  3  . methylPREDNISolone acetate (DEPO-MEDROL) injection 120 mg    Sig:      Follow-up: Return if symptoms worsen or fail to improve.  Scarlette Calico, MD

## 2015-09-19 NOTE — Progress Notes (Signed)
Pre visit review using our clinic review tool, if applicable. No additional management support is needed unless otherwise documented below in the visit note. 

## 2015-09-19 NOTE — Patient Instructions (Signed)

## 2015-12-02 ENCOUNTER — Ambulatory Visit: Payer: Self-pay | Admitting: Obstetrics & Gynecology

## 2016-01-11 ENCOUNTER — Other Ambulatory Visit: Payer: Self-pay | Admitting: Internal Medicine

## 2016-01-11 DIAGNOSIS — E669 Obesity, unspecified: Secondary | ICD-10-CM

## 2016-01-13 ENCOUNTER — Encounter: Payer: Self-pay | Admitting: Obstetrics

## 2016-01-13 ENCOUNTER — Ambulatory Visit (INDEPENDENT_AMBULATORY_CARE_PROVIDER_SITE_OTHER): Payer: BLUE CROSS/BLUE SHIELD | Admitting: Obstetrics

## 2016-01-13 ENCOUNTER — Encounter: Payer: Self-pay | Admitting: *Deleted

## 2016-01-13 VITALS — BP 113/75 | HR 73 | Temp 98.2°F | Ht 65.0 in | Wt 171.2 lb

## 2016-01-13 DIAGNOSIS — Z113 Encounter for screening for infections with a predominantly sexual mode of transmission: Secondary | ICD-10-CM

## 2016-01-13 DIAGNOSIS — R300 Dysuria: Secondary | ICD-10-CM

## 2016-01-13 LAB — POCT URINALYSIS DIPSTICK
BILIRUBIN UA: NEGATIVE
Glucose, UA: NEGATIVE
Ketones, UA: NEGATIVE
Leukocytes, UA: NEGATIVE
NITRITE UA: NEGATIVE
PH UA: 5
Protein, UA: NEGATIVE
RBC UA: 50
Spec Grav, UA: 1.02
UROBILINOGEN UA: 0.2

## 2016-01-13 NOTE — Telephone Encounter (Signed)
Rx faxed [facility] to [facility]...Johny Chess

## 2016-01-13 NOTE — Progress Notes (Signed)
Patient ID: Stacey Ruiz, female   DOB: [birth year], 40 y.o.   MRN: [id number]  Chief Complaint  Patient presents with  . other    STD testing  . other    Possible UTI    HPI Stacey Ruiz is a 40 y.o. female.  Urinary frequency and urgency.  Recently started a new sexual relationship, and is concerned about contracting an STD. HPI  Past Medical History:  Diagnosis Date  . Anxiety   . Depression   . Headache(784.0)    migraines  . Hypertension     Past Surgical History:  Procedure Laterality Date  . ABLATION    . FRACTURE SURGERY  2002   left wrist  . TUBAL LIGATION  11/28/2010   Procedure: ESSURE TUBAL STERILIZATION;  Surgeon: Agnes Lawrence, MD;  Location: [facility];  Service: Gynecology;  Laterality: N/A;  Attempted essure sterilization. Essure device placed in leftt side only. could not do procedure on right fallopian tube.   . TUBAL LIGATION  12/05/2010   Procedure: ESSURE TUBAL STERILIZATION;  Surgeon: Agnes Lawrence, MD;  Location: [facility];  Service: Gynecology;  Laterality: Right;    Family History  Problem Relation Age of Onset  . Diabetes Neg Hx   . Early death Neg Hx   . Heart disease Neg Hx   . Hyperlipidemia Neg Hx   . Hypertension Neg Hx   . Kidney disease Neg Hx   . Stroke Neg Hx   . Cancer Mother     Social History Social History  Substance Use Topics  . Smoking status: Former Smoker    Types: Cigarettes    Quit date: 11/12/2007  . Smokeless tobacco: Never Used  . Alcohol use 4.2 oz/week    7 Glasses of wine per week    No Known Allergies  Current Outpatient Prescriptions  Medication Sig Dispense Refill  . amLODipine (NORVASC) 5 MG tablet Take 1 tablet (5 mg total) by mouth daily. 90 tablet 3  . cetirizine-pseudoephedrine (ZYRTEC-D ALLERGY & CONGESTION) 5-120 MG tablet Take 1 tablet by mouth 2 (two) times daily. 60 tablet 3  . diazepam (VALIUM) 5 MG tablet Take 1 tablet (5 mg total) by mouth every 12 (twelve) hours as needed for  anxiety. 25 tablet 1  . irbesartan (AVAPRO) 300 MG tablet Take 1 tablet (300 mg total) by mouth daily. 90 tablet 3  . phentermine 37.5 MG capsule take 1 capsule by mouth every morning 30 capsule 2  . meloxicam (MOBIC) 7.5 MG tablet Reported on 05/20/2015  0   No current facility-administered medications for this visit.     Review of Systems Review of Systems Constitutional: negative for fatigue and weight loss Respiratory: negative for cough and wheezing Cardiovascular: negative for chest pain, fatigue and palpitations Gastrointestinal: negative for abdominal pain and change in bowel habits Genitourinary:positive for urinary frequency and urgency Integument/breast: negative for nipple discharge Musculoskeletal:negative for myalgias Neurological: negative for gait problems and tremors Behavioral/Psych: negative for abusive relationship, depression Endocrine: negative for temperature intolerance     Blood pressure 113/75, pulse 73, temperature 98.2 F (36.8 C), temperature source Oral, height 5\' 5"  (1.651 m), weight 171 lb 3.2 oz (77.7 kg), last menstrual period 01/10/2016.  Physical Exam Physical Exam            General:  Alert and no distress Abdomen:  normal findings: no organomegaly, soft, non-tender and no hernia  Pelvis:  External genitalia: normal general appearance Urinary system: urethral meatus normal and  bladder without fullness, nontender Vaginal: normal without tenderness, induration or masses Cervix: normal appearance Adnexa: normal bimanual exam Uterus: anteverted and non-tender, normal size    50% of 15 min visit spent on counseling and coordination of care.   Data Reviewed Urinalysis  Assessment     Urinary frequency and urgency Screening for STD    Plan     Wet prep and cultures done  Urine culture sent  F/U prn  Orders Placed This Encounter  Procedures  . Urine culture  . HIV antibody  . Hepatitis B surface antigen  . RPR  . Hepatitis C  antibody  . POCT Urinalysis Dipstick   No orders of the defined types were placed in this encounter.

## 2016-01-14 LAB — GC/CHLAMYDIA PROBE AMP (~~LOC~~) NOT AT ARMC
Chlamydia: NEGATIVE
Neisseria Gonorrhea: NEGATIVE

## 2016-01-14 LAB — RPR: RPR: NONREACTIVE

## 2016-01-14 LAB — HEPATITIS B SURFACE ANTIGEN: Hepatitis B Surface Ag: NEGATIVE

## 2016-01-14 LAB — HIV ANTIBODY (ROUTINE TESTING W REFLEX): HIV Screen 4th Generation wRfx: NONREACTIVE

## 2016-01-14 LAB — HEPATITIS C ANTIBODY

## 2016-01-15 LAB — URINE CULTURE

## 2016-01-17 NOTE — Telephone Encounter (Signed)
Pt left msg on triage stating that pharmacy told her she would have to bring written rx for the phentermine. Requestignt o pick-up script. Called [facility] spoke w/Patrice verified pt msg. Per Sharl Ma not sure who inform her of that. Verified if rx was received on 10/9 she stated they did not received. Gave verbal authorization form 10/9. Notified pt rx was called to pharmacy...Stacey Ruiz

## 2016-01-30 DIAGNOSIS — M25562 Pain in left knee: Secondary | ICD-10-CM | POA: Diagnosis not present

## 2016-05-12 ENCOUNTER — Encounter (HOSPITAL_COMMUNITY): Payer: Self-pay

## 2016-05-12 ENCOUNTER — Emergency Department (HOSPITAL_COMMUNITY)
Admission: EM | Admit: 2016-05-12 | Discharge: 2016-05-12 | Disposition: A | Discharge status: Home / Self Care | Payer: BLUE CROSS/BLUE SHIELD | Attending: Emergency Medicine | Admitting: Emergency Medicine

## 2016-05-12 DIAGNOSIS — Z79899 Other long term (current) drug therapy: Secondary | ICD-10-CM | POA: Diagnosis not present

## 2016-05-12 DIAGNOSIS — N9489 Other specified conditions associated with female genital organs and menstrual cycle: Secondary | ICD-10-CM | POA: Insufficient documentation

## 2016-05-12 DIAGNOSIS — Z87891 Personal history of nicotine dependence: Secondary | ICD-10-CM | POA: Diagnosis not present

## 2016-05-12 DIAGNOSIS — I10 Essential (primary) hypertension: Secondary | ICD-10-CM | POA: Insufficient documentation

## 2016-05-12 DIAGNOSIS — K529 Noninfective gastroenteritis and colitis, unspecified: Secondary | ICD-10-CM | POA: Insufficient documentation

## 2016-05-12 DIAGNOSIS — B349 Viral infection, unspecified: Secondary | ICD-10-CM | POA: Diagnosis not present

## 2016-05-12 DIAGNOSIS — R112 Nausea with vomiting, unspecified: Secondary | ICD-10-CM | POA: Diagnosis not present

## 2016-05-12 LAB — COMPREHENSIVE METABOLIC PANEL
ALK PHOS: 52 U/L (ref 38–126)
ALT: 17 U/L (ref 14–54)
AST: 30 U/L (ref 15–41)
Albumin: 4.8 g/dL (ref 3.5–5.0)
Anion gap: 16 — ABNORMAL HIGH (ref 5–15)
BILIRUBIN TOTAL: 1 mg/dL (ref 0.3–1.2)
BUN: 6 mg/dL (ref 6–20)
CALCIUM: 9.6 mg/dL (ref 8.9–10.3)
CHLORIDE: 96 mmol/L — AB (ref 101–111)
CO2: 23 mmol/L (ref 22–32)
CREATININE: 0.77 mg/dL (ref 0.44–1.00)
GFR calc Af Amer: >60 mL/min (ref >60)
Glucose, Bld: 171 mg/dL — ABNORMAL HIGH (ref 65–99)
Potassium: 2.8 mmol/L — ABNORMAL LOW (ref 3.5–5.1)
Sodium: 135 mmol/L (ref 135–145)
Total Protein: 8 g/dL (ref 6.5–8.1)

## 2016-05-12 LAB — CBC WITH DIFFERENTIAL/PLATELET
BASOS PCT: 0 %
Basophils Absolute: 0 10*3/uL (ref 0.0–0.1)
EOS ABS: 0 10*3/uL (ref 0.0–0.7)
EOS PCT: 0 %
HCT: 42.4 % (ref 36.0–46.0)
Hemoglobin: 15.3 g/dL — ABNORMAL HIGH (ref 12.0–15.0)
LYMPHS ABS: 1.6 10*3/uL (ref 0.7–4.0)
Lymphocytes Relative: 22 %
MCH: 31 pg (ref 26.0–34.0)
MCHC: 36.1 g/dL — AB (ref 30.0–36.0)
MCV: 85.8 fL (ref 78.0–100.0)
Monocytes Absolute: 0.2 10*3/uL (ref 0.1–1.0)
Monocytes Relative: 4 %
Neutro Abs: 5.1 10*3/uL (ref 1.7–7.7)
Neutrophils Relative %: 74 %
PLATELETS: 287 10*3/uL (ref 150–400)
RBC: 4.94 MIL/uL (ref 3.87–5.11)
RDW: 12.2 % (ref 11.5–15.5)
WBC: 6.9 10*3/uL (ref 4.0–10.5)

## 2016-05-12 LAB — URINALYSIS, ROUTINE W REFLEX MICROSCOPIC
Bacteria, UA: NONE SEEN
Bilirubin Urine: NEGATIVE
GLUCOSE, UA: 150 mg/dL — AB
Hgb urine dipstick: NEGATIVE
Ketones, ur: 20 mg/dL — AB
Leukocytes, UA: NEGATIVE
Nitrite: NEGATIVE
PH: 8 (ref 5.0–8.0)
Protein, ur: 100 mg/dL — AB
Specific Gravity, Urine: 1.016 (ref 1.005–1.030)

## 2016-05-12 LAB — I-STAT CG4 LACTIC ACID, ED
Lactic Acid, Venous: 3.61 mmol/L (ref 0.5–1.9)
Lactic Acid, Venous: 3.89 mmol/L (ref 0.5–1.9)
Lactic Acid, Venous: 0.74 mmol/L (ref 0.5–1.9)

## 2016-05-12 LAB — HCG, QUANTITATIVE, PREGNANCY: hCG, Beta Chain, Quant, S: <1 m[IU]/mL (ref <5)

## 2016-05-12 LAB — LIPASE, BLOOD: Lipase: 16 U/L (ref 11–51)

## 2016-05-12 MED ORDER — SODIUM CHLORIDE 0.9 % IV BOLUS (SEPSIS)
1000.0000 mL | Freq: Once | INTRAVENOUS | Status: AC
  Administered 2016-05-12: 1000 mL via INTRAVENOUS

## 2016-05-12 MED ORDER — SODIUM CHLORIDE 0.9 % IV SOLN
30.0000 meq | Freq: Once | INTRAVENOUS | Status: AC
  Administered 2016-05-12: 30 meq via INTRAVENOUS
  Filled 2016-05-12 (×2): qty 15

## 2016-05-12 MED ORDER — ONDANSETRON HCL 4 MG/2ML IJ SOLN
4.0000 mg | Freq: Once | INTRAMUSCULAR | Status: AC
  Administered 2016-05-12: 4 mg via INTRAVENOUS
  Filled 2016-05-12: qty 2

## 2016-05-12 MED ORDER — ONDANSETRON HCL 4 MG/2ML IJ SOLN
INTRAMUSCULAR | Status: AC
  Administered 2016-05-12: 4 mg
  Filled 2016-05-12: qty 2

## 2016-05-12 MED ORDER — ONDANSETRON 4 MG PO TBDP
ORAL_TABLET | ORAL | Status: AC
  Administered 2016-05-12: 4 mg
  Filled 2016-05-12: qty 1

## 2016-05-12 MED ORDER — PROMETHAZINE HCL 25 MG PO TABS: 25.0000 mg | ORAL_TABLET | Freq: Three times a day (TID) | ORAL | 0 refills | Status: DC | PRN

## 2016-05-12 MED ORDER — PROMETHAZINE HCL 25 MG/ML IJ SOLN
25.0000 mg | Freq: Once | INTRAMUSCULAR | Status: AC
  Administered 2016-05-12: 25 mg via INTRAVENOUS
  Filled 2016-05-12: qty 1

## 2016-05-12 NOTE — ED Notes (Signed)
Pt still vomiting in triage waiting area after zofran odt, IV access gained and pt given 4mg  zofran IV.

## 2016-05-12 NOTE — ED Triage Notes (Signed)
Pt endorses multiple vomiting and diarrhea episodes since 1800 this evening. Pt dry heaving in triage.

## 2016-05-12 NOTE — Discharge Instructions (Signed)
Return here as needed.  Follow-up with a primary care doctor.  Slowly increase her fluid intake and rest as much as possible

## 2016-05-12 NOTE — ED Notes (Signed)
[facility] called to given LA result of 3.66

## 2016-05-12 NOTE — ED Provider Notes (Signed)
Patient's lactic acid, normalized after IV fluids.  Patient be discharged home with a diagnosis of gastroenteritis.  She has been stable.  Told to increase her fluid intake with   Dalia Heading, PA-C 05/12/16 1012    Leo Grosser, MD 05/12/16 1755

## 2016-05-12 NOTE — ED Provider Notes (Signed)
[facility] DEPT Provider Note   CSN: [id number] Arrival date & time: 05/12/16  0201     History   Chief Complaint Chief Complaint  Patient presents with  . Emesis    HPI Stacey Ruiz is a 41 y.o. female.  HPI   41 year old female presents today with complaints of nausea vomiting and diarrhea. Patient reports symptoms started shortly before arrival to the emergency room. Patient reports persistent vomiting every several minutes. She notes 1 episode of diarrhea. She reports minor abdominal cramping, denies any point tender abdominal discomfort. Patient denies fever, denies any exposure to abnormal food or drink. She reports her son was diagnosed with strep, but no other close sick contacts. No upper respiratory symptoms.   Past Medical History:  Diagnosis Date  . Anxiety   . Depression   . Headache(784.0)    migraines  . Hypertension     Patient Active Problem List   Diagnosis Date Noted  . Visit for screening mammogram 05/20/2015  . Hyperglycemia 06/20/2014  . Leukocytosis, unspecified 09/29/2013  . Obesity (BMI 30.0-34.9) 11/28/2012  . Fear of flying 10/12/2011  . Other abnormal glucose 09/17/2011  . Essential hypertension, benign 09/07/2011  . Routine general medical examination at a health care facility 09/07/2011  . Contraceptive management 11/28/2010    Past Surgical History:  Procedure Laterality Date  . ABLATION    . FRACTURE SURGERY  2002   left wrist  . TUBAL LIGATION  11/28/2010   Procedure: ESSURE TUBAL STERILIZATION;  Surgeon: Agnes Lawrence, MD;  Location: [facility];  Service: Gynecology;  Laterality: N/A;  Attempted essure sterilization. Essure device placed in leftt side only. could not do procedure on right fallopian tube.   . TUBAL LIGATION  12/05/2010   Procedure: ESSURE TUBAL STERILIZATION;  Surgeon: Agnes Lawrence, MD;  Location: [facility];  Service: Gynecology;  Laterality: Right;    OB History    Gravida Para Term Preterm AB  Living   1             SAB TAB Ectopic Multiple Live Births                   Home Medications    Prior to Admission medications   Medication Sig Start Date End Date Taking? Authorizing Provider  amLODipine (NORVASC) 5 MG tablet Take 1 tablet (5 mg total) by mouth daily. 05/20/15   Janith Lima, MD  cetirizine-pseudoephedrine (ZYRTEC-D ALLERGY & CONGESTION) 5-120 MG tablet Take 1 tablet by mouth 2 (two) times daily. 09/19/15   Janith Lima, MD  diazepam (VALIUM) 5 MG tablet Take 1 tablet (5 mg total) by mouth every 12 (twelve) hours as needed for anxiety. 05/20/15   Janith Lima, MD  irbesartan (AVAPRO) 300 MG tablet Take 1 tablet (300 mg total) by mouth daily. 05/20/15   Janith Lima, MD  meloxicam [facility]) 7.5 MG tablet Reported on 05/20/2015 04/16/14   Historical Provider, MD  phentermine 37.5 MG capsule take 1 capsule by mouth every morning 01/11/16   Janith Lima, MD    Family History Family History  Problem Relation Age of Onset  . Cancer Mother   . Diabetes Neg Hx   . Early death Neg Hx   . Heart disease Neg Hx   . Hyperlipidemia Neg Hx   . Hypertension Neg Hx   . Kidney disease Neg Hx   . Stroke Neg Hx     Social History Social History  Substance Use  Topics  . Smoking status: Former Smoker    Types: Cigarettes    Quit date: 11/12/2007  . Smokeless tobacco: Never Used  . Alcohol use 4.2 oz/week    7 Glasses of wine per week     Comment: occ     Allergies   Patient has no known allergies.   Review of Systems Review of Systems  All other systems reviewed and are negative.   Physical Exam Updated Vital Signs BP 148/88   Pulse (!) 51   Temp 97.5 F (36.4 C) (Oral)   Resp 16   Ht 5\' 5"  (1.651 m)   Wt 81.6 kg   LMP 05/04/2016 (Approximate)   SpO2 100%   BMI 29.95 kg/m   Physical Exam  Constitutional: She is oriented to person, place, and time. She appears well-developed and well-nourished.  HENT:  Head: Normocephalic and atraumatic.  Eyes:  Conjunctivae are normal. Pupils are equal, round, and reactive to light. Right eye exhibits no discharge. Left eye exhibits no discharge. No scleral icterus.  Neck: Normal range of motion. No JVD present. No tracheal deviation present.  Pulmonary/Chest: Effort normal. No stridor.  Abdominal: Soft. She exhibits no distension. There is no tenderness.  Neurological: She is alert and oriented to person, place, and time. Coordination normal.  Skin: Skin is warm.  Psychiatric: She has a normal mood and affect. Her behavior is normal. Judgment and thought content normal.  Nursing note and vitals reviewed.    ED Treatments / Results  Labs (all labs ordered are listed, but only abnormal results are displayed) Labs Reviewed  COMPREHENSIVE METABOLIC PANEL - Abnormal; Notable for the following:       Result Value   Potassium 2.8 (*)    Chloride 96 (*)    Glucose, Bld 171 (*)    Anion gap 16 (*)    All other components within normal limits  CBC WITH DIFFERENTIAL/PLATELET - Abnormal; Notable for the following:    Hemoglobin 15.3 (*)    MCHC 36.1 (*)    All other components within normal limits  URINALYSIS, ROUTINE W REFLEX MICROSCOPIC - Abnormal; Notable for the following:    APPearance HAZY (*)    Glucose, UA 150 (*)    Ketones, ur 20 (*)    Protein, ur 100 (*)    Squamous Epithelial / LPF 0-5 (*)    All other components within normal limits  I-STAT CG4 LACTIC ACID, ED - Abnormal; Notable for the following:    Lactic Acid, Venous 3.61 (*)    All other components within normal limits  I-STAT CG4 LACTIC ACID, ED - Abnormal; Notable for the following:    Lactic Acid, Venous 3.89 (*)    All other components within normal limits  LIPASE, BLOOD  HCG, QUANTITATIVE, PREGNANCY    EKG  EKG Interpretation None       Radiology No results found.  Procedures Procedures (including critical care time)  Medications Ordered in ED Medications  potassium chloride 30 mEq in sodium chloride  0.9 % 265 mL (KCL MULTIRUN) IVPB (30 mEq Intravenous Given 05/12/16 0639)  sodium chloride 0.9 % bolus 1,000 mL (not administered)  ondansetron (ZOFRAN) injection 4 mg (not administered)  ondansetron (ZOFRAN-ODT) 4 MG disintegrating tablet (4 mg  Given 05/12/16 0212)  ondansetron (ZOFRAN) 4 MG/2ML injection (4 mg  Given 05/12/16 0247)  sodium chloride 0.9 % bolus 1,000 mL (0 mLs Intravenous Stopped 05/12/16 0500)  promethazine (PHENERGAN) injection 25 mg (25 mg Intravenous Given 05/12/16 0330)  sodium chloride 0.9 % bolus 1,000 mL (1,000 mLs Intravenous New Bag/Given 05/12/16 0525)     Initial Impression / Assessment and Plan / ED Course  I have reviewed the triage vital signs and the nursing notes.  Pertinent labs & imaging results that were available during my care of the patient were reviewed by me and considered in my medical decision making (see chart for details).      Final Clinical Impressions(s) / ED Diagnoses   Final diagnoses:  Viral illness    Labs: Lactic acid, CMP, CBC, urinalysis, lipase, hCG  Imaging:  Consults:  Therapeutics: Zofran, promethazine and a normal saline, potassium  Discharge Meds:   Assessment/Plan:  41 year old female presents today with acute onset of nausea vomiting and diarrhea. Patient having several episodes of vomiting while here in the ED. She is afebrile, has no point abdominal tenderness. Patient was noted to have elevated lactic acid of 3.61. Lactic was then 2 hours later showing slight bump at 3.89. Patient is currently receiving IV fluids and antibiotics. Patient with vomiting after Zofran, improved with promethazine. She was also noted to be hypokalemic at 2.8. Case was discussed with attending provider. Pt will receive 3 L of saline down here, repeat lactic acid, repeat CMP and disposition pending reassessment. Patient care shared with oncoming provider pending fluid hydration and reassessment and disposition.     New Prescriptions New  Prescriptions   No medications on file     Okey Regal, PA-C 05/12/16 [place], MD 05/12/16 [phone]

## 2016-05-12 NOTE — ED Notes (Signed)
Pharmacy messaged through [facility] regarding missing potassium dose.

## 2016-05-12 NOTE — ED Notes (Signed)
Pharmacy called regarding missing potassium.  Stated they will send.

## 2016-05-15 ENCOUNTER — Ambulatory Visit (INDEPENDENT_AMBULATORY_CARE_PROVIDER_SITE_OTHER): Payer: BLUE CROSS/BLUE SHIELD | Admitting: Internal Medicine

## 2016-05-15 ENCOUNTER — Encounter: Payer: Self-pay | Admitting: Internal Medicine

## 2016-05-15 ENCOUNTER — Encounter: Payer: Self-pay | Admitting: Family Medicine

## 2016-05-15 VITALS — BP 144/96 | Temp 98.7°F | Wt 168.0 lb

## 2016-05-15 DIAGNOSIS — I10 Essential (primary) hypertension: Secondary | ICD-10-CM

## 2016-05-15 DIAGNOSIS — E876 Hypokalemia: Secondary | ICD-10-CM

## 2016-05-15 DIAGNOSIS — R739 Hyperglycemia, unspecified: Secondary | ICD-10-CM | POA: Diagnosis not present

## 2016-05-15 DIAGNOSIS — R51 Headache: Secondary | ICD-10-CM | POA: Diagnosis not present

## 2016-05-15 DIAGNOSIS — R110 Nausea: Secondary | ICD-10-CM | POA: Diagnosis not present

## 2016-05-15 DIAGNOSIS — R519 Headache, unspecified: Secondary | ICD-10-CM

## 2016-05-15 LAB — BASIC METABOLIC PANEL
BUN: 4 mg/dL — ABNORMAL LOW (ref 6–23)
CALCIUM: 9.3 mg/dL (ref 8.4–10.5)
CO2: 34 mEq/L — ABNORMAL HIGH (ref 19–32)
CREATININE: 0.85 mg/dL (ref 0.40–1.20)
Chloride: 102 mEq/L (ref 96–112)
GFR: 94.33 mL/min (ref >60.00)
Glucose, Bld: 67 mg/dL — ABNORMAL LOW (ref 70–99)
Potassium: 3.5 mEq/L (ref 3.5–5.1)
Sodium: 140 mEq/L (ref 135–145)

## 2016-05-15 LAB — HEMOGLOBIN A1C: HEMOGLOBIN A1C: 5.2 % (ref 4.6–6.5)

## 2016-05-15 LAB — MAGNESIUM: MAGNESIUM: 2 mg/dL (ref 1.5–2.5)

## 2016-05-15 NOTE — Patient Instructions (Addendum)
Checking blood work which was abnormal in the emergency room to include a sodium level of potassium level and a blood sugar level. We will also check an A1c test.  Take it easy over the weekend maintain hydration but avoid a lot of sugar drinks. Also check your blood pressure readings at home if they are not at goal which would be 130/80 or thereabouts 1 she to follow-up with Dr. Ronnald Ramp or send him a message. Ibuprofen is okay for pain but it can interfere with kidney function and get you stomach symptoms. And can elevate your blood pressure in that way.

## 2016-05-15 NOTE — Progress Notes (Signed)
Pre visit review using our clinic review tool, if applicable. No additional management support is needed unless otherwise documented below in the visit note.  Chief Complaint  Patient presents with  . Follow-up    Complains of headache, dizziness, nausea and fatigue.    HPI: Stacey Ruiz 41 y.o.  sda   Pr in ed 3 days ago  mPCP elam NA  She had elevaetd  She had nausea vomiting diarrhea was noted to have an elevated lactic acid very low potassium was treated with IV fluids and potassium.t called because today  Still feels bad and had to go home from work yesterday afternoon for dizzy nausea  No vomiting   Diet was off after wisdom  Removed a week  Ago.    Except for sore throiat    Son had  Strep throat    4 ibuprofen  Today for headache  French fried and 2 smoothies .  gatorade  And coffee.     Not  On sugar drinks    Usually trying to lose weight  ROS: See pertinent positives and negatives per HPI. Hasn't taken her blood pressure medicine since before she got sick although may be one pill. Does have a blood pressure monitor at home.  Assessment/Plan:  41 year old female presents today with acute onset of nausea vomiting and diarrhea. Patient having several episodes of vomiting while here in the ED. She is afebrile, has no point abdominal tenderness. Patient was noted to have elevated lactic acid of 3.61. Lactic was then 2 hours later showing slight bump at 3.89. Patient is currently receiving IV fluids and antibiotics. Patient with vomiting after Zofran, improved with promethazine. She was also noted to be hypokalemic at 2.8. Case was discussed with attending provider. Pt will receive 3 L of saline down here, repeat lactic acid, repeat CMP and disposition pending reassessment. Patient care shared with oncoming provider pending fluid hydration and reassessment and disposition. Patient's lactic acid, normalized after IV fluids.  Patient be discharged home with a diagnosis of  gastroenteritis.  She has been stable.  Told to increase her fluid intake with   Dalia Heading, PA-C 05/12/16 1012    Leo Grosser, MD 05/12/16 1755  Past Medical History:  Diagnosis Date  . Anxiety   . Depression   . Headache(784.0)    migraines  . Hypertension     Family History  Problem Relation Age of Onset  . Cancer Mother   . Diabetes Neg Hx   . Early death Neg Hx   . Heart disease Neg Hx   . Hyperlipidemia Neg Hx   . Hypertension Neg Hx   . Kidney disease Neg Hx   . Stroke Neg Hx     Social History   Social History  . Marital status: Married    Spouse name: N/A  . Number of children: N/A  . Years of education: N/A   Social History Main Topics  . Smoking status: Former Smoker    Types: Cigarettes    Quit date: 11/12/2007  . Smokeless tobacco: Never Used  . Alcohol use 4.2 oz/week    7 Glasses of wine per week     Comment: occ  . Drug use: Yes    Types: Marijuana     Comment: occ  . Sexual activity: No   Other Topics Concern  . None   Social History Narrative  . None    Outpatient Medications Prior to Visit  Medication Sig Dispense Refill  .  amLODipine (NORVASC) 5 MG tablet Take 1 tablet (5 mg total) by mouth daily. (Patient not taking: Reported on 05/15/2016) 90 tablet 3  . cetirizine-pseudoephedrine (ZYRTEC-D ALLERGY & CONGESTION) 5-120 MG tablet Take 1 tablet by mouth 2 (two) times daily. (Patient not taking: Reported on 05/12/2016) 60 tablet 3  . diazepam (VALIUM) 5 MG tablet Take 1 tablet (5 mg total) by mouth every 12 (twelve) hours as needed for anxiety. (Patient not taking: Reported on 05/12/2016) 25 tablet 1  . ibuprofen (ADVIL,MOTRIN) 200 MG tablet Take 800 mg by mouth every 6 (six) hours as needed.    . irbesartan (AVAPRO) 300 MG tablet Take 1 tablet (300 mg total) by mouth daily. (Patient not taking: Reported on 05/15/2016) 90 tablet 3  . phentermine 37.5 MG capsule take 1 capsule by mouth every morning (Patient not taking: Reported on  05/12/2016) 30 capsule 2  . promethazine (PHENERGAN) 25 MG tablet Take 1 tablet (25 mg total) by mouth every 8 (eight) hours as needed for nausea or vomiting. (Patient not taking: Reported on 05/15/2016) 15 tablet 0   No facility-administered medications prior to visit.      EXAM:  BP (!) 144/96 (BP Location: Left Arm, Patient Position: Sitting, Cuff Size: Normal)   Temp 98.7 F (37.1 C) (Oral)   Wt 168 lb (76.2 kg)   LMP 05/04/2016 (Approximate)   BMI 27.96 kg/m   Body mass index is 27.96 kg/m.  GENERAL: vitals reviewed and listed above, alert, oriented, appears well hydrated and in no acute distress nontoxic but feels mildly uncomfortable HEENT: atraumatic, conjunctiva  clear, no obvious abnormalities on inspection of external nose and ears OP : no lesion edema or exudate moist mucous membranes. NECK: no obvious masses on inspection palpation  LUNGS: clear to auscultation bilaterally, no wheezes, rales or rhonchi, good air movement CV: HRRR, no clubbing cyanosis or  peripheral edema nl cap refill no acute rashes. Abdomen soft without organomegaly guarding or rebound bowel sounds are present decreased MS: moves all extremities without noticeable focal  abnormality PSYCH: pleasant and cooperative, no obvious depression or anxiety Lab Results  Component Value Date   WBC 6.9 05/12/2016   HGB 15.3 (H) 05/12/2016   HCT 42.4 05/12/2016   PLT 287 05/12/2016   GLUCOSE 171 (H) 05/12/2016   CHOL 140 05/20/2015   TRIG 126.0 05/20/2015   HDL 37.80 (L) 05/20/2015   LDLCALC 77 05/20/2015   ALT 17 05/12/2016   AST 30 05/12/2016   NA 135 05/12/2016   K 2.8 (L) 05/12/2016   CL 96 (L) 05/12/2016   CREATININE 0.77 05/12/2016   BUN 6 05/12/2016   CO2 23 05/12/2016   TSH 0.61 05/20/2015   INR 0.9 05/20/2011   HGBA1C 5.6 05/20/2015   Wt Readings from Last 3 Encounters:  05/15/16 168 lb (76.2 kg)  05/12/16 180 lb (81.6 kg)  01/13/16 171 lb 3.2 oz (77.7 kg)   BP Readings from Last 3  Encounters:  05/15/16 (!) 144/96  05/12/16 163/99  01/13/16 113/75    ASSESSMENT AND PLAN:  Discussed the following assessment and plan:  Hypokalemia - Plan: Basic metabolic panel, Magnesium, Hemoglobin A1c  Hyperglycemia - Plan: Basic metabolic panel, Magnesium, Hemoglobin A1c  Headache, unspecified headache type - Plan: Basic metabolic panel, Magnesium, Hemoglobin A1c  Nausea - Plan: Basic metabolic panel, Magnesium, Hemoglobin A1c  Essential hypertension Sp acute vomiting  Dehydration  Illness.   With hypokalemia and elevated lactic acid.  Residual headache probably related to illness Hypertension not  controlled today but has been off medicine should be followed up. Note for work. Expectant management. -Patient advised to return or notify health care team  if symptoms worsen ,persist or new concerns arise.  Patient Instructions  Checking blood work which was abnormal in the emergency room to include a sodium level of potassium level and a blood sugar level. We will also check an A1c test.  Take it easy over the weekend maintain hydration but avoid a lot of sugar drinks. Also check your blood pressure readings at home if they are not at goal which would be 130/80 or thereabouts 1 she to follow-up with Dr. Ronnald Ramp or send him a message. Ibuprofen is okay for pain but it can interfere with kidney function and get you stomach symptoms. And can elevate your blood pressure in that way.     Standley Brooking. Panosh M.D.

## 2016-06-22 ENCOUNTER — Other Ambulatory Visit: Payer: Self-pay | Admitting: Internal Medicine

## 2016-06-22 DIAGNOSIS — I10 Essential (primary) hypertension: Secondary | ICD-10-CM

## 2016-06-22 DIAGNOSIS — F40243 Fear of flying: Secondary | ICD-10-CM

## 2016-07-10 ENCOUNTER — Ambulatory Visit (INDEPENDENT_AMBULATORY_CARE_PROVIDER_SITE_OTHER): Payer: BLUE CROSS/BLUE SHIELD | Admitting: Nurse Practitioner

## 2016-07-10 ENCOUNTER — Encounter: Payer: Self-pay | Admitting: Nurse Practitioner

## 2016-07-10 VITALS — BP 132/94 | HR 84 | Temp 98.7°F | Ht 65.0 in | Wt 173.0 lb

## 2016-07-10 DIAGNOSIS — J209 Acute bronchitis, unspecified: Secondary | ICD-10-CM | POA: Diagnosis not present

## 2016-07-10 DIAGNOSIS — J0140 Acute pansinusitis, unspecified: Secondary | ICD-10-CM

## 2016-07-10 MED ORDER — DM-GUAIFENESIN ER 30-600 MG PO TB12
1.0000 | ORAL_TABLET | Freq: Two times a day (BID) | ORAL | 0 refills | Status: DC | PRN
Start: 2016-07-10 — End: 2016-07-30

## 2016-07-10 MED ORDER — BENZONATATE 100 MG PO CAPS: 100.0000 mg | ORAL_CAPSULE | Freq: Three times a day (TID) | ORAL | 0 refills | Status: DC | PRN

## 2016-07-10 MED ORDER — FLUTICASONE PROPIONATE 50 MCG/ACT NA SUSP: 2.0000 | Freq: Every day | NASAL | 0 refills | Status: DC

## 2016-07-10 MED ORDER — DOXYCYCLINE HYCLATE 100 MG PO TABS: 100.0000 mg | ORAL_TABLET | Freq: Two times a day (BID) | ORAL | 0 refills | Status: AC

## 2016-07-10 MED ORDER — ALBUTEROL SULFATE HFA 108 (90 BASE) MCG/ACT IN AERS: 2.0000 | INHALATION_SPRAY | Freq: Four times a day (QID) | RESPIRATORY_TRACT | 2 refills | Status: DC | PRN

## 2016-07-10 MED ORDER — SALINE SPRAY 0.65 % NA SOLN: 1.0000 | NASAL | 0 refills | Status: DC | PRN

## 2016-07-10 MED ORDER — METHYLPREDNISOLONE ACETATE 80 MG/ML IJ SUSP
80.0000 mg | Freq: Once | INTRAMUSCULAR | Status: AC
  Administered 2016-07-10: 80 mg via INTRAMUSCULAR

## 2016-07-10 NOTE — Patient Instructions (Signed)

## 2016-07-10 NOTE — Progress Notes (Signed)
Subjective:  Patient ID: Stacey Ruiz, female    DOB: [birth year]  Age: 41 y.o. MRN: [id number]  CC: Cough (bad cough,congestion for 1 week after got back from [place]. )  Cough  This is a new problem. The current episode started in the past 7 days. The problem has been gradually worsening. The problem occurs constantly. The cough is productive of purulent sputum. Associated symptoms include chest pain, chills, ear congestion, ear pain, headaches, nasal congestion, postnasal drip, rhinorrhea, shortness of breath and wheezing. Pertinent negatives include no fever, heartburn, hemoptysis or myalgias. The symptoms are aggravated by lying down and cold air. Risk factors for lung disease include travel. She has tried OTC cough suppressant for the symptoms. The treatment provided no relief.   Traveled to [place].  Outpatient Medications Prior to Visit  Medication Sig Dispense Refill  . amLODipine (NORVASC) 5 MG tablet take 1 tablet by mouth once daily 90 tablet 3  . ibuprofen (ADVIL,MOTRIN) 200 MG tablet Take 800 mg by mouth every 6 (six) hours as needed.    . irbesartan (AVAPRO) 300 MG tablet take 1 tablet by mouth once daily 90 tablet 1  . cetirizine-pseudoephedrine (ZYRTEC-D ALLERGY & CONGESTION) 5-120 MG tablet Take 1 tablet by mouth 2 (two) times daily. (Patient not taking: Reported on 05/12/2016) 60 tablet 3  . diazepam (VALIUM) 5 MG tablet take 1 tablet by mouth every 12 hours if needed for anxiety (Patient not taking: Reported on 07/10/2016) 25 tablet 1  . phentermine 37.5 MG capsule take 1 capsule by mouth every morning (Patient not taking: Reported on 07/10/2016) 30 capsule 2  . promethazine (PHENERGAN) 25 MG tablet Take 1 tablet (25 mg total) by mouth every 8 (eight) hours as needed for nausea or vomiting. (Patient not taking: Reported on 05/15/2016) 15 tablet 0   No facility-administered medications prior to visit.     ROS See HPI  Objective:  BP (!) 132/94   Pulse 84   Temp 98.7 F  (37.1 C)   Ht 5\' 5"  (1.651 m)   Wt 173 lb (78.5 kg)   SpO2 99%   BMI 28.79 kg/m   BP Readings from Last 3 Encounters:  07/10/16 (!) 132/94  05/15/16 (!) 144/96  05/12/16 163/99    Wt Readings from Last 3 Encounters:  07/10/16 173 lb (78.5 kg)  05/15/16 168 lb (76.2 kg)  05/12/16 180 lb (81.6 kg)    Physical Exam  Constitutional: She is oriented to person, place, and time. No distress.  HENT:  Right Ear: Tympanic membrane, external ear and ear canal normal.  Left Ear: Tympanic membrane, external ear and ear canal normal.  Nose: Mucosal edema and rhinorrhea present. Right sinus exhibits maxillary sinus tenderness. Right sinus exhibits no frontal sinus tenderness. Left sinus exhibits maxillary sinus tenderness. Left sinus exhibits no frontal sinus tenderness.  Mouth/Throat: Uvula is midline. No trismus in the jaw. Posterior oropharyngeal erythema present. No oropharyngeal exudate.  Eyes: No scleral icterus.  Neck: Normal range of motion. Neck supple.  Cardiovascular: Normal rate and normal heart sounds.   Pulmonary/Chest: Effort normal and breath sounds normal. No respiratory distress. She has no wheezes. She has no rales.  Musculoskeletal: She exhibits no edema.  Lymphadenopathy:    She has cervical adenopathy.  Neurological: She is alert and oriented to person, place, and time.  Vitals reviewed.   Lab Results  Component Value Date   WBC 6.9 05/12/2016   HGB 15.3 (H) 05/12/2016   HCT 42.4 05/12/2016  PLT 287 05/12/2016   GLUCOSE 67 (L) 05/15/2016   CHOL 140 05/20/2015   TRIG 126.0 05/20/2015   HDL 37.80 (L) 05/20/2015   LDLCALC 77 05/20/2015   ALT 17 05/12/2016   AST 30 05/12/2016   NA 140 05/15/2016   K 3.5 05/15/2016   CL 102 05/15/2016   CREATININE 0.85 05/15/2016   BUN 4 (L) 05/15/2016   CO2 34 (H) 05/15/2016   TSH 0.61 05/20/2015   INR 0.9 05/20/2011   HGBA1C 5.2 05/15/2016    No results found.  Assessment & Plan:   Tyhesha was seen today for  cough.  Diagnoses and all orders for this visit:  Acute bronchitis, unspecified organism -     methylPREDNISolone acetate (DEPO-MEDROL) injection 80 mg; Inject 1 mL (80 mg total) into the muscle once. -     dextromethorphan-guaiFENesin (MUCINEX DM) 30-600 MG 12hr tablet; Take 1 tablet by mouth 2 (two) times daily as needed for cough. -     fluticasone (FLONASE) 50 MCG/ACT nasal spray; Place 2 sprays into both nostrils daily. -     sodium chloride (OCEAN) 0.65 % SOLN nasal spray; Place 1 spray into both nostrils as needed for congestion. -     benzonatate (TESSALON) 100 MG capsule; Take 1 capsule (100 mg total) by mouth 3 (three) times daily as needed for cough. -     doxycycline (VIBRA-TABS) 100 MG tablet; Take 1 tablet (100 mg total) by mouth 2 (two) times daily. -     albuterol (PROVENTIL HFA;VENTOLIN HFA) 108 (90 Base) MCG/ACT inhaler; Inhale 2 puffs into the lungs every 6 (six) hours as needed for wheezing or shortness of breath.  Acute non-recurrent pansinusitis -     methylPREDNISolone acetate (DEPO-MEDROL) injection 80 mg; Inject 1 mL (80 mg total) into the muscle once. -     dextromethorphan-guaiFENesin (MUCINEX DM) 30-600 MG 12hr tablet; Take 1 tablet by mouth 2 (two) times daily as needed for cough. -     fluticasone (FLONASE) 50 MCG/ACT nasal spray; Place 2 sprays into both nostrils daily. -     sodium chloride (OCEAN) 0.65 % SOLN nasal spray; Place 1 spray into both nostrils as needed for congestion. -     benzonatate (TESSALON) 100 MG capsule; Take 1 capsule (100 mg total) by mouth 3 (three) times daily as needed for cough. -     doxycycline (VIBRA-TABS) 100 MG tablet; Take 1 tablet (100 mg total) by mouth 2 (two) times daily. -     albuterol (PROVENTIL HFA;VENTOLIN HFA) 108 (90 Base) MCG/ACT inhaler; Inhale 2 puffs into the lungs every 6 (six) hours as needed for wheezing or shortness of breath.   I have discontinued Ms. Dingley's cetirizine-pseudoephedrine, phentermine,  promethazine, and diazepam. I am also having her start on dextromethorphan-guaiFENesin, fluticasone, sodium chloride, benzonatate, doxycycline, and albuterol. Additionally, I am having her maintain her ibuprofen, irbesartan, amLODipine, HYDROcodone-acetaminophen, and DUEXIS. We will continue to administer methylPREDNISolone acetate.  Meds ordered this encounter  Medications  . HYDROcodone-acetaminophen (NORCO/VICODIN) 5-325 MG tablet    Sig: TAKE 1 TABLET BY MOUTH EVERY 4-6 HOURS AS NEEDED FOR PAPIN    Refill:  0  . DUEXIS 800-26.6 MG TABS    Sig: Take 1 tablet(s) by mouth every 8 hours as needed    Refill:  1  . methylPREDNISolone acetate (DEPO-MEDROL) injection 80 mg  . dextromethorphan-guaiFENesin (MUCINEX DM) 30-600 MG 12hr tablet    Sig: Take 1 tablet by mouth 2 (two) times daily as needed for  cough.    Dispense:  14 tablet    Refill:  0    Order Specific Question:   Supervising Provider    Answer:   Cassandria Anger [[id number]]  . fluticasone (FLONASE) 50 MCG/ACT nasal spray    Sig: Place 2 sprays into both nostrils daily.    Dispense:  16 g    Refill:  0    Order Specific Question:   Supervising Provider    Answer:   Cassandria Anger [[id number]]  . sodium chloride (OCEAN) 0.65 % SOLN nasal spray    Sig: Place 1 spray into both nostrils as needed for congestion.    Dispense:  15 mL    Refill:  0    Order Specific Question:   Supervising Provider    Answer:   Cassandria Anger [[id number]]  . benzonatate (TESSALON) 100 MG capsule    Sig: Take 1 capsule (100 mg total) by mouth 3 (three) times daily as needed for cough.    Dispense:  20 capsule    Refill:  0    Order Specific Question:   Supervising Provider    Answer:   Cassandria Anger [[id number]]  . doxycycline (VIBRA-TABS) 100 MG tablet    Sig: Take 1 tablet (100 mg total) by mouth 2 (two) times daily.    Dispense:  14 tablet    Refill:  0    Order Specific Question:   Supervising Provider    Answer:   Cassandria Anger  [[id number]]  . albuterol (PROVENTIL HFA;VENTOLIN HFA) 108 (90 Base) MCG/ACT inhaler    Sig: Inhale 2 puffs into the lungs every 6 (six) hours as needed for wheezing or shortness of breath.    Dispense:  1 Inhaler    Refill:  2    Order Specific Question:   Supervising Provider    Answer:   Cassandria Anger [[id number]]    Follow-up: Return if symptoms worsen or fail to improve.  Wilfred Lacy, NP

## 2016-07-10 NOTE — Progress Notes (Signed)
Pre visit review using our clinic review tool, if applicable. No additional management support is needed unless otherwise documented below in the visit note. 

## 2016-07-30 ENCOUNTER — Ambulatory Visit (INDEPENDENT_AMBULATORY_CARE_PROVIDER_SITE_OTHER): Payer: BLUE CROSS/BLUE SHIELD | Admitting: Internal Medicine

## 2016-07-30 ENCOUNTER — Encounter: Payer: Self-pay | Admitting: Internal Medicine

## 2016-07-30 VITALS — BP 124/74 | HR 64 | Temp 98.9°F | Resp 16 | Ht 65.0 in | Wt 173.5 lb

## 2016-07-30 DIAGNOSIS — I10 Essential (primary) hypertension: Secondary | ICD-10-CM

## 2016-07-30 DIAGNOSIS — L739 Follicular disorder, unspecified: Secondary | ICD-10-CM | POA: Insufficient documentation

## 2016-07-30 DIAGNOSIS — E669 Obesity, unspecified: Secondary | ICD-10-CM

## 2016-07-30 MED ORDER — PHENTERMINE HCL 37.5 MG PO CAPS: 37.5000 mg | ORAL_CAPSULE | ORAL | 2 refills | Status: DC

## 2016-07-30 NOTE — Progress Notes (Signed)
Pre visit review using our clinic review tool, if applicable. No additional management support is needed unless otherwise documented below in the visit note. 

## 2016-07-30 NOTE — Patient Instructions (Signed)
Folliculitis Folliculitis is inflammation of the hair follicles. Folliculitis most commonly occurs on the scalp, thighs, legs, back, and buttocks. However, it can occur anywhere on the body. What are the causes? This condition may be caused by:  A bacterial infection (common).  A fungal infection.  A viral infection.  Coming into contact with certain chemicals, especially oils and tars.  Shaving or waxing.  Applying greasy ointments or creams to your skin often. Long-lasting folliculitis and folliculitis that keeps coming back can be caused by bacteria that live in the nostrils. What increases the risk? This condition is more likely to develop in people with:  A weakened immune system.  Diabetes.  Obesity. What are the signs or symptoms? Symptoms of this condition include:  Redness.  Soreness.  Swelling.  Itching.  Small white or yellow, pus-filled, itchy spots (pustules) that appear over a reddened area. If there is an infection that goes deep into the follicle, these may develop into a boil (furuncle).  A group of closely packed boils (carbuncle). These tend to form in hairy, sweaty areas of the body. How is this diagnosed? This condition is diagnosed with a skin exam. To find what is causing the condition, your health care provider may take a sample of one of the pustules or boils for testing. How is this treated? This condition may be treated by:  Applying warm compresses to the affected areas.  Taking an antibiotic medicine or applying an antibiotic medicine to the skin.  Applying or bathing with an antiseptic solution.  Taking an over-the-counter medicine to help with itching.  Having a procedure to drain any pustules or boils. This may be done if a pustule or boil contains a lot of pus or fluid.  Laser hair removal. This may be done to treat long-lasting folliculitis. Follow these instructions at home:  If directed, apply heat to the affected area as  often as told by your health care provider. Use the heat source that your health care provider recommends, such as a moist heat pack or a heating pad.  Place a towel between your skin and the heat source.  Leave the heat on for 20-30 minutes.  Remove the heat if your skin turns bright red. This is especially important if you are unable to feel pain, heat, or cold. You may have a greater risk of getting burned.  If you were prescribed an antibiotic medicine, use it as told by your health care provider. Do not stop using the antibiotic even if you start to feel better.  Take over-the-counter and prescription medicines only as told by your health care provider.  Do not shave irritated skin.  Keep all follow-up visits as told by your health care provider. This is important. Get help right away if:  You have more redness, swelling, or pain in the affected area.  Red streaks are spreading from the affected area.  You have a fever. This information is not intended to replace advice given to you by your health care provider. Make sure you discuss any questions you have with your health care provider. Document Released: 06/01/2001 Document Revised: 10/11/2015 Document Reviewed: 01/11/2015 Elsevier Interactive Patient Education  2017 [employer]  

## 2016-07-30 NOTE — Progress Notes (Signed)
Subjective:  Patient ID: Stacey Ruiz, female    DOB: [birth year]  Age: 41 y.o. MRN: [id number]  CC: Rash   HPI Reneisha KALIANNE FETTING presents for a rash on her left buttocks. She complains that 4 days ago she developed several itchy pimples in the area and she has been treating it with topical Bactroban. She says the pimples have resolved and the area does not bother her anymore. She also wants to restart phentermine to help her lose weight. She offers no other complaints today.  Outpatient Medications Prior to Visit  Medication Sig Dispense Refill  . albuterol (PROVENTIL HFA;VENTOLIN HFA) 108 (90 Base) MCG/ACT inhaler Inhale 2 puffs into the lungs every 6 (six) hours as needed for wheezing or shortness of breath. 1 Inhaler 2  . amLODipine (NORVASC) 5 MG tablet take 1 tablet by mouth once daily 90 tablet 3  . DUEXIS 800-26.6 MG TABS Take 1 tablet(s) by mouth every 8 hours as needed  1  . fluticasone (FLONASE) 50 MCG/ACT nasal spray Place 2 sprays into both nostrils daily. 16 g 0  . HYDROcodone-acetaminophen (NORCO/VICODIN) 5-325 MG tablet TAKE 1 TABLET BY MOUTH EVERY 4-6 HOURS AS NEEDED FOR PAPIN  0  . irbesartan (AVAPRO) 300 MG tablet take 1 tablet by mouth once daily 90 tablet 1  . sodium chloride (OCEAN) 0.65 % SOLN nasal spray Place 1 spray into both nostrils as needed for congestion. 15 mL 0  . benzonatate (TESSALON) 100 MG capsule Take 1 capsule (100 mg total) by mouth 3 (three) times daily as needed for cough. 20 capsule 0  . dextromethorphan-guaiFENesin (MUCINEX DM) 30-600 MG 12hr tablet Take 1 tablet by mouth 2 (two) times daily as needed for cough. 14 tablet 0  . ibuprofen (ADVIL,MOTRIN) 200 MG tablet Take 800 mg by mouth every 6 (six) hours as needed.     No facility-administered medications prior to visit.     ROS Review of Systems  Constitutional: Negative for chills and fever.  HENT: Negative.  Negative for trouble swallowing.   Respiratory: Negative.  Negative for cough,  chest tightness and stridor.   Cardiovascular: Negative for chest pain, palpitations and leg swelling.  Gastrointestinal: Negative for abdominal pain and anal bleeding.  Endocrine: Negative.   Genitourinary: Negative.   Musculoskeletal: Negative.  Negative for back pain and neck pain.  Skin: Positive for rash.  Allergic/Immunologic: Negative.   Neurological: Negative.   Hematological: Negative for adenopathy. Does not bruise/bleed easily.  Psychiatric/Behavioral: Negative.   All other systems reviewed and are negative.   Objective:  BP 124/74 (BP Location: Left Arm, Patient Position: Sitting, Cuff Size: Normal)   Pulse 64   Temp 98.9 F (37.2 C) (Oral)   Resp 16   Ht 5\' 5"  (1.651 m)   Wt 173 lb 8 oz (78.7 kg)   SpO2 100%   BMI 28.87 kg/m   BP Readings from Last 3 Encounters:  07/30/16 124/74  07/10/16 (!) 132/94  05/15/16 (!) 144/96    Wt Readings from Last 3 Encounters:  07/30/16 173 lb 8 oz (78.7 kg)  07/10/16 173 lb (78.5 kg)  05/15/16 168 lb (76.2 kg)    Physical Exam  Constitutional: She is oriented to person, place, and time. No distress.  HENT:  Mouth/Throat: Oropharynx is clear and moist. No oropharyngeal exudate.  Eyes: Conjunctivae are normal. Right eye exhibits no discharge. Left eye exhibits no discharge. No scleral icterus.  Neck: Normal range of motion. Neck supple. No JVD present. No  tracheal deviation present. No thyromegaly present.  Cardiovascular: Normal rate, regular rhythm, normal heart sounds and intact distal pulses.  Exam reveals no gallop and no friction rub.   No murmur heard. Pulmonary/Chest: Effort normal and breath sounds normal. No stridor. No respiratory distress. She has no wheezes. She has no rales. She exhibits no tenderness.  Abdominal: Soft. Bowel sounds are normal. She exhibits no distension and no mass. There is no tenderness. There is no rebound and no guarding.  Musculoskeletal: Normal range of motion. She exhibits no edema,  tenderness or deformity.  Lymphadenopathy:    She has no cervical adenopathy.  Neurological: She is oriented to person, place, and time.  Skin: Lesion and rash noted. No purpura noted. Rash is not macular, not papular, not maculopapular, not nodular, not pustular, not vesicular and not urticarial. She is not diaphoretic. No erythema.  Over the left mid buttocks there is a round accumulation of excoriated lesions that have scabbed over.  Vitals reviewed.   Lab Results  Component Value Date   WBC 6.9 05/12/2016   HGB 15.3 (H) 05/12/2016   HCT 42.4 05/12/2016   PLT 287 05/12/2016   GLUCOSE 67 (L) 05/15/2016   CHOL 140 05/20/2015   TRIG 126.0 05/20/2015   HDL 37.80 (L) 05/20/2015   LDLCALC 77 05/20/2015   ALT 17 05/12/2016   AST 30 05/12/2016   NA 140 05/15/2016   K 3.5 05/15/2016   CL 102 05/15/2016   CREATININE 0.85 05/15/2016   BUN 4 (L) 05/15/2016   CO2 34 (H) 05/15/2016   TSH 0.61 05/20/2015   INR 0.9 05/20/2011   HGBA1C 5.2 05/15/2016    No results found.  Assessment & Plan:   Franca was seen today for rash.  Diagnoses and all orders for this visit:  Obesity (BMI 30.0-34.9)- in addition to lifestyle modifications she will take phentermine to reduce her caloric intake and help her lose weight -     phentermine 37.5 MG capsule; Take 1 capsule (37.5 mg total) by mouth every morning.  Acute folliculitis- she has had episode of bacterial folliculitis that has responded well to Bactroban, will continue.  Essential hypertension, benign- her blood pressure is adequately well controlled.   I have discontinued Ms. Zenk's ibuprofen, dextromethorphan-guaiFENesin, and benzonatate. I am also having her start on phentermine. Additionally, I am having her maintain her irbesartan, amLODipine, HYDROcodone-acetaminophen, DUEXIS, fluticasone, sodium chloride, and albuterol.  Meds ordered this encounter  Medications  . phentermine 37.5 MG capsule    Sig: Take 1 capsule (37.5 mg  total) by mouth every morning.    Dispense:  30 capsule    Refill:  2     Follow-up: Return in about 3 months (around 10/29/2016).  Scarlette Calico, MD

## 2017-01-12 ENCOUNTER — Other Ambulatory Visit: Payer: Self-pay | Admitting: Internal Medicine

## 2017-01-13 NOTE — Telephone Encounter (Signed)
Can you advise in PCP absence.  

## 2017-01-13 NOTE — Telephone Encounter (Signed)
rx faxed to [facility].  

## 2017-01-13 NOTE — Telephone Encounter (Signed)
[place] controlled substance database checked.  Ok to fill medication. rx printed 

## 2017-03-10 DIAGNOSIS — M2241 Chondromalacia patellae, right knee: Secondary | ICD-10-CM | POA: Diagnosis not present

## 2017-05-10 ENCOUNTER — Other Ambulatory Visit: Payer: Self-pay | Admitting: Internal Medicine

## 2017-05-10 DIAGNOSIS — Z1231 Encounter for screening mammogram for malignant neoplasm of breast: Secondary | ICD-10-CM

## 2017-05-17 ENCOUNTER — Encounter: Payer: Self-pay | Admitting: Nurse Practitioner

## 2017-05-17 ENCOUNTER — Ambulatory Visit: Payer: BLUE CROSS/BLUE SHIELD | Admitting: Nurse Practitioner

## 2017-05-17 VITALS — BP 134/9 | HR 64 | Temp 98.0°F | Ht 65.0 in | Wt 176.0 lb

## 2017-05-17 DIAGNOSIS — Z20828 Contact with and (suspected) exposure to other viral communicable diseases: Secondary | ICD-10-CM

## 2017-05-17 DIAGNOSIS — J00 Acute nasopharyngitis [common cold]: Secondary | ICD-10-CM

## 2017-05-17 MED ORDER — OSELTAMIVIR PHOSPHATE 75 MG PO CAPS: 75.0000 mg | ORAL_CAPSULE | Freq: Two times a day (BID) | ORAL | 0 refills | Status: DC

## 2017-05-17 MED ORDER — IPRATROPIUM BROMIDE 0.03 % NA SOLN: 2.0000 | Freq: Two times a day (BID) | NASAL | 0 refills | Status: DC

## 2017-05-17 MED ORDER — BENZONATATE 100 MG PO CAPS: 100.0000 mg | ORAL_CAPSULE | Freq: Three times a day (TID) | ORAL | 0 refills | Status: DC | PRN

## 2017-05-17 MED ORDER — METHYLPREDNISOLONE ACETATE 40 MG/ML IJ SUSP
40.0000 mg | Freq: Once | INTRAMUSCULAR | Status: AC
  Administered 2017-05-17: 40 mg via INTRAMUSCULAR

## 2017-05-17 MED ORDER — DM-GUAIFENESIN ER 30-600 MG PO TB12: 1.0000 | ORAL_TABLET | Freq: Two times a day (BID) | ORAL | 0 refills | Status: DC | PRN

## 2017-05-17 NOTE — Patient Instructions (Signed)

## 2017-05-17 NOTE — Progress Notes (Signed)
Subjective:  Patient ID: Stacey Ruiz, female    DOB: [birth year]  Age: 42 y.o. MRN: [id number]  CC: Cough (son dx + flu/ couging,itchy throat,headache,L ear ringing,runny nose/1 day/ going on a plan tomorrow for work/need to know how to prevent/req note stating from get worse she is safe to fly in writting/havent take her med today)  URI   This is a new problem. The current episode started yesterday. The problem has been unchanged. The maximum temperature recorded prior to her arrival was 100.4 - 100.9 F. Associated symptoms include congestion, coughing, joint pain, rhinorrhea, sinus pain, sneezing and a sore throat. Pertinent negatives include no abdominal pain, chest pain, diarrhea, dysuria, ear pain, headaches, joint swelling, nausea, neck pain, plugged ear sensation, rash, swollen glands, vomiting or wheezing. She has tried nothing for the symptoms.    Outpatient Medications Prior to Visit  Medication Sig Dispense Refill  . albuterol (PROVENTIL HFA;VENTOLIN HFA) 108 (90 Base) MCG/ACT inhaler Inhale 2 puffs into the lungs every 6 (six) hours as needed for wheezing or shortness of breath. 1 Inhaler 2  . amLODipine (NORVASC) 5 MG tablet take 1 tablet by mouth once daily 90 tablet 3  . diazepam (VALIUM) 5 MG tablet take 1 tablet by mouth every 12 hours if needed for anxiety 25 tablet 0  . DUEXIS 800-26.6 MG TABS Take 1 tablet(s) by mouth every 8 hours as needed  1  . fluticasone (FLONASE) 50 MCG/ACT nasal spray Place 2 sprays into both nostrils daily. 16 g 0  . HYDROcodone-acetaminophen (NORCO/VICODIN) 5-325 MG tablet TAKE 1 TABLET BY MOUTH EVERY 4-6 HOURS AS NEEDED FOR PAPIN  0  . irbesartan (AVAPRO) 300 MG tablet take 1 tablet by mouth once daily 90 tablet 1  . phentermine 37.5 MG capsule Take 1 capsule (37.5 mg total) by mouth every morning. 30 capsule 2  . sodium chloride (OCEAN) 0.65 % SOLN nasal spray Place 1 spray into both nostrils as needed for congestion. 15 mL 0   No  facility-administered medications prior to visit.     ROS See HPI  Objective:  BP (!) 134/9   Pulse 64   Temp 98 F (36.7 C)   Ht 5\' 5"  (1.651 m)   Wt 176 lb (79.8 kg)   SpO2 98%   BMI 29.29 kg/m   BP Readings from Last 3 Encounters:  05/17/17 (!) 134/9  07/30/16 124/74  07/10/16 (!) 132/94    Wt Readings from Last 3 Encounters:  05/17/17 176 lb (79.8 kg)  07/30/16 173 lb 8 oz (78.7 kg)  07/10/16 173 lb (78.5 kg)    Physical Exam  Constitutional: She is oriented to person, place, and time. No distress.  HENT:  Right Ear: External ear and ear canal normal. Tympanic membrane is not injected and not erythematous. A middle ear effusion is present.  Left Ear: Tympanic membrane, external ear and ear canal normal. Tympanic membrane is not injected and not erythematous.  No middle ear effusion.  Nose: Mucosal edema and rhinorrhea present. Right sinus exhibits maxillary sinus tenderness. Right sinus exhibits no frontal sinus tenderness. Left sinus exhibits maxillary sinus tenderness. Left sinus exhibits no frontal sinus tenderness.  Mouth/Throat: Uvula is midline. No trismus in the jaw. Posterior oropharyngeal erythema present. No oropharyngeal exudate.  Eyes: No scleral icterus.  Neck: Normal range of motion. Neck supple.  Cardiovascular: Normal rate and normal heart sounds.  Pulmonary/Chest: Effort normal and breath sounds normal.  Musculoskeletal: She exhibits no edema.  Lymphadenopathy:  She has no cervical adenopathy.  Neurological: She is alert and oriented to person, place, and time.  Vitals reviewed.   Lab Results  Component Value Date   WBC 6.9 05/12/2016   HGB 15.3 (H) 05/12/2016   HCT 42.4 05/12/2016   PLT 287 05/12/2016   GLUCOSE 67 (L) 05/15/2016   CHOL 140 05/20/2015   TRIG 126.0 05/20/2015   HDL 37.80 (L) 05/20/2015   LDLCALC 77 05/20/2015   ALT 17 05/12/2016   AST 30 05/12/2016   NA 140 05/15/2016   K 3.5 05/15/2016   CL 102 05/15/2016    CREATININE 0.85 05/15/2016   BUN 4 (L) 05/15/2016   CO2 34 (H) 05/15/2016   TSH 0.61 05/20/2015   INR 0.9 05/20/2011   HGBA1C 5.2 05/15/2016    No results found.  Assessment & Plan:   Jasslyn was seen today for cough.  Diagnoses and all orders for this visit:  Exposure to influenza -     methylPREDNISolone acetate (DEPO-MEDROL) injection 40 mg -     dextromethorphan-guaiFENesin (MUCINEX DM) 30-600 MG 12hr tablet; Take 1 tablet by mouth 2 (two) times daily as needed for cough. -     ipratropium (ATROVENT) 0.03 % nasal spray; Place 2 sprays into both nostrils 2 (two) times daily. Do not use for more than 5days. -     benzonatate (TESSALON) 100 MG capsule; Take 1 capsule (100 mg total) by mouth 3 (three) times daily as needed for cough. -     oseltamivir (TAMIFLU) 75 MG capsule; Take 1 capsule (75 mg total) by mouth 2 (two) times daily.  Acute nasopharyngitis -     methylPREDNISolone acetate (DEPO-MEDROL) injection 40 mg -     dextromethorphan-guaiFENesin (MUCINEX DM) 30-600 MG 12hr tablet; Take 1 tablet by mouth 2 (two) times daily as needed for cough. -     ipratropium (ATROVENT) 0.03 % nasal spray; Place 2 sprays into both nostrils 2 (two) times daily. Do not use for more than 5days. -     benzonatate (TESSALON) 100 MG capsule; Take 1 capsule (100 mg total) by mouth 3 (three) times daily as needed for cough. -     oseltamivir (TAMIFLU) 75 MG capsule; Take 1 capsule (75 mg total) by mouth 2 (two) times daily.   I am having Stacey Ruiz start on dextromethorphan-guaiFENesin, ipratropium, benzonatate, and oseltamivir. I am also having her maintain her irbesartan, amLODipine, HYDROcodone-acetaminophen, DUEXIS, fluticasone, sodium chloride, albuterol, phentermine, and diazepam. We administered methylPREDNISolone acetate.  Meds ordered this encounter  Medications  . methylPREDNISolone acetate (DEPO-MEDROL) injection 40 mg  . dextromethorphan-guaiFENesin (MUCINEX DM) 30-600 MG 12hr  tablet    Sig: Take 1 tablet by mouth 2 (two) times daily as needed for cough.    Dispense:  14 tablet    Refill:  0    Order Specific Question:   Supervising Provider    Answer:   Lucille Passy [[id number]]  . ipratropium (ATROVENT) 0.03 % nasal spray    Sig: Place 2 sprays into both nostrils 2 (two) times daily. Do not use for more than 5days.    Dispense:  30 mL    Refill:  0    Order Specific Question:   Supervising Provider    Answer:   Lucille Passy [[id number]]  . benzonatate (TESSALON) 100 MG capsule    Sig: Take 1 capsule (100 mg total) by mouth 3 (three) times daily as needed for cough.    Dispense:  20 capsule  Refill:  0    Order Specific Question:   Supervising Provider    Answer:   Lucille Passy [[id number]]  . oseltamivir (TAMIFLU) 75 MG capsule    Sig: Take 1 capsule (75 mg total) by mouth 2 (two) times daily.    Dispense:  10 capsule    Refill:  0    Order Specific Question:   Supervising Provider    Answer:   Lucille Passy [[id number]]    Follow-up: No Follow-up on file.  Wilfred Lacy, NP

## 2017-05-26 ENCOUNTER — Ambulatory Visit
Admission: RE | Admit: 2017-05-26 | Discharge: 2017-05-26 | Disposition: A | Discharge status: Home / Self Care | Payer: BLUE CROSS/BLUE SHIELD | Source: Ambulatory Visit | Attending: Internal Medicine | Admitting: Internal Medicine

## 2017-05-26 DIAGNOSIS — Z1231 Encounter for screening mammogram for malignant neoplasm of breast: Secondary | ICD-10-CM | POA: Diagnosis not present

## 2017-05-27 LAB — HM MAMMOGRAPHY

## 2017-07-15 ENCOUNTER — Ambulatory Visit: Payer: BLUE CROSS/BLUE SHIELD | Admitting: Internal Medicine

## 2017-07-15 ENCOUNTER — Encounter: Payer: Self-pay | Admitting: Internal Medicine

## 2017-07-15 VITALS — BP 134/80 | HR 62 | Temp 98.6°F | Resp 14 | Ht 65.0 in | Wt 180.0 lb

## 2017-07-15 DIAGNOSIS — Z0000 Encounter for general adult medical examination without abnormal findings: Secondary | ICD-10-CM

## 2017-07-15 DIAGNOSIS — I10 Essential (primary) hypertension: Secondary | ICD-10-CM | POA: Diagnosis not present

## 2017-07-15 DIAGNOSIS — R519 Headache, unspecified: Secondary | ICD-10-CM

## 2017-07-15 DIAGNOSIS — N951 Menopausal and female climacteric states: Secondary | ICD-10-CM | POA: Diagnosis not present

## 2017-07-15 DIAGNOSIS — R51 Headache: Secondary | ICD-10-CM | POA: Diagnosis not present

## 2017-07-15 DIAGNOSIS — R270 Ataxia, unspecified: Secondary | ICD-10-CM | POA: Insufficient documentation

## 2017-07-15 NOTE — Progress Notes (Signed)
Subjective:  Patient ID: [name], female    DOB: Oct 30, 1975  Age: 42 y.o. MRN: [id number]  CC: Annual Exam and Hypertension   HPI Annice FERMINA MISHKIN presents for a CPX.  She tells me her blood pressure has been well controlled despite the fact that she is not taking any antihypertensives.  Over the last month she complains of intermittent episodes of ataxia and feeling off balance when she steps down with her right lower extremity.  She has recurrent episodes of dizziness and ataxia and intermittent numbness in her left upper and lower extremity.  She has had a few episodes of nausea but she denies vomiting.  She also complains of hot flashes at night over the last few months.  Her menstrual cycles recently have been irregular.  She is concerned she is going through menopause.  She has a lot of emotional symptoms as well feeling like she is overworked and stressed out in her workplace.  She has had anhedonia and crying spells.  She does not want to take an antidepressant.  Outpatient Medications Prior to Visit  Medication Sig Dispense Refill  . amLODipine (NORVASC) 5 MG tablet take 1 tablet by mouth once daily 90 tablet 3  . diazepam (VALIUM) 5 MG tablet take 1 tablet by mouth every 12 hours if needed for anxiety (Patient not taking: Reported on 07/15/2017) 25 tablet 0  . albuterol (PROVENTIL HFA;VENTOLIN HFA) 108 (90 Base) MCG/ACT inhaler Inhale 2 puffs into the lungs every 6 (six) hours as needed for wheezing or shortness of breath. (Patient not taking: Reported on 07/15/2017) 1 Inhaler 2  . benzonatate (TESSALON) 100 MG capsule Take 1 capsule (100 mg total) by mouth 3 (three) times daily as needed for cough. (Patient not taking: Reported on 07/15/2017) 20 capsule 0  . dextromethorphan-guaiFENesin (MUCINEX DM) 30-600 MG 12hr tablet Take 1 tablet by mouth 2 (two) times daily as needed for cough. (Patient not taking: Reported on 07/15/2017) 14 tablet 0  . DUEXIS 800-26.6 MG TABS Take 1  tablet(s) by mouth every 8 hours as needed  1  . fluticasone (FLONASE) 50 MCG/ACT nasal spray Place 2 sprays into both nostrils daily. (Patient not taking: Reported on 07/15/2017) 16 g 0  . HYDROcodone-acetaminophen (NORCO/VICODIN) 5-325 MG tablet TAKE 1 TABLET BY MOUTH EVERY 4-6 HOURS AS NEEDED FOR PAPIN  0  . ipratropium (ATROVENT) 0.03 % nasal spray Place 2 sprays into both nostrils 2 (two) times daily. Do not use for more than 5days. (Patient not taking: Reported on 07/15/2017) 30 mL 0  . irbesartan (AVAPRO) 300 MG tablet take 1 tablet by mouth once daily (Patient not taking: Reported on 07/15/2017) 90 tablet 1  . oseltamivir (TAMIFLU) 75 MG capsule Take 1 capsule (75 mg total) by mouth 2 (two) times daily. (Patient not taking: Reported on 07/15/2017) 10 capsule 0  . phentermine 37.5 MG capsule Take 1 capsule (37.5 mg total) by mouth every morning. (Patient not taking: Reported on 07/15/2017) 30 capsule 2  . sodium chloride (OCEAN) 0.65 % SOLN nasal spray Place 1 spray into both nostrils as needed for congestion. (Patient not taking: Reported on 07/15/2017) 15 mL 0   No facility-administered medications prior to visit.     ROS Review of Systems  Constitutional: Negative.  Negative for appetite change, diaphoresis, fatigue and unexpected weight change.  HENT: Negative.   Eyes: Negative for visual disturbance.  Respiratory: Negative for cough, chest tightness, shortness of breath and wheezing.   Cardiovascular: Negative for  chest pain, palpitations and leg swelling.  Gastrointestinal: Positive for nausea. Negative for abdominal pain, constipation, diarrhea and vomiting.  Endocrine: Negative.   Genitourinary: Negative.  Negative for difficulty urinating.  Musculoskeletal: Positive for gait problem. Negative for arthralgias, back pain, myalgias and neck pain.  Skin: Negative.  Negative for color change, pallor and rash.  Neurological: Positive for dizziness and numbness. Negative for tremors,  seizures, syncope, facial asymmetry, speech difficulty, weakness, light-headedness and headaches.  Hematological: Negative for adenopathy. Does not bruise/bleed easily.  Psychiatric/Behavioral: Positive for dysphoric mood. Negative for agitation, behavioral problems, self-injury, sleep disturbance and suicidal ideas. The patient is not nervous/anxious.     Objective:  BP 134/80 (BP Location: Left Arm, Patient Position: Sitting, Cuff Size: Large)   Pulse 62   Temp 98.6 F (37 C) (Oral)   Resp 14   Ht 5\' 5"  (1.651 m)   Wt 180 lb (81.6 kg)   LMP 05/12/2017   SpO2 98%   BMI 29.95 kg/m   BP Readings from Last 3 Encounters:  07/15/17 134/80  05/17/17 (!) 134/9  07/30/16 124/74    Wt Readings from Last 3 Encounters:  07/15/17 180 lb (81.6 kg)  05/17/17 176 lb (79.8 kg)  07/30/16 173 lb 8 oz (78.7 kg)    Physical Exam  Constitutional: She is oriented to person, place, and time. She appears well-developed and well-nourished. No distress.  HENT:  Head: Normocephalic and atraumatic.  Mouth/Throat: No oropharyngeal exudate.  Eyes: Pupils are equal, round, and reactive to light. EOM are normal.  Neck: Normal range of motion. Neck supple. No JVD present. No thyromegaly present.  Cardiovascular: Normal rate, regular rhythm, normal heart sounds and intact distal pulses. Exam reveals no gallop and no friction rub.  No murmur heard. Pulmonary/Chest: Effort normal and breath sounds normal. No stridor. No respiratory distress. She has no wheezes. She has no rales.  Abdominal: Soft. Bowel sounds are normal. She exhibits no distension and no mass. There is no tenderness. There is no rebound and no guarding. No hernia.  Musculoskeletal: Normal range of motion. She exhibits no edema, tenderness or deformity.  Lymphadenopathy:    She has no cervical adenopathy.  Neurological: She is alert and oriented to person, place, and time. She has normal strength. She displays no atrophy, no tremor and  normal reflexes. No cranial nerve deficit or sensory deficit. She exhibits normal muscle tone. She displays a negative Romberg sign. She displays no seizure activity. Coordination and gait normal.  Reflex Scores:      Tricep reflexes are 1+ on the right side and 1+ on the left side.      Bicep reflexes are 1+ on the right side and 1+ on the left side.      Brachioradialis reflexes are 1+ on the right side and 1+ on the left side.      Patellar reflexes are 2+ on the right side and 2+ on the left side.      Achilles reflexes are 1+ on the right side and 1+ on the left side. Skin: Skin is warm and dry. No rash noted. She is not diaphoretic. No erythema. No pallor.  Psychiatric: Her behavior is normal. Judgment and thought content normal. Her mood appears anxious. Her speech is not rapid and/or pressured and not delayed. She is not agitated, not slowed and not withdrawn. Cognition and memory are normal. Cognition and memory are not impaired. She exhibits a depressed mood. She exhibits normal recent memory and normal remote memory.  ++  tearful  Vitals reviewed.   Lab Results  Component Value Date   WBC 6.9 05/12/2016   HGB 15.3 (H) 05/12/2016   HCT 42.4 05/12/2016   PLT 287 05/12/2016   GLUCOSE 67 (L) 05/15/2016   CHOL 140 05/20/2015   TRIG 126.0 05/20/2015   HDL 37.80 (L) 05/20/2015   LDLCALC 77 05/20/2015   ALT 17 05/12/2016   AST 30 05/12/2016   NA 140 05/15/2016   K 3.5 05/15/2016   CL 102 05/15/2016   CREATININE 0.85 05/15/2016   BUN 4 (L) 05/15/2016   CO2 34 (H) 05/15/2016   TSH 0.61 05/20/2015   INR 0.9 05/20/2011   HGBA1C 5.2 05/15/2016    Mm Screening Breast Tomo Bilateral  Result Date: 05/26/2017 CLINICAL DATA:  Screening. EXAM: DIGITAL SCREENING BILATERAL MAMMOGRAM WITH TOMO AND CAD COMPARISON:  Previous exam(s). ACR Breast Density Category b: There are scattered areas of fibroglandular density. FINDINGS: There are no findings suspicious for malignancy. Images were  processed with CAD. IMPRESSION: No mammographic evidence of malignancy. A result letter of this screening mammogram will be mailed directly to the patient. RECOMMENDATION: Screening mammogram in one year. (Code:SM-B-01Y) BI-RADS CATEGORY  1: Negative. Electronically Signed   By: Franki Cabot M.D.   On: 05/26/2017 14:11    Assessment & Plan:   Aloha was seen today for annual exam and hypertension.  Diagnoses and all orders for this visit:  Essential hypertension, benign- Her blood pressure is adequately well controlled.  I will check her labs for secondary causes and endorgan damage. -     Comprehensive metabolic panel; Future -     CBC with Differential/Platelet; Future -     TSH; Future -     Urinalysis, Routine w reflex microscopic; Future -     VITAMIN D 25 Hydroxy (Vit-D Deficiency, Fractures); Future  Routine general medical examination at a health care facility- Exam completed, labs ordered, she refused a flu vaccine, mammogram and Pap are up-to-date, patient education material was given. -     Lipid panel; Future  Ataxia- She has ataxia and other neurological symptoms.  I recommended that she undergo an MRI of the brain to screen for CVA and demyelinating lesions.  Will also screen her for B12 and folate deficiencies as well as vasculitis with a sed rate. -     Folate; Future -     Sedimentation rate; Future -     Vitamin B12; Future  Hot flashes, menopausal- I will check her lab work to screen for secondary causes.  Will get an [facility] and [facility] to see if she is having premature ovarian failure.  He is not willing to start an antidepressant. -     Follicle stimulating hormone; Future -     Luteinizing hormone; Future  Intractable episodic headache, unspecified headache type -     MR Brain Wo Contrast; Future   I have discontinued Charish H. Zeigler's irbesartan, HYDROcodone-acetaminophen, DUEXIS, fluticasone, sodium chloride, albuterol, phentermine, dextromethorphan-guaiFENesin,  ipratropium, benzonatate, and oseltamivir. I am also having her maintain her amLODipine and diazepam.  No orders of the defined types were placed in this encounter.    Follow-up: No follow-ups on file.  Scarlette Calico, MD

## 2017-07-16 ENCOUNTER — Encounter: Payer: Self-pay | Admitting: Internal Medicine

## 2017-07-16 DIAGNOSIS — N951 Menopausal and female climacteric states: Secondary | ICD-10-CM | POA: Insufficient documentation

## 2017-07-26 ENCOUNTER — Other Ambulatory Visit (INDEPENDENT_AMBULATORY_CARE_PROVIDER_SITE_OTHER): Payer: BLUE CROSS/BLUE SHIELD

## 2017-07-26 DIAGNOSIS — R270 Ataxia, unspecified: Secondary | ICD-10-CM | POA: Diagnosis not present

## 2017-07-26 DIAGNOSIS — I10 Essential (primary) hypertension: Secondary | ICD-10-CM

## 2017-07-26 DIAGNOSIS — Z0000 Encounter for general adult medical examination without abnormal findings: Secondary | ICD-10-CM

## 2017-07-26 DIAGNOSIS — N951 Menopausal and female climacteric states: Secondary | ICD-10-CM | POA: Diagnosis not present

## 2017-07-26 LAB — COMPREHENSIVE METABOLIC PANEL
ALT: 15 U/L (ref 0–35)
AST: 18 U/L (ref 0–37)
Albumin: 4.1 g/dL (ref 3.5–5.2)
Alkaline Phosphatase: 41 U/L (ref 39–117)
BUN: 10 mg/dL (ref 6–23)
CHLORIDE: 103 meq/L (ref 96–112)
CO2: 27 meq/L (ref 19–32)
CREATININE: 0.86 mg/dL (ref 0.40–1.20)
Calcium: 9.1 mg/dL (ref 8.4–10.5)
GFR: 92.53 mL/min (ref >60.00)
GLUCOSE: 100 mg/dL — AB (ref 70–99)
POTASSIUM: 3.7 meq/L (ref 3.5–5.1)
SODIUM: 138 meq/L (ref 135–145)
Total Bilirubin: 1.4 mg/dL — ABNORMAL HIGH (ref 0.2–1.2)
Total Protein: 7 g/dL (ref 6.0–8.3)

## 2017-07-26 LAB — URINALYSIS, ROUTINE W REFLEX MICROSCOPIC
Bilirubin Urine: NEGATIVE
Hgb urine dipstick: NEGATIVE
KETONES UR: NEGATIVE
Leukocytes, UA: NEGATIVE
Nitrite: NEGATIVE
PH: 5.5 (ref 5.0–8.0)
SPECIFIC GRAVITY, URINE: 1.015 (ref 1.000–1.030)
TOTAL PROTEIN, URINE-UPE24: NEGATIVE
UROBILINOGEN UA: 0.2 (ref 0.0–1.0)
Urine Glucose: NEGATIVE

## 2017-07-26 LAB — CBC WITH DIFFERENTIAL/PLATELET
BASOS PCT: 1.5 % (ref 0.0–3.0)
Basophils Absolute: 0.1 10*3/uL (ref 0.0–0.1)
EOS ABS: 0.1 10*3/uL (ref 0.0–0.7)
Eosinophils Relative: 1.9 % (ref 0.0–5.0)
HCT: 41.6 % (ref 36.0–46.0)
Hemoglobin: 14.3 g/dL (ref 12.0–15.0)
LYMPHS ABS: 1.8 10*3/uL (ref 0.7–4.0)
Lymphocytes Relative: 39.4 % (ref 12.0–46.0)
MCHC: 34.4 g/dL (ref 30.0–36.0)
MCV: 91.1 fl (ref 78.0–100.0)
MONO ABS: 0.4 10*3/uL (ref 0.1–1.0)
Monocytes Relative: 7.7 % (ref 3.0–12.0)
NEUTROS ABS: 2.3 10*3/uL (ref 1.4–7.7)
NEUTROS PCT: 49.5 % (ref 43.0–77.0)
PLATELETS: 246 10*3/uL (ref 150.0–400.0)
RBC: 4.57 Mil/uL (ref 3.87–5.11)
RDW: 12.6 % (ref 11.5–15.5)
WBC: 4.6 10*3/uL (ref 4.0–10.5)

## 2017-07-26 LAB — LIPID PANEL
CHOLESTEROL: 146 mg/dL (ref 0–200)
HDL: 54.9 mg/dL (ref >39.00)
LDL CALC: 75 mg/dL (ref 0–99)
NonHDL: 90.72
Total CHOL/HDL Ratio: 3
Triglycerides: 78 mg/dL (ref 0.0–149.0)
VLDL: 15.6 mg/dL (ref 0.0–40.0)

## 2017-07-26 LAB — SEDIMENTATION RATE: Sed Rate: 8 mm/hr (ref 0–20)

## 2017-07-26 LAB — VITAMIN B12: Vitamin B-12: 414 pg/mL (ref 211–911)

## 2017-07-26 LAB — TSH: TSH: 0.59 u[IU]/mL (ref 0.35–4.50)

## 2017-07-26 LAB — FOLLICLE STIMULATING HORMONE: FSH: 1.9 m[IU]/mL

## 2017-07-26 LAB — FOLATE: FOLATE: 15.1 ng/mL (ref >5.9)

## 2017-07-26 LAB — VITAMIN D 25 HYDROXY (VIT D DEFICIENCY, FRACTURES): VITD: 14.43 ng/mL — AB (ref 30.00–100.00)

## 2017-07-26 LAB — LUTEINIZING HORMONE: LH: 0.45 m[IU]/mL

## 2017-07-27 ENCOUNTER — Other Ambulatory Visit: Payer: Self-pay | Admitting: Internal Medicine

## 2017-07-27 ENCOUNTER — Encounter: Payer: Self-pay | Admitting: Internal Medicine

## 2017-07-27 DIAGNOSIS — E559 Vitamin D deficiency, unspecified: Secondary | ICD-10-CM | POA: Insufficient documentation

## 2017-07-27 DIAGNOSIS — E669 Obesity, unspecified: Secondary | ICD-10-CM

## 2017-07-27 MED ORDER — PHENTERMINE HCL 37.5 MG PO CAPS: 37.5000 mg | ORAL_CAPSULE | ORAL | 2 refills | Status: DC

## 2017-07-27 MED ORDER — CHOLECALCIFEROL 50 MCG (2000 UT) PO TABS: 1.0000 | ORAL_TABLET | Freq: Every day | ORAL | 1 refills | Status: DC

## 2017-07-30 ENCOUNTER — Encounter: Payer: Self-pay | Admitting: Internal Medicine

## 2017-07-30 ENCOUNTER — Ambulatory Visit: Payer: BLUE CROSS/BLUE SHIELD | Admitting: Internal Medicine

## 2017-07-30 DIAGNOSIS — J0110 Acute frontal sinusitis, unspecified: Secondary | ICD-10-CM

## 2017-07-30 DIAGNOSIS — J329 Chronic sinusitis, unspecified: Secondary | ICD-10-CM | POA: Insufficient documentation

## 2017-07-30 MED ORDER — METHYLPREDNISOLONE ACETATE 40 MG/ML IJ SUSP
40.0000 mg | Freq: Once | INTRAMUSCULAR | Status: AC
  Administered 2017-07-30: 40 mg via INTRAMUSCULAR

## 2017-07-30 NOTE — Progress Notes (Signed)
   Subjective:    Patient ID: Stacey Ruiz, female    DOB: [birth year], 42 y.o.   MRN: [id number]  HPI The patient is a 42 YO female coming in for sinus congestion and problems. Started in the last 2-3 days. Lots of congestion. Has been around sick contacts at work. Some chills but no fevers. Cough non-productive but no SOB. Is taking cold medicine, nose spray for allergies, and ibuprofen. Some headaches as well. Some sinus pressure. Overall symptoms worsening.   Review of Systems  Constitutional: Positive for activity change, appetite change and chills. Negative for fatigue, fever and unexpected weight change.  HENT: Positive for congestion, postnasal drip, rhinorrhea and sinus pressure. Negative for ear discharge, ear pain, sinus pain, sneezing, sore throat, tinnitus, trouble swallowing and voice change.   Eyes: Negative.   Respiratory: Positive for cough. Negative for chest tightness, shortness of breath and wheezing.   Cardiovascular: Negative.   Gastrointestinal: Negative.   Neurological: Negative.       Objective:   Physical Exam  Constitutional: She is oriented to person, place, and time. She appears well-developed and well-nourished.  HENT:  Head: Normocephalic and atraumatic.  Oropharynx with redness and clear drainage, nose with swollen turbinates, TMs normal bilaterally, frontal sinus tenderness  Eyes: EOM are normal.  Neck: Normal range of motion. No thyromegaly present.  Cardiovascular: Normal rate and regular rhythm.  Pulmonary/Chest: Effort normal and breath sounds normal. No respiratory distress. She has no wheezes. She has no rales.  Abdominal: Soft.  Musculoskeletal: She exhibits tenderness.  Lymphadenopathy:    She has no cervical adenopathy.  Neurological: She is alert and oriented to person, place, and time.  Skin: Skin is warm and dry.   Vitals:   07/30/17 1600  BP: (!) 150/88  Pulse: 75  Temp: 98.6 F (37 C)  TempSrc: Oral  SpO2: 98%  Weight: 182 lb  (82.6 kg)  Height: 5\' 5"  (1.651 m)      Assessment & Plan:  Depo-medrol 40 mg IM given at visit

## 2017-07-30 NOTE — Assessment & Plan Note (Signed)
No indication for antibiotics as symptoms lasting <14 days. Depo-medrol 40 mg IM given at visit and advised to add zyrtec daily. Can use cold medicine prn.

## 2017-07-30 NOTE — Patient Instructions (Signed)
We have given you the steroid shot today.  Start taking zyrtec (cetirizine) over the counter daily.

## 2017-08-11 ENCOUNTER — Encounter: Payer: Self-pay | Admitting: Internal Medicine

## 2017-09-14 ENCOUNTER — Ambulatory Visit
Admission: RE | Admit: 2017-09-14 | Discharge: 2017-09-14 | Disposition: A | Discharge status: Home / Self Care | Payer: BLUE CROSS/BLUE SHIELD | Source: Ambulatory Visit | Attending: Internal Medicine | Admitting: Internal Medicine

## 2017-09-14 ENCOUNTER — Encounter: Payer: Self-pay | Admitting: Internal Medicine

## 2017-09-14 DIAGNOSIS — R519 Headache, unspecified: Secondary | ICD-10-CM

## 2017-09-14 DIAGNOSIS — R51 Headache: Principal | ICD-10-CM

## 2017-09-16 ENCOUNTER — Other Ambulatory Visit: Payer: Self-pay | Admitting: Internal Medicine

## 2017-09-23 ENCOUNTER — Other Ambulatory Visit: Payer: Self-pay | Admitting: Internal Medicine

## 2017-09-23 DIAGNOSIS — F40243 Fear of flying: Secondary | ICD-10-CM

## 2017-09-23 MED ORDER — DIAZEPAM 5 MG PO TABS
5.0000 mg | ORAL_TABLET | Freq: Two times a day (BID) | ORAL | 0 refills | Status: DC | PRN
Start: 2017-09-23 — End: 2018-02-22

## 2017-09-24 ENCOUNTER — Other Ambulatory Visit: Payer: Self-pay | Admitting: Internal Medicine

## 2017-09-24 DIAGNOSIS — R42 Dizziness and giddiness: Secondary | ICD-10-CM

## 2017-10-08 ENCOUNTER — Ambulatory Visit
Admission: RE | Admit: 2017-10-08 | Discharge: 2017-10-08 | Disposition: A | Discharge status: Home / Self Care | Payer: BLUE CROSS/BLUE SHIELD | Source: Ambulatory Visit | Attending: Internal Medicine | Admitting: Internal Medicine

## 2017-10-08 DIAGNOSIS — R42 Dizziness and giddiness: Secondary | ICD-10-CM

## 2017-10-21 ENCOUNTER — Other Ambulatory Visit: Payer: Self-pay | Admitting: Internal Medicine

## 2017-10-21 ENCOUNTER — Other Ambulatory Visit: Payer: Self-pay

## 2017-10-21 DIAGNOSIS — N760 Acute vaginitis: Secondary | ICD-10-CM

## 2017-10-21 DIAGNOSIS — B9689 Other specified bacterial agents as the cause of diseases classified elsewhere: Secondary | ICD-10-CM

## 2017-10-21 DIAGNOSIS — I10 Essential (primary) hypertension: Secondary | ICD-10-CM

## 2017-10-21 MED ORDER — IRBESARTAN 300 MG PO TABS: 300.0000 mg | ORAL_TABLET | Freq: Every day | ORAL | 1 refills | Status: DC

## 2017-10-21 MED ORDER — METRONIDAZOLE 500 MG PO TABS: 500.0000 mg | ORAL_TABLET | Freq: Two times a day (BID) | ORAL | 2 refills | Status: DC

## 2017-10-21 NOTE — Progress Notes (Signed)
Pt called in with vaginitis and thinks she has a bacterial infection. I advised pt I would send flagyl rx to her pharmacy per protocol. Also advised pt that her last pap was in 2015 and transferred her up to the front to make an annual exam appointment. Pt verbalized understanding.

## 2017-11-08 DIAGNOSIS — M2241 Chondromalacia patellae, right knee: Secondary | ICD-10-CM | POA: Diagnosis not present

## 2018-01-20 ENCOUNTER — Other Ambulatory Visit: Payer: Self-pay | Admitting: Obstetrics

## 2018-01-20 ENCOUNTER — Other Ambulatory Visit: Payer: Self-pay

## 2018-01-20 DIAGNOSIS — N898 Other specified noninflammatory disorders of vagina: Secondary | ICD-10-CM

## 2018-01-20 MED ORDER — FLUCONAZOLE 150 MG PO TABS: 150.0000 mg | ORAL_TABLET | Freq: Once | ORAL | 0 refills | Status: AC

## 2018-01-20 MED ORDER — FLUCONAZOLE 150 MG PO TABS: 150.0000 mg | ORAL_TABLET | Freq: Once | ORAL | 0 refills | Status: DC

## 2018-01-20 NOTE — Progress Notes (Signed)
Rx sent per protocol Pt has AEX scheduled next week.

## 2018-02-03 ENCOUNTER — Ambulatory Visit: Payer: BLUE CROSS/BLUE SHIELD | Admitting: Obstetrics

## 2018-02-17 ENCOUNTER — Ambulatory Visit (INDEPENDENT_AMBULATORY_CARE_PROVIDER_SITE_OTHER): Payer: BLUE CROSS/BLUE SHIELD | Admitting: Obstetrics

## 2018-02-17 ENCOUNTER — Encounter: Payer: Self-pay | Admitting: Obstetrics

## 2018-02-17 VITALS — BP 170/99 | HR 70 | Wt 177.0 lb

## 2018-02-17 DIAGNOSIS — N898 Other specified noninflammatory disorders of vagina: Secondary | ICD-10-CM

## 2018-02-17 DIAGNOSIS — B9689 Other specified bacterial agents as the cause of diseases classified elsewhere: Secondary | ICD-10-CM | POA: Diagnosis not present

## 2018-02-17 DIAGNOSIS — Z1151 Encounter for screening for human papillomavirus (HPV): Secondary | ICD-10-CM

## 2018-02-17 DIAGNOSIS — N760 Acute vaginitis: Secondary | ICD-10-CM | POA: Diagnosis not present

## 2018-02-17 DIAGNOSIS — Z113 Encounter for screening for infections with a predominantly sexual mode of transmission: Secondary | ICD-10-CM | POA: Diagnosis not present

## 2018-02-17 DIAGNOSIS — Z01419 Encounter for gynecological examination (general) (routine) without abnormal findings: Secondary | ICD-10-CM | POA: Diagnosis not present

## 2018-02-17 DIAGNOSIS — Z124 Encounter for screening for malignant neoplasm of cervix: Secondary | ICD-10-CM | POA: Diagnosis not present

## 2018-02-17 LAB — HM PAP SMEAR

## 2018-02-17 MED ORDER — METRONIDAZOLE 500 MG PO TABS: 500.0000 mg | ORAL_TABLET | Freq: Two times a day (BID) | ORAL | 2 refills | Status: DC

## 2018-02-17 NOTE — Progress Notes (Signed)
Subjective:        Stacey Ruiz is a 42 y.o. female here for a routine exam.  Current complaints: None.    Personal health questionnaire:  Is patient Ashkenazi Jewish, have a family history of breast and/or ovarian cancer: yes, 2 aunts with breast.  Mother deceased with ovarian Is there a family history of uterine cancer diagnosed at age < 60, gastrointestinal cancer, urinary tract cancer, family member who is a Field seismologist syndrome-associated carrier: no Is the patient overweight and hypertensive, family history of diabetes, personal history of gestational diabetes, preeclampsia or PCOS: no Is patient over 70, have PCOS,  family history of premature CHD under age 15, diabetes, smoke, have hypertension or peripheral artery disease:  no At any time, has a partner hit, kicked or otherwise hurt or frightened you?: no Over the past 2 weeks, have you felt down, depressed or hopeless?: no Over the past 2 weeks, have you felt little interest or pleasure in doing things?:no   Gynecologic History Patient's last menstrual period was 02/14/2018 (exact date). Contraception: none Last Pap: 2015. Results were: normal Last mammogram: 2019. Results were: normal  Obstetric History OB History  Gravida Para Term Preterm AB Living  1            SAB TAB Ectopic Multiple Live Births               # Outcome Date GA Lbr Len/2nd Weight Sex Delivery Anes PTL Lv  1 Gravida              Birth Comments: System Generated. Please review and update pregnancy details.    Past Medical History:  Diagnosis Date  . Anxiety   . Depression   . Headache(784.0)    migraines  . Hypertension     Past Surgical History:  Procedure Laterality Date  . ABLATION    . FRACTURE SURGERY  2002   left wrist  . TUBAL LIGATION  11/28/2010   Procedure: ESSURE TUBAL STERILIZATION;  Surgeon: Agnes Lawrence, MD;  Location: [facility];  Service: Gynecology;  Laterality: N/A;  Attempted essure sterilization. Essure device  placed in leftt side only. could not do procedure on right fallopian tube.   . TUBAL LIGATION  12/05/2010   Procedure: ESSURE TUBAL STERILIZATION;  Surgeon: Agnes Lawrence, MD;  Location: [facility];  Service: Gynecology;  Laterality: Right;     Current Outpatient Medications:  .  amLODipine (NORVASC) 5 MG tablet, take 1 tablet by mouth once daily, Disp: 90 tablet, Rfl: 3 .  Cholecalciferol 2000 units TABS, Take 1 tablet (2,000 Units total) by mouth daily., Disp: 90 tablet, Rfl: 1 .  diazepam (VALIUM) 5 MG tablet, Take 1 tablet (5 mg total) by mouth every 12 (twelve) hours as needed for anxiety., Disp: 5 tablet, Rfl: 0 .  irbesartan (AVAPRO) 300 MG tablet, Take 1 tablet (300 mg total) by mouth daily., Disp: 90 tablet, Rfl: 1 .  metroNIDAZOLE (FLAGYL) 500 MG tablet, Take 1 tablet (500 mg total) by mouth 2 (two) times daily., Disp: 14 tablet, Rfl: 2 .  phentermine 37.5 MG capsule, Take 1 capsule (37.5 mg total) by mouth every morning., Disp: 30 capsule, Rfl: 2 No Known Allergies  Social History   Tobacco Use  . Smoking status: Former Smoker    Types: Cigarettes    Last attempt to quit: 11/12/2007    Years since quitting: 10.2  . Smokeless tobacco: Never Used  Substance Use Topics  . Alcohol use:  Yes    Alcohol/week: 7.0 standard drinks    Types: 7 Glasses of wine per week    Comment: occ    Family History  Problem Relation Age of Onset  . Cancer Mother   . Breast cancer Maternal Aunt 12  . Diabetes Neg Hx   . Early death Neg Hx   . Heart disease Neg Hx   . Hyperlipidemia Neg Hx   . Hypertension Neg Hx   . Kidney disease Neg Hx   . Stroke Neg Hx       Review of Systems  Constitutional: negative for fatigue and weight loss Respiratory: negative for cough and wheezing Cardiovascular: negative for chest pain, fatigue and palpitations Gastrointestinal: negative for abdominal pain and change in bowel habits Musculoskeletal:negative for myalgias Neurological: negative for gait  problems and tremors Behavioral/Psych: negative for abusive relationship, depression Endocrine: negative for temperature intolerance    Genitourinary:negative for abnormal menstrual periods, genital lesions, hot flashes, sexual problems and vaginal discharge Integument/breast: negative for breast lump, breast tenderness, nipple discharge and skin lesion(s)    Objective:       BP (!) 170/99   Pulse 70   Wt 177 lb (80.3 kg)   LMP 02/14/2018 (Exact Date)   BMI 29.45 kg/m  General:   alert  Skin:   no rash or abnormalities  Lungs:   clear to auscultation bilaterally  Heart:   regular rate and rhythm, S1, S2 normal, no murmur, click, rub or gallop  Breasts:   normal without suspicious masses, skin or nipple changes or axillary nodes  Abdomen:  normal findings: no organomegaly, soft, non-tender and no hernia  Pelvis:  External genitalia: normal general appearance Urinary system: urethral meatus normal and bladder without fullness, nontender Vaginal: normal without tenderness, induration or masses Cervix: normal appearance Adnexa: normal bimanual exam Uterus: anteverted and non-tender, normal size   Lab Review Urine pregnancy test Labs reviewed yes Radiologic studies reviewed yes  50% of 20 min visit spent on counseling and coordination of care.   Assessment:     1. Women's annual routine gynecological examination  2. Screening for cervical cancer Rx: - Cytology - PAP  3. Screening examination for STD (sexually transmitted disease) Rx: - Cervicovaginal ancillary only - Hepatitis B surface antigen - Hepatitis C antibody - HIV Antibody (routine testing w rflx) - RPR  4. Acute vaginitis - BV Rx: - metroNIDAZOLE (FLAGYL) 500 MG tablet; Take 1 tablet (500 mg total) by mouth 2 (two) times daily.  Dispense: 14 tablet; Refill: 2    Plan:    Education reviewed: calcium supplements, depression evaluation, low fat, low cholesterol diet, safe sex/STD prevention, self breast  exams and weight bearing exercise. Follow up in: 1 year.   Meds ordered this encounter  Medications  . metroNIDAZOLE (FLAGYL) 500 MG tablet    Sig: Take 1 tablet (500 mg total) by mouth 2 (two) times daily.    Dispense:  14 tablet    Refill:  2   Orders Placed This Encounter  Procedures  . Hepatitis B surface antigen  . Hepatitis C antibody  . HIV Antibody (routine testing w rflx)  . RPR    Shelly Bombard MD 02-17-2018

## 2018-02-17 NOTE — Progress Notes (Signed)
Annual Exam   Mammogram 06/2017 WNL  Last pap:2015 WNL  LMP: 02/14/18 STD Screening: Desires Full Panel  GN:[id number] vaginal bumps

## 2018-02-18 LAB — CERVICOVAGINAL ANCILLARY ONLY
Bacterial vaginitis: POSITIVE — AB
CANDIDA VAGINITIS: NEGATIVE
Chlamydia: NEGATIVE
Neisseria Gonorrhea: NEGATIVE
Trichomonas: NEGATIVE

## 2018-02-18 LAB — HEPATITIS B SURFACE ANTIGEN: Hepatitis B Surface Ag: NEGATIVE

## 2018-02-18 LAB — RPR: RPR Ser Ql: NONREACTIVE

## 2018-02-18 LAB — CYTOLOGY - PAP
DIAGNOSIS: NEGATIVE
HPV (WINDOPATH): NOT DETECTED

## 2018-02-18 LAB — HIV ANTIBODY (ROUTINE TESTING W REFLEX): HIV Screen 4th Generation wRfx: NONREACTIVE

## 2018-02-18 LAB — HEPATITIS C ANTIBODY: Hep C Virus Ab: <0.1 s/co ratio (ref 0.0–0.9)

## 2018-02-20 ENCOUNTER — Other Ambulatory Visit: Payer: Self-pay | Admitting: Obstetrics

## 2018-02-21 ENCOUNTER — Ambulatory Visit: Payer: Self-pay

## 2018-02-21 NOTE — Telephone Encounter (Signed)
Pt. Reports she has had a headache and felt tired - has been "out of one of my medicines" x 1 month. BP today 159/99. At GYN office "It was even higher." Reports she has had "a lot of added stress as well and feels very anxious." Wakes up in the middle of the night with her heart "racing". Can not come in today for an appointment. Appointment made for tomorrow. Instructed if symptoms worsen to go to ED.  Reason for Disposition . Systolic BP  >= 935 OR Diastolic >= 701  Answer Assessment - Initial Assessment Questions 1. BLOOD PRESSURE: "What is the blood pressure?" "Did you take at least two measurements 5 minutes apart?"     159/99 2. ONSET: "When did you take your blood pressure?"     Just now 3. HOW: "How did you obtain the blood pressure?" (e.g., visiting nurse, automatic home BP monitor)     Home BP monitor 4. HISTORY: "Do you have a history of high blood pressure?"     Yes 5. MEDICATIONS: "Are you taking any medications for blood pressure?" "Have you missed any doses recently?"     Out of one of her meds x 1 month 6. OTHER SYMPTOMS: "Do you have any symptoms?" (e.g., headache, chest pain, blurred vision, difficulty breathing, weakness)     Headache, tired 7. PREGNANCY: "Is there any chance you are pregnant?" "When was your last menstrual period?"     Yes  Protocols used: HIGH BLOOD PRESSURE-A-AH

## 2018-02-22 ENCOUNTER — Encounter: Payer: Self-pay | Admitting: Internal Medicine

## 2018-02-22 ENCOUNTER — Ambulatory Visit: Payer: BLUE CROSS/BLUE SHIELD | Admitting: Internal Medicine

## 2018-02-22 VITALS — BP 146/94 | HR 85 | Temp 98.4°F | Resp 18 | Ht 65.0 in | Wt 171.0 lb

## 2018-02-22 DIAGNOSIS — F329 Major depressive disorder, single episode, unspecified: Secondary | ICD-10-CM | POA: Diagnosis not present

## 2018-02-22 DIAGNOSIS — F419 Anxiety disorder, unspecified: Secondary | ICD-10-CM | POA: Diagnosis not present

## 2018-02-22 DIAGNOSIS — I10 Essential (primary) hypertension: Secondary | ICD-10-CM

## 2018-02-22 DIAGNOSIS — F40243 Fear of flying: Secondary | ICD-10-CM

## 2018-02-22 DIAGNOSIS — F32A Depression, unspecified: Secondary | ICD-10-CM

## 2018-02-22 MED ORDER — DIAZEPAM 5 MG PO TABS: 5.0000 mg | ORAL_TABLET | Freq: Two times a day (BID) | ORAL | 1 refills | Status: DC | PRN

## 2018-02-22 MED ORDER — ESCITALOPRAM OXALATE 10 MG PO TABS: 10.0000 mg | ORAL_TABLET | Freq: Every day | ORAL | 1 refills | Status: DC

## 2018-02-22 MED ORDER — AMLODIPINE BESYLATE 10 MG PO TABS: 10.0000 mg | ORAL_TABLET | Freq: Every day | ORAL | 1 refills | Status: DC

## 2018-02-22 MED ORDER — SPIRONOLACTONE 25 MG PO TABS: 25.0000 mg | ORAL_TABLET | Freq: Every day | ORAL | 0 refills | Status: DC

## 2018-02-22 NOTE — Patient Instructions (Signed)

## 2018-02-22 NOTE — Progress Notes (Signed)
Subjective:  Patient ID: Stacey Ruiz, female    DOB: 10-22-1975  Age: 42 y.o. MRN: [id number]  CC: Hypertension and Depression   HPI Stacey Ruiz presents for f/up - She complains that her blood pressure is not adequately well controlled.  She ran out of amlodipine a few weeks ago but has recently been compliant with the ARB.  She tells me that her 61 year old son was arrested last week for criminal activity.  Since then she has had worsening anxiety, panic attacks, anhedonia, crying spells, and insomnia with early morning awakening.  She also complains of a slight decrease in her appetite and weight loss.  Outpatient Medications Prior to Visit  Medication Sig Dispense Refill  . Cholecalciferol 2000 units TABS Take 1 tablet (2,000 Units total) by mouth daily. 90 tablet 1  . metroNIDAZOLE (FLAGYL) 500 MG tablet Take 1 tablet (500 mg total) by mouth 2 (two) times daily. 14 tablet 2  . amLODipine (NORVASC) 5 MG tablet take 1 tablet by mouth once daily 90 tablet 3  . diazepam (VALIUM) 5 MG tablet Take 1 tablet (5 mg total) by mouth every 12 (twelve) hours as needed for anxiety. 5 tablet 0  . irbesartan (AVAPRO) 300 MG tablet Take 1 tablet (300 mg total) by mouth daily. 90 tablet 1  . phentermine 37.5 MG capsule Take 1 capsule (37.5 mg total) by mouth every morning. 30 capsule 2   No facility-administered medications prior to visit.     ROS Review of Systems  Constitutional: Positive for appetite change, fatigue and unexpected weight change. Negative for diaphoresis.  HENT: Negative.   Eyes: Negative for visual disturbance.  Respiratory: Negative for chest tightness, shortness of breath and wheezing.   Cardiovascular: Negative for chest pain, palpitations and leg swelling.  Gastrointestinal: Negative for abdominal pain, constipation, diarrhea, nausea and vomiting.  Endocrine: Negative.   Genitourinary: Negative.  Negative for difficulty urinating.  Musculoskeletal: Negative.   Negative for arthralgias and myalgias.  Skin: Negative.  Negative for color change.  Neurological: Negative.  Negative for dizziness, weakness and light-headedness.  Hematological: Negative for adenopathy. Does not bruise/bleed easily.  Psychiatric/Behavioral: Positive for dysphoric mood and sleep disturbance. Negative for behavioral problems, confusion, self-injury and suicidal ideas. The patient is nervous/anxious. The patient is not hyperactive.     Objective:  BP (!) 146/94 (BP Location: Left Arm, Patient Position: Sitting, Cuff Size: Normal)   Pulse 85   Temp 98.4 F (36.9 C) (Oral)   Resp 18   Ht 5\' 5"  (1.651 m)   Wt 171 lb (77.6 kg)   LMP 02/19/2018 Comment: s/p BTL  SpO2 98%   BMI 28.46 kg/m   BP Readings from Last 3 Encounters:  02/22/18 (!) 146/94  02/17/18 (!) 170/99  07/30/17 (!) 150/88    Wt Readings from Last 3 Encounters:  02/22/18 171 lb (77.6 kg)  02/17/18 177 lb (80.3 kg)  07/30/17 182 lb (82.6 kg)    Physical Exam  Constitutional: She is oriented to person, place, and time. No distress.  HENT:  Mouth/Throat: Oropharynx is clear and moist. No oropharyngeal exudate.  Eyes: Conjunctivae are normal.  Neck: Normal range of motion. Neck supple. No JVD present. No thyromegaly present.  Cardiovascular: Normal rate, regular rhythm and normal heart sounds.  No murmur heard. Pulmonary/Chest: Effort normal and breath sounds normal. No respiratory distress. She has no wheezes. She has no rales.  Abdominal: Soft. Bowel sounds are normal. She exhibits no mass. There is no hepatosplenomegaly. There  is no tenderness.  Musculoskeletal: Normal range of motion. She exhibits no edema, tenderness or deformity.  Lymphadenopathy:    She has no cervical adenopathy.  Neurological: She is alert and oriented to person, place, and time.  Skin: Skin is warm and dry. She is not diaphoretic. No pallor.  Psychiatric: Her behavior is normal. Judgment and thought content normal. Her  mood appears anxious. Her speech is not delayed. She is not agitated, not aggressive, not slowed and not withdrawn. Cognition and memory are normal. She exhibits a depressed mood. She expresses no homicidal and no suicidal ideation.  + crying  Vitals reviewed.   Lab Results  Component Value Date   WBC 4.6 07/26/2017   HGB 14.3 07/26/2017   HCT 41.6 07/26/2017   PLT 246.0 07/26/2017   GLUCOSE 100 (H) 07/26/2017   CHOL 146 07/26/2017   TRIG 78.0 07/26/2017   HDL 54.90 07/26/2017   LDLCALC 75 07/26/2017   ALT 15 07/26/2017   AST 18 07/26/2017   NA 138 07/26/2017   K 3.7 07/26/2017   CL 103 07/26/2017   CREATININE 0.86 07/26/2017   BUN 10 07/26/2017   CO2 27 07/26/2017   TSH 0.59 07/26/2017   INR 0.9 05/20/2011   HGBA1C 5.2 05/15/2016    No results found.  Assessment & Plan:   Stacey Ruiz was seen today for hypertension and depression.  Diagnoses and all orders for this visit:  Anxiety and depression -     diazepam (VALIUM) 5 MG tablet; Take 1 tablet (5 mg total) by mouth every 12 (twelve) hours as needed for anxiety. -     escitalopram (LEXAPRO) 10 MG tablet; Take 1 tablet (10 mg total) by mouth daily.  Fear of flying  Essential hypertension, benign- Her blood pressure is not adequately well controlled and she has a history of mild hypokalemia.  I do not think she is benefiting from taking the ARB so I have asked her to stop taking it.  Will restart amlodipine at the high dose.  Will also start a potassium sparing diuretic. -     spironolactone (ALDACTONE) 25 MG tablet; Take 1 tablet (25 mg total) by mouth daily. -     amLODipine (NORVASC) 10 MG tablet; Take 1 tablet (10 mg total) by mouth daily.   I have discontinued Stacey Ruiz's amLODipine, phentermine, and irbesartan. I am also having her start on escitalopram, spironolactone, and amLODipine. Additionally, I am having her maintain her Cholecalciferol, metroNIDAZOLE, and diazepam.  Meds ordered this encounter    Medications  . diazepam (VALIUM) 5 MG tablet    Sig: Take 1 tablet (5 mg total) by mouth every 12 (twelve) hours as needed for anxiety.    Dispense:  60 tablet    Refill:  1  . escitalopram (LEXAPRO) 10 MG tablet    Sig: Take 1 tablet (10 mg total) by mouth daily.    Dispense:  90 tablet    Refill:  1  . spironolactone (ALDACTONE) 25 MG tablet    Sig: Take 1 tablet (25 mg total) by mouth daily.    Dispense:  90 tablet    Refill:  0  . amLODipine (NORVASC) 10 MG tablet    Sig: Take 1 tablet (10 mg total) by mouth daily.    Dispense:  90 tablet    Refill:  1     Follow-up: Return in about 2 months (around 04/24/2018).  Scarlette Calico, MD

## 2018-05-02 ENCOUNTER — Ambulatory Visit: Payer: BLUE CROSS/BLUE SHIELD | Admitting: Family

## 2018-05-02 ENCOUNTER — Ambulatory Visit: Payer: Self-pay | Admitting: *Deleted

## 2018-05-02 ENCOUNTER — Encounter: Payer: Self-pay | Admitting: Family

## 2018-05-02 VITALS — BP 160/98 | HR 115 | Temp 98.5°F | Ht 65.0 in | Wt 169.1 lb

## 2018-05-02 DIAGNOSIS — R110 Nausea: Secondary | ICD-10-CM | POA: Diagnosis not present

## 2018-05-02 DIAGNOSIS — R6889 Other general symptoms and signs: Secondary | ICD-10-CM | POA: Diagnosis not present

## 2018-05-02 MED ORDER — ONDANSETRON HCL 4 MG/2ML IJ SOLN
4.0000 mg | Freq: Once | INTRAMUSCULAR | Status: AC
  Administered 2018-05-02: 4 mg via INTRAMUSCULAR

## 2018-05-02 MED ORDER — OSELTAMIVIR PHOSPHATE 75 MG PO CAPS: 75.0000 mg | ORAL_CAPSULE | Freq: Two times a day (BID) | ORAL | 0 refills | Status: DC

## 2018-05-02 MED ORDER — BENZONATATE 100 MG PO CAPS: 100.0000 mg | ORAL_CAPSULE | Freq: Three times a day (TID) | ORAL | 0 refills | Status: DC | PRN

## 2018-05-02 NOTE — Progress Notes (Signed)
Stacey Ruiz is a 43 y.o. female with the following history as recorded in [employer]:  Patient Active Problem List   Diagnosis Date Noted  . Vitamin D deficiency 07/27/2017  . Visit for screening mammogram 05/20/2015  . Hyperglycemia 06/20/2014  . Obesity (BMI 30.0-34.9) 11/28/2012  . Fear of flying 10/12/2011  . Essential hypertension, benign 09/07/2011  . Routine general medical examination at a health care facility 09/07/2011  . Contraceptive management 11/28/2010    Current Outpatient Medications  Medication Sig Dispense Refill  . amLODipine (NORVASC) 10 MG tablet Take 1 tablet (10 mg total) by mouth daily. 90 tablet 1  . Cholecalciferol 2000 units TABS Take 1 tablet (2,000 Units total) by mouth daily. 90 tablet 1  . diazepam (VALIUM) 5 MG tablet Take 1 tablet (5 mg total) by mouth every 12 (twelve) hours as needed for anxiety. 60 tablet 1  . escitalopram (LEXAPRO) 10 MG tablet Take 1 tablet (10 mg total) by mouth daily. 90 tablet 1  . metroNIDAZOLE (FLAGYL) 500 MG tablet Take 1 tablet (500 mg total) by mouth 2 (two) times daily. 14 tablet 2  . spironolactone (ALDACTONE) 25 MG tablet Take 1 tablet (25 mg total) by mouth daily. 90 tablet 0  . benzonatate (TESSALON) 100 MG capsule Take 1 capsule (100 mg total) by mouth 3 (three) times daily as needed. 20 capsule 0  . oseltamivir (TAMIFLU) 75 MG capsule Take 1 capsule (75 mg total) by mouth 2 (two) times daily. 10 capsule 0   No current facility-administered medications for this visit.     Allergies: Patient has no known allergies.  Past Medical History:  Diagnosis Date  . Anxiety   . Depression   . Headache(784.0)    migraines  . Hypertension     Past Surgical History:  Procedure Laterality Date  . ABLATION    . FRACTURE SURGERY  2002   left wrist  . TUBAL LIGATION  11/28/2010   Procedure: ESSURE TUBAL STERILIZATION;  Surgeon: Agnes Lawrence, MD;  Location: [facility];  Service: Gynecology;  Laterality: N/A;   Attempted essure sterilization. Essure device placed in leftt side only. could not do procedure on right fallopian tube.   . TUBAL LIGATION  12/05/2010   Procedure: ESSURE TUBAL STERILIZATION;  Surgeon: Agnes Lawrence, MD;  Location: [facility];  Service: Gynecology;  Laterality: Right;    Family History  Problem Relation Age of Onset  . Cancer Mother   . Breast cancer Maternal Aunt 42  . Diabetes Neg Hx   . Early death Neg Hx   . Heart disease Neg Hx   . Hyperlipidemia Neg Hx   . Hypertension Neg Hx   . Kidney disease Neg Hx   . Stroke Neg Hx     Social History   Tobacco Use  . Smoking status: Former Smoker    Types: Cigarettes    Last attempt to quit: 11/12/2007    Years since quitting: 10.4  . Smokeless tobacco: Never Used  Substance Use Topics  . Alcohol use: Yes    Alcohol/week: 7.0 standard drinks    Types: 7 Glasses of wine per week    Comment: occ    Subjective:  Started yesterday with cough/ congestion- notes that cough worsened as the day progressed; has not taken flu shot this year; has not taken any medication for symptom relief; not prone to asthma or allergies; + body aches, + nausea, +headache; has not been able to take her blood pressure medication  today; tried to go to work earlier today but had to leave early.      Objective:  Vitals:   05/02/18 1539  BP: (!) 160/98  Pulse: (!) 115  Temp: 98.5 F (36.9 C)  TempSrc: Oral  SpO2: 97%  Weight: 169 lb 1.9 oz (76.7 kg)  Height: 5\' 5"  (1.651 m)    General: Well developed, well nourished, in no acute distress  Skin : Warm and dry.  Head: Normocephalic and atraumatic  Eyes: Sclera and conjunctiva clear; pupils round and reactive to light; extraocular movements intact  Ears: External normal; canals clear; tympanic membranes normal  Oropharynx: Pink, supple. No suspicious lesions  Neck: Supple without thyromegaly, adenopathy  Lungs: Respirations unlabored; clear to auscultation bilaterally without wheeze,  rales, rhonchi  CVS exam: normal rate, regular rhythm,  Neurologic: Alert and oriented; speech intact; face symmetrical; moves all extremities well; CNII-XII intact without focal deficit   Assessment:  1. Nausea   2. Flu-like symptoms     Plan:  Based on appearance today in office, will go ahead and treat for flu; Rx for Tamiflu 75 mg bid x 5 days; Rx for Tessalon Perles 100 mg tid prn; Phenergan 25 mg given in office today to help with nausea; increase fluids, rest; work note given for today- encouraged to try and take her blood pressure medication today.   No follow-ups on file.  No orders of the defined types were placed in this encounter.   Requested Prescriptions   Signed Prescriptions Disp Refills  . oseltamivir (TAMIFLU) 75 MG capsule 10 capsule 0    Sig: Take 1 capsule (75 mg total) by mouth 2 (two) times daily.  . benzonatate (TESSALON) 100 MG capsule 20 capsule 0    Sig: Take 1 capsule (100 mg total) by mouth 3 (three) times daily as needed.

## 2018-05-02 NOTE — Telephone Encounter (Signed)
Message from [facility] sent at 05/02/2018 5:30 PM EST   Summary: Call back    Patient is calling stating that she picked up her oseltamivir (TAMIFLU) 75 MG capsule, benzonatate (TESSALON) 100 MG capsule, she is wanting to know if she can also take ibuprofen as well with these medications?  Best call back is [phone]         Pt advised to contact pharmacy with questions related to taking Ibuprofen with prescribed medications. Pt verbalized understanding.   Reason for Disposition . Caller has medication question about med not prescribed by PCP and triager unable to answer question (e.g., compatibility with other med, storage)  Protocols used: MEDICATION QUESTION CALL-A-AH

## 2018-05-06 ENCOUNTER — Ambulatory Visit: Payer: Self-pay

## 2018-05-06 NOTE — Telephone Encounter (Signed)
Patient called and says she is getting over the flu and is doing great. She says she travels for her job and will be on a plane to [place], [place] on Wednesday. She wants to know if there are any precautions she will need to take while traveling to prevent any set backs from the flu or new problems, especially with the outbreak of coronavirus. She says she will be wearing a mask and staying wrapped up and wash her hands. She says if there is any medical advice she needs to know, please let her know. She says she is willing to come in and get a shot, if needed, to boost her immune system. I advised Dr. Ronnald Ramp will be back in the office on Monday, she says any one can let her know, it doesn't have to be him. I advised I will send this to the office and she will receive a call back with the recommendations.   Reason for Disposition . Nursing judgment  Protocols used: NO GUIDELINE OR REFERENCE AVAILABLE-A-AH

## 2018-05-06 NOTE — Telephone Encounter (Signed)
Tried to call pt but no vm to leave a message. Sent patient a message via mychart.

## 2018-10-18 ENCOUNTER — Encounter: Payer: Self-pay | Admitting: Internal Medicine

## 2018-10-18 ENCOUNTER — Ambulatory Visit (INDEPENDENT_AMBULATORY_CARE_PROVIDER_SITE_OTHER): Payer: BC Managed Care – PPO | Admitting: Internal Medicine

## 2018-10-18 DIAGNOSIS — H60392 Other infective otitis externa, left ear: Secondary | ICD-10-CM

## 2018-10-18 DIAGNOSIS — H6090 Unspecified otitis externa, unspecified ear: Secondary | ICD-10-CM | POA: Insufficient documentation

## 2018-10-18 MED ORDER — NEOMYCIN-POLYMYXIN-HC 3.5-10000-1 OT SUSP: 3.0000 [drp] | Freq: Three times a day (TID) | OTIC | 0 refills | Status: DC

## 2018-10-18 NOTE — Progress Notes (Signed)
Virtual Visit via Video Note  I connected with Stacey Ruiz on 10/18/18 at  1:40 PM EDT by a video enabled telemedicine application and verified that I am speaking with the correct person using two identifiers.  The patient and the provider were at separate locations throughout the entire encounter.   I discussed the limitations of evaluation and management by telemedicine and the availability of in person appointments. The patient expressed understanding and agreed to proceed.  History of Present Illness: The patient is a 43 y.o. female with visit for left ear clogged. Started about 3 days ago or so. Denies recent travel or swimming. She thinks she got some water in it while washing hair. Denies allergy or sinus symptoms. Denies fevers or chills. Has no sinus pressure or sore throat. Denies cough or SOB. Overall it is stable. Has tried nothing but has tried yawning etc.   Observations/Objective: Appearance: normal, breathing appears normal, casual grooming, abdomen does not appear distended, throat norma, left ear with tenderness with self tuggin, mental status is A and O times 3  Assessment and Plan: See problem oriented charting  Follow Up Instructions: rx cortisporin ear drops  I discussed the assessment and treatment plan with the patient. The patient was provided an opportunity to ask questions and all were answered. The patient agreed with the plan and demonstrated an understanding of the instructions.   The patient was advised to call back or seek an in-person evaluation if the symptoms worsen or if the condition fails to improve as anticipated.  Hoyt Koch, MD

## 2018-10-18 NOTE — Assessment & Plan Note (Signed)
Rx cortisporin ear drops to help. No allergy symptoms.

## 2018-11-03 DIAGNOSIS — M25571 Pain in right ankle and joints of right foot: Secondary | ICD-10-CM | POA: Diagnosis not present

## 2018-11-15 ENCOUNTER — Other Ambulatory Visit: Payer: Self-pay

## 2018-11-15 DIAGNOSIS — Z20822 Contact with and (suspected) exposure to covid-19: Secondary | ICD-10-CM

## 2018-11-16 LAB — NOVEL CORONAVIRUS, NAA: SARS-CoV-2, NAA: NOT DETECTED

## 2018-12-17 ENCOUNTER — Encounter (HOSPITAL_COMMUNITY): Payer: Self-pay | Admitting: Emergency Medicine

## 2018-12-17 ENCOUNTER — Emergency Department (HOSPITAL_COMMUNITY)
Admission: EM | Admit: 2018-12-17 | Discharge: 2018-12-17 | Disposition: A | Discharge status: Home / Self Care | Payer: BC Managed Care – PPO | Attending: Emergency Medicine | Admitting: Emergency Medicine

## 2018-12-17 ENCOUNTER — Emergency Department (HOSPITAL_COMMUNITY): Payer: BC Managed Care – PPO

## 2018-12-17 DIAGNOSIS — Y939 Activity, unspecified: Secondary | ICD-10-CM | POA: Insufficient documentation

## 2018-12-17 DIAGNOSIS — S32030A Wedge compression fracture of third lumbar vertebra, initial encounter for closed fracture: Secondary | ICD-10-CM | POA: Diagnosis not present

## 2018-12-17 DIAGNOSIS — Y929 Unspecified place or not applicable: Secondary | ICD-10-CM | POA: Insufficient documentation

## 2018-12-17 DIAGNOSIS — I10 Essential (primary) hypertension: Secondary | ICD-10-CM | POA: Insufficient documentation

## 2018-12-17 DIAGNOSIS — R109 Unspecified abdominal pain: Secondary | ICD-10-CM | POA: Diagnosis not present

## 2018-12-17 DIAGNOSIS — Y999 Unspecified external cause status: Secondary | ICD-10-CM | POA: Insufficient documentation

## 2018-12-17 DIAGNOSIS — Z79899 Other long term (current) drug therapy: Secondary | ICD-10-CM | POA: Diagnosis not present

## 2018-12-17 DIAGNOSIS — X58XXXA Exposure to other specified factors, initial encounter: Secondary | ICD-10-CM | POA: Insufficient documentation

## 2018-12-17 DIAGNOSIS — S3992XA Unspecified injury of lower back, initial encounter: Secondary | ICD-10-CM | POA: Diagnosis not present

## 2018-12-17 DIAGNOSIS — N200 Calculus of kidney: Secondary | ICD-10-CM | POA: Diagnosis not present

## 2018-12-17 LAB — CBC WITH DIFFERENTIAL/PLATELET
Abs Immature Granulocytes: 0.03 10*3/uL (ref 0.00–0.07)
Basophils Absolute: 0 10*3/uL (ref 0.0–0.1)
Basophils Relative: 0 %
Eosinophils Absolute: 0.1 10*3/uL (ref 0.0–0.5)
Eosinophils Relative: 1 %
HCT: 45.5 % (ref 36.0–46.0)
Hemoglobin: 15.7 g/dL — ABNORMAL HIGH (ref 12.0–15.0)
Immature Granulocytes: 0 %
Lymphocytes Relative: 17 %
Lymphs Abs: 1.3 10*3/uL (ref 0.7–4.0)
MCH: 31.6 pg (ref 26.0–34.0)
MCHC: 34.5 g/dL (ref 30.0–36.0)
MCV: 91.5 fL (ref 80.0–100.0)
Monocytes Absolute: 0.4 10*3/uL (ref 0.1–1.0)
Monocytes Relative: 5 %
Neutro Abs: 5.8 10*3/uL (ref 1.7–7.7)
Neutrophils Relative %: 77 %
Platelets: 236 10*3/uL (ref 150–400)
RBC: 4.97 MIL/uL (ref 3.87–5.11)
RDW: 12.5 % (ref 11.5–15.5)
WBC: 7.6 10*3/uL (ref 4.0–10.5)
nRBC: 0 % (ref 0.0–0.2)

## 2018-12-17 LAB — URINALYSIS, ROUTINE W REFLEX MICROSCOPIC
Bacteria, UA: NONE SEEN
Bilirubin Urine: NEGATIVE
Glucose, UA: NEGATIVE mg/dL
Hgb urine dipstick: NEGATIVE
Ketones, ur: 5 mg/dL — AB
Leukocytes,Ua: NEGATIVE
Nitrite: NEGATIVE
Protein, ur: 100 mg/dL — AB
Specific Gravity, Urine: 1.01 (ref 1.005–1.030)
pH: 7 (ref 5.0–8.0)

## 2018-12-17 LAB — I-STAT BETA HCG BLOOD, ED (MC, WL, AP ONLY): I-stat hCG, quantitative: <5 m[IU]/mL (ref <5)

## 2018-12-17 LAB — COMPREHENSIVE METABOLIC PANEL
ALT: 16 U/L (ref 0–44)
AST: 28 U/L (ref 15–41)
Albumin: 4.8 g/dL (ref 3.5–5.0)
Alkaline Phosphatase: 57 U/L (ref 38–126)
Anion gap: 12 (ref 5–15)
BUN: 5 mg/dL — ABNORMAL LOW (ref 6–20)
CO2: 26 mmol/L (ref 22–32)
Calcium: 9.8 mg/dL (ref 8.9–10.3)
Chloride: 102 mmol/L (ref 98–111)
Creatinine, Ser: 0.82 mg/dL (ref 0.44–1.00)
GFR calc Af Amer: >60 mL/min (ref >60)
GFR calc non Af Amer: >60 mL/min (ref >60)
Glucose, Bld: 98 mg/dL (ref 70–99)
Potassium: 3.1 mmol/L — ABNORMAL LOW (ref 3.5–5.1)
Sodium: 140 mmol/L (ref 135–145)
Total Bilirubin: 1.2 mg/dL (ref 0.3–1.2)
Total Protein: 8.4 g/dL — ABNORMAL HIGH (ref 6.5–8.1)

## 2018-12-17 MED ORDER — ONDANSETRON HCL 4 MG/2ML IJ SOLN
4.0000 mg | Freq: Once | INTRAMUSCULAR | Status: AC
  Administered 2018-12-17: 4 mg via INTRAVENOUS
  Filled 2018-12-17: qty 2

## 2018-12-17 MED ORDER — DICLOFENAC SODIUM 1 % TD GEL: 4.0000 g | Freq: Four times a day (QID) | TRANSDERMAL | 0 refills | Status: DC

## 2018-12-17 MED ORDER — FENTANYL CITRATE (PF) 100 MCG/2ML IJ SOLN
50.0000 ug | Freq: Once | INTRAMUSCULAR | Status: AC
  Administered 2018-12-17: 50 ug via INTRAVENOUS
  Filled 2018-12-17: qty 2

## 2018-12-17 NOTE — ED Notes (Signed)
Pt resting, family at bedside.  

## 2018-12-17 NOTE — ED Provider Notes (Signed)
[facility] EMERGENCY DEPARTMENT Provider Note   CSN: [id number] Arrival date & time: 12/17/18  1303     History   Chief Complaint Chief Complaint  Patient presents with  . Back Pain  . Nausea  . Emesis    HPI Stacey Ruiz is a 43 y.o. female.     Patient is a 43 year old female with a history of hypertension, migraines and depression who presents with back pain.  She states that she has pain in her back from time to time but this morning she had a sudden onset of pain in her lower back.  She describes it as bilateral but it is more intense on the left side of her back.  It radiates around to her left mid abdomen.  She denies any urinary symptoms.  No vaginal bleeding or discharge.  No fevers.  She has had some associated nausea and vomiting.  No history of similar symptoms in the past.  She says the pain is worse with movement.  She has no radiation down her legs.  No numbness or weakness to her extremities.  No loss of bowel or bladder control.  She has been using ibuprofen for back pain for the last few days but has not taken anything today.     Past Medical History:  Diagnosis Date  . Anxiety   . Depression   . Headache(784.0)    migraines  . Hypertension     Patient Active Problem List   Diagnosis Date Noted  . Otitis externa 10/18/2018  . Vitamin D deficiency 07/27/2017  . Visit for screening mammogram 05/20/2015  . Hyperglycemia 06/20/2014  . Obesity (BMI 30.0-34.9) 11/28/2012  . Fear of flying 10/12/2011  . Essential hypertension, benign 09/07/2011  . Routine general medical examination at a health care facility 09/07/2011  . Contraceptive management 11/28/2010    Past Surgical History:  Procedure Laterality Date  . ABLATION    . FRACTURE SURGERY  2002   left wrist  . TUBAL LIGATION  11/28/2010   Procedure: ESSURE TUBAL STERILIZATION;  Surgeon: Agnes Lawrence, MD;  Location: [facility];  Service: Gynecology;  Laterality: N/A;   Attempted essure sterilization. Essure device placed in leftt side only. could not do procedure on right fallopian tube.   . TUBAL LIGATION  12/05/2010   Procedure: ESSURE TUBAL STERILIZATION;  Surgeon: Agnes Lawrence, MD;  Location: [facility];  Service: Gynecology;  Laterality: Right;     OB History    Gravida  1   Para      Term      Preterm      AB      Living        SAB      TAB      Ectopic      Multiple      Live Births               Home Medications    Prior to Admission medications   Medication Sig Start Date End Date Taking? Authorizing Provider  amLODipine (NORVASC) 10 MG tablet Take 1 tablet (10 mg total) by mouth daily. 02/22/18  Yes Janith Lima, MD  diazepam (VALIUM) 5 MG tablet Take 1 tablet (5 mg total) by mouth every 12 (twelve) hours as needed for anxiety. 02/22/18  Yes Janith Lima, MD  ibuprofen (ADVIL) 200 MG tablet Take 800 mg by mouth every 6 (six) hours as needed for mild pain.   Yes [provider]  diclofenac sodium (VOLTAREN) 1 % GEL Apply 4 g topically 4 (four) times daily. 12/17/18   Malvin Johns, MD  escitalopram (LEXAPRO) 10 MG tablet Take 1 tablet (10 mg total) by mouth daily. Patient not taking: Reported on 12/17/2018 02/22/18   Janith Lima, MD  spironolactone (ALDACTONE) 25 MG tablet Take 1 tablet (25 mg total) by mouth daily. Patient not taking: Reported on 12/17/2018 02/22/18   Janith Lima, MD    Family History Family History  Problem Relation Age of Onset  . Cancer Mother   . Breast cancer Maternal Aunt 38  . Diabetes Neg Hx   . Early death Neg Hx   . Heart disease Neg Hx   . Hyperlipidemia Neg Hx   . Hypertension Neg Hx   . Kidney disease Neg Hx   . Stroke Neg Hx     Social History Social History   Tobacco Use  . Smoking status: Former Smoker    Types: Cigarettes    Quit date: 11/12/2007    Years since quitting: 11.1  . Smokeless tobacco: Never Used  Substance Use Topics  . Alcohol  use: Yes    Alcohol/week: 7.0 standard drinks    Types: 7 Glasses of wine per week    Comment: occ  . Drug use: Yes    Types: Marijuana    Comment: occ     Allergies   Hydrocodone and Oxycodone   Review of Systems Review of Systems  Constitutional: Negative for chills, diaphoresis, fatigue and fever.  HENT: Negative for congestion, rhinorrhea and sneezing.   Eyes: Negative.   Respiratory: Negative for cough, chest tightness and shortness of breath.   Cardiovascular: Negative for chest pain and leg swelling.  Gastrointestinal: Positive for abdominal pain, nausea and vomiting. Negative for blood in stool and diarrhea.  Genitourinary: Negative for difficulty urinating, flank pain, frequency and hematuria.  Musculoskeletal: Positive for back pain. Negative for arthralgias.  Skin: Negative for rash.  Neurological: Negative for dizziness, speech difficulty, weakness, numbness and headaches.     Physical Exam Updated Vital Signs BP (!) 161/102 (BP Location: Right Arm)   Pulse 63   Temp 98.9 F (37.2 C) (Oral)   Resp 18   SpO2 95%   Physical Exam Constitutional:      Appearance: She is well-developed.     Comments: Appears uncomfortable  HENT:     Head: Normocephalic and atraumatic.  Eyes:     Pupils: Pupils are equal, round, and reactive to light.  Neck:     Musculoskeletal: Normal range of motion and neck supple.  Cardiovascular:     Rate and Rhythm: Normal rate and regular rhythm.     Heart sounds: Normal heart sounds.  Pulmonary:     Effort: Pulmonary effort is normal. No respiratory distress.     Breath sounds: Normal breath sounds. No wheezing or rales.  Chest:     Chest wall: No tenderness.  Abdominal:     General: Bowel sounds are normal.     Palpations: Abdomen is soft.     Tenderness: There is abdominal tenderness (Left mid abdomen, no pain in the pelvic area). There is no guarding or rebound.  Musculoskeletal: Normal range of motion.     Comments:  Positive tenderness in the left lower lumbar musculature.  There is no spinal tenderness.  Negative straight leg raise bilaterally.  She has normal sensation and motor function distally.  Pedal pulses are intact.  Lymphadenopathy:  Cervical: No cervical adenopathy.  Skin:    General: Skin is warm and dry.     Findings: No rash.  Neurological:     Mental Status: She is alert and oriented to person, place, and time.      ED Treatments / Results  Labs (all labs ordered are listed, but only abnormal results are displayed) Labs Reviewed  COMPREHENSIVE METABOLIC PANEL - Abnormal; Notable for the following components:      Result Value   Potassium 3.1 (*)    BUN 5 (*)    Total Protein 8.4 (*)    All other components within normal limits  CBC WITH DIFFERENTIAL/PLATELET - Abnormal; Notable for the following components:   Hemoglobin 15.7 (*)    All other components within normal limits  URINALYSIS, ROUTINE W REFLEX MICROSCOPIC - Abnormal; Notable for the following components:   Ketones, ur 5 (*)    Protein, ur 100 (*)    All other components within normal limits  I-STAT BETA HCG BLOOD, ED ([facility], [facility], [facility] ONLY)    EKG None  Radiology Ct Renal Stone Study  Result Date: 12/17/2018 CLINICAL DATA:  Severe low back pain and flank pain since this morning. EXAM: CT ABDOMEN AND PELVIS WITHOUT CONTRAST TECHNIQUE: Multidetector CT imaging of the abdomen and pelvis was performed following the standard protocol without IV contrast. COMPARISON:  None. FINDINGS: Lower chest: Normal. Hepatobiliary: No focal liver abnormality is seen. No gallstones, gallbladder wall thickening, or biliary dilatation. Pancreas: Unremarkable. No pancreatic ductal dilatation or surrounding inflammatory changes. Spleen: Normal in size without focal abnormality. Adrenals/Urinary Tract: Normal adrenal glands. 2 mm stone in the upper pole of the right kidney. Normal left kidney. No hydronephrosis. Bladder appears normal.  Stomach/Bowel: Stomach is within normal limits. Appendix appears normal. No evidence of bowel wall thickening, distention, or inflammatory changes. Vascular/Lymphatic: No significant vascular findings are present. No enlarged abdominal or pelvic lymph nodes. Reproductive: Bilateral tubal ligation devices in place. Otherwise negative. Other: No abdominal wall hernia or abnormality. No abdominopelvic ascites. Musculoskeletal: There is a focal fracture of the anterior superior aspect of L3 which appears to be acute. No protrusion of bone or disc into the spinal canal. The bones are otherwise normal. IMPRESSION: 1. Focal fracture of the anterior superior aspect of L3 which appears to be acute. 2. 2 mm stone in the upper pole of the right kidney. 3. Otherwise, benign appearing abdomen and pelvis. Electronically Signed   By: Lorriane Shire M.D.   On: 12/17/2018 16:12    Procedures Procedures (including critical care time)  Medications Ordered in ED Medications  fentaNYL (SUBLIMAZE) injection 50 mcg (50 mcg Intravenous Given 12/17/18 1427)  ondansetron (ZOFRAN) injection 4 mg (4 mg Intravenous Given 12/17/18 1427)     Initial Impression / Assessment and Plan / ED Course  I have reviewed the triage vital signs and the nursing notes.  Pertinent labs & imaging results that were available during my care of the patient were reviewed by me and considered in my medical decision making (see chart for details).        Patient is a 43 year old female who presents with back and flank pain.  She is neurologically intact.  CT scan does not show any intra-abdominal pathology that is acute although there is a new appearing L3 compression type fracture of the anterior superior endplate.  She does report that she had a minor fall a few days ago when she was walking her dog, the dog got loose and  she fell onto her hands.  She jerked her back and this is likely the etiology of the injury.  I spoke with Dr. Vertell Limber with  neurosurgery who did not feel that a brace would be necessary.  Patient does not want any prescriptions for opioids.  She was given a prescription for Voltaren gel and will continue with ibuprofen and Tylenol.  She was encouraged to follow-up with her PCP.  Her blood pressure is elevated and will need to be rechecked.  Final Clinical Impressions(s) / ED Diagnoses   Final diagnoses:  Compression fracture of L3 vertebra, initial encounter [facility])    ED Discharge Orders         Ordered    diclofenac sodium (VOLTAREN) 1 % GEL  4 times daily     12/17/18 1802           Malvin Johns, MD 12/17/18 [id number]

## 2018-12-17 NOTE — ED Notes (Signed)
Pt ambulatory from lobby room due to inability to sit in wheelchair, pt only able to walk very slow due to being tearful and the pain making her nauseous, she states the only time the pain isn't unbearable is when she is lying flat.

## 2018-12-17 NOTE — ED Triage Notes (Signed)
Pt. Stated, crying, lower back pain started this morning making me sick on my stomach  With N/V

## 2018-12-17 NOTE — ED Notes (Signed)
Patient transported to CT 

## 2018-12-27 ENCOUNTER — Other Ambulatory Visit: Payer: Self-pay

## 2018-12-27 ENCOUNTER — Telehealth: Payer: Self-pay | Admitting: Internal Medicine

## 2018-12-27 ENCOUNTER — Ambulatory Visit (INDEPENDENT_AMBULATORY_CARE_PROVIDER_SITE_OTHER): Payer: BC Managed Care – PPO | Admitting: Internal Medicine

## 2018-12-27 ENCOUNTER — Encounter: Payer: Self-pay | Admitting: Internal Medicine

## 2018-12-27 VITALS — BP 176/112 | HR 61 | Temp 98.1°F | Resp 16 | Ht 65.0 in | Wt 162.0 lb

## 2018-12-27 DIAGNOSIS — I10 Essential (primary) hypertension: Secondary | ICD-10-CM

## 2018-12-27 DIAGNOSIS — S32038G Other fracture of third lumbar vertebra, subsequent encounter for fracture with delayed healing: Secondary | ICD-10-CM

## 2018-12-27 DIAGNOSIS — S32008A Other fracture of unspecified lumbar vertebra, initial encounter for closed fracture: Secondary | ICD-10-CM | POA: Insufficient documentation

## 2018-12-27 DIAGNOSIS — F329 Major depressive disorder, single episode, unspecified: Secondary | ICD-10-CM

## 2018-12-27 DIAGNOSIS — E876 Hypokalemia: Secondary | ICD-10-CM | POA: Diagnosis not present

## 2018-12-27 DIAGNOSIS — F332 Major depressive disorder, recurrent severe without psychotic features: Secondary | ICD-10-CM | POA: Diagnosis not present

## 2018-12-27 DIAGNOSIS — F419 Anxiety disorder, unspecified: Secondary | ICD-10-CM

## 2018-12-27 DIAGNOSIS — S34109A Unspecified injury to unspecified level of lumbar spinal cord, initial encounter: Secondary | ICD-10-CM | POA: Insufficient documentation

## 2018-12-27 DIAGNOSIS — F32A Depression, unspecified: Secondary | ICD-10-CM

## 2018-12-27 MED ORDER — ESCITALOPRAM OXALATE 20 MG PO TABS: 20.0000 mg | ORAL_TABLET | Freq: Every day | ORAL | 1 refills | Status: DC

## 2018-12-27 MED ORDER — DIAZEPAM 5 MG PO TABS: 5.0000 mg | ORAL_TABLET | Freq: Two times a day (BID) | ORAL | 1 refills | Status: DC | PRN

## 2018-12-27 MED ORDER — TRIAMTERENE-HCTZ 37.5-25 MG PO CAPS: 1.0000 | ORAL_CAPSULE | Freq: Every day | ORAL | 0 refills | Status: DC

## 2018-12-27 MED ORDER — AMLODIPINE BESYLATE 10 MG PO TABS: 10.0000 mg | ORAL_TABLET | Freq: Every day | ORAL | 1 refills | Status: DC

## 2018-12-27 MED ORDER — IBUPROFEN 600 MG PO TABS: 600.0000 mg | ORAL_TABLET | Freq: Three times a day (TID) | ORAL | 1 refills | Status: DC | PRN

## 2018-12-27 NOTE — Progress Notes (Signed)
Subjective:  Patient ID: Stacey Ruiz, female    DOB: 05-10-1975  Age: 43 y.o. MRN: [id number]  CC: Hypertension and Depression   HPI Cheryel H Seider presents for f/up - She is under significant stress at the home and complains of anxiety, insomnia, anhedonia, and panic attacks.  According to prescription refills she would have run out of Lexapro months ago.  She tells me she has been taking Valium and Lexapro as needed.  She has not been monitoring her blood pressure.  According to prescription refills she would have run out of her antihypertensive 6 months ago.  She recently tripped over her dog, she fell, was seen in the ED and was found to have a lumbar vertebral fracture.  She is controlling the pain with ibuprofen.  Outpatient Medications Prior to Visit  Medication Sig Dispense Refill   diclofenac sodium (VOLTAREN) 1 % GEL Apply 4 g topically 4 (four) times daily. 100 g 0   amLODipine (NORVASC) 10 MG tablet Take 1 tablet (10 mg total) by mouth daily. 90 tablet 1   diazepam (VALIUM) 5 MG tablet Take 1 tablet (5 mg total) by mouth every 12 (twelve) hours as needed for anxiety. 60 tablet 1   escitalopram (LEXAPRO) 10 MG tablet Take 1 tablet (10 mg total) by mouth daily. (Patient not taking: Reported on 12/17/2018) 90 tablet 1   ibuprofen (ADVIL) 200 MG tablet Take 800 mg by mouth every 6 (six) hours as needed for mild pain.     spironolactone (ALDACTONE) 25 MG tablet Take 1 tablet (25 mg total) by mouth daily. (Patient not taking: Reported on 12/17/2018) 90 tablet 0   No facility-administered medications prior to visit.     ROS Review of Systems  Constitutional: Positive for unexpected weight change (wt loss). Negative for diaphoresis and fatigue.  HENT: Negative.   Eyes: Negative for visual disturbance.  Respiratory: Negative for cough, chest tightness, shortness of breath and wheezing.   Cardiovascular: Negative for chest pain, palpitations and leg swelling.    Gastrointestinal: Negative for abdominal pain, constipation, diarrhea, nausea and vomiting.  Endocrine: Negative.   Genitourinary: Negative.  Negative for difficulty urinating.  Musculoskeletal: Negative for arthralgias and myalgias.  Skin: Negative.  Negative for color change and pallor.  Neurological: Negative for dizziness, weakness, light-headedness and headaches.  Hematological: Negative for adenopathy. Does not bruise/bleed easily.  Psychiatric/Behavioral: Positive for dysphoric mood and sleep disturbance. Negative for agitation, behavioral problems, confusion, decreased concentration, self-injury and suicidal ideas. The patient is nervous/anxious.     Objective:  BP (!) 176/112 (BP Location: Left Arm, Patient Position: Sitting, Cuff Size: Normal)    Pulse 61    Temp 98.1 F (36.7 C) (Oral)    Resp 16    Ht 5\' 5"  (1.651 m)    Wt 162 lb (73.5 kg)    SpO2 99%    BMI 26.96 kg/m   BP Readings from Last 3 Encounters:  12/27/18 (!) 176/112  12/17/18 (!) 159/101  05/02/18 (!) 160/98    Wt Readings from Last 3 Encounters:  12/27/18 162 lb (73.5 kg)  05/02/18 169 lb 1.9 oz (76.7 kg)  02/22/18 171 lb (77.6 kg)    Physical Exam Vitals signs reviewed.  Constitutional:      Appearance: Normal appearance.  HENT:     Nose: Nose normal.     Mouth/Throat:     Mouth: Mucous membranes are moist.  Eyes:     General: No scleral icterus.    Conjunctiva/sclera:  Conjunctivae normal.  Neck:     Musculoskeletal: Normal range of motion and neck supple.  Cardiovascular:     Rate and Rhythm: Normal rate and regular rhythm.     Heart sounds: No murmur.  Pulmonary:     Effort: Pulmonary effort is normal.     Breath sounds: No stridor. No wheezing, rhonchi or rales.  Abdominal:     General: Abdomen is flat. Bowel sounds are normal.     Palpations: There is no hepatomegaly or splenomegaly.     Tenderness: There is no abdominal tenderness.  Musculoskeletal: Normal range of motion.     Right  lower leg: No edema.     Left lower leg: No edema.  Lymphadenopathy:     Cervical: No cervical adenopathy.  Skin:    General: Skin is warm and dry.     Coloration: Skin is not pale.  Neurological:     General: No focal deficit present.     Mental Status: She is alert.  Psychiatric:        Attention and Perception: Attention normal.        Mood and Affect: Mood is anxious and depressed. Affect is angry and tearful. Affect is not labile or flat.        Speech: Speech normal. Speech is not delayed, slurred or tangential.        Behavior: Behavior normal.        Thought Content: Thought content normal. Thought content is not paranoid or delusional. Thought content does not include homicidal or suicidal ideation.     Lab Results  Component Value Date   WBC 7.6 12/17/2018   HGB 15.7 (H) 12/17/2018   HCT 45.5 12/17/2018   PLT 236 12/17/2018   GLUCOSE 98 12/17/2018   CHOL 146 07/26/2017   TRIG 78.0 07/26/2017   HDL 54.90 07/26/2017   LDLCALC 75 07/26/2017   ALT 16 12/17/2018   AST 28 12/17/2018   NA 140 12/17/2018   K 3.1 (L) 12/17/2018   CL 102 12/17/2018   CREATININE 0.82 12/17/2018   BUN 5 (L) 12/17/2018   CO2 26 12/17/2018   TSH 0.59 07/26/2017   INR 0.9 05/20/2011   HGBA1C 5.2 05/15/2016    Ct Renal Stone Study  Result Date: 12/17/2018 CLINICAL DATA:  Severe low back pain and flank pain since this morning. EXAM: CT ABDOMEN AND PELVIS WITHOUT CONTRAST TECHNIQUE: Multidetector CT imaging of the abdomen and pelvis was performed following the standard protocol without IV contrast. COMPARISON:  None. FINDINGS: Lower chest: Normal. Hepatobiliary: No focal liver abnormality is seen. No gallstones, gallbladder wall thickening, or biliary dilatation. Pancreas: Unremarkable. No pancreatic ductal dilatation or surrounding inflammatory changes. Spleen: Normal in size without focal abnormality. Adrenals/Urinary Tract: Normal adrenal glands. 2 mm stone in the upper pole of the right  kidney. Normal left kidney. No hydronephrosis. Bladder appears normal. Stomach/Bowel: Stomach is within normal limits. Appendix appears normal. No evidence of bowel wall thickening, distention, or inflammatory changes. Vascular/Lymphatic: No significant vascular findings are present. No enlarged abdominal or pelvic lymph nodes. Reproductive: Bilateral tubal ligation devices in place. Otherwise negative. Other: No abdominal wall hernia or abnormality. No abdominopelvic ascites. Musculoskeletal: There is a focal fracture of the anterior superior aspect of L3 which appears to be acute. No protrusion of bone or disc into the spinal canal. The bones are otherwise normal. IMPRESSION: 1. Focal fracture of the anterior superior aspect of L3 which appears to be acute. 2. 2 mm stone in the  upper pole of the right kidney. 3. Otherwise, benign appearing abdomen and pelvis. Electronically Signed   By: Lorriane Shire M.D.   On: 12/17/2018 16:12    Assessment & Plan:   Kalie was seen today for hypertension and depression.  Diagnoses and all orders for this visit:  Essential hypertension, benign -     Basic metabolic panel; Future -     amLODipine (NORVASC) 10 MG tablet; Take 1 tablet (10 mg total) by mouth daily. -     triamterene-hydrochlorothiazide (DYAZIDE) 37.5-25 MG capsule; Take 1 each (1 capsule total) by mouth daily.  Chronic hypokalemia -     Basic metabolic panel; Future -     triamterene-hydrochlorothiazide (DYAZIDE) 37.5-25 MG capsule; Take 1 each (1 capsule total) by mouth daily.  Severe episode of recurrent major depressive disorder, without psychotic features ([place]) -     escitalopram (LEXAPRO) 20 MG tablet; Take 1 tablet (20 mg total) by mouth daily.  Other closed fracture of third lumbar vertebra with delayed healing, subsequent encounter -     ibuprofen (ADVIL) 600 MG tablet; Take 1 tablet (600 mg total) by mouth every 8 (eight) hours as needed.  Anxiety and depression -     diazepam  (VALIUM) 5 MG tablet; Take 1 tablet (5 mg total) by mouth every 12 (twelve) hours as needed for anxiety.   I have discontinued Zafirah H. Manganello's escitalopram, spironolactone, and ibuprofen. I am also having her start on triamterene-hydrochlorothiazide, escitalopram, and ibuprofen. Additionally, I am having her maintain her diclofenac sodium, amLODipine, and diazepam.  Meds ordered this encounter  Medications   amLODipine (NORVASC) 10 MG tablet    Sig: Take 1 tablet (10 mg total) by mouth daily.    Dispense:  90 tablet    Refill:  1   triamterene-hydrochlorothiazide (DYAZIDE) 37.5-25 MG capsule    Sig: Take 1 each (1 capsule total) by mouth daily.    Dispense:  90 capsule    Refill:  0   escitalopram (LEXAPRO) 20 MG tablet    Sig: Take 1 tablet (20 mg total) by mouth daily.    Dispense:  90 tablet    Refill:  1   diazepam (VALIUM) 5 MG tablet    Sig: Take 1 tablet (5 mg total) by mouth every 12 (twelve) hours as needed for anxiety.    Dispense:  60 tablet    Refill:  1   ibuprofen (ADVIL) 600 MG tablet    Sig: Take 1 tablet (600 mg total) by mouth every 8 (eight) hours as needed.    Dispense:  90 tablet    Refill:  1     Follow-up: Return in about 6 weeks (around 02/07/2019).  Scarlette Calico, MD

## 2018-12-27 NOTE — Patient Instructions (Signed)

## 2018-12-27 NOTE — Telephone Encounter (Signed)
Patient is requesting refills on Valium and ibuprofen.

## 2018-12-28 NOTE — Telephone Encounter (Signed)
They were sent in yesterday. Pt informed of same.

## 2019-01-16 ENCOUNTER — Encounter: Payer: Self-pay | Admitting: Internal Medicine

## 2019-01-31 ENCOUNTER — Ambulatory Visit (INDEPENDENT_AMBULATORY_CARE_PROVIDER_SITE_OTHER): Payer: BC Managed Care – PPO | Admitting: Internal Medicine

## 2019-01-31 ENCOUNTER — Other Ambulatory Visit (INDEPENDENT_AMBULATORY_CARE_PROVIDER_SITE_OTHER): Payer: BC Managed Care – PPO

## 2019-01-31 ENCOUNTER — Encounter: Payer: Self-pay | Admitting: Internal Medicine

## 2019-01-31 ENCOUNTER — Ambulatory Visit (INDEPENDENT_AMBULATORY_CARE_PROVIDER_SITE_OTHER)
Admission: RE | Admit: 2019-01-31 | Discharge: 2019-01-31 | Disposition: A | Discharge status: Home / Self Care | Payer: BC Managed Care – PPO | Source: Ambulatory Visit | Attending: Internal Medicine | Admitting: Internal Medicine

## 2019-01-31 ENCOUNTER — Other Ambulatory Visit: Payer: Self-pay

## 2019-01-31 VITALS — BP 138/86 | HR 73 | Temp 98.5°F | Resp 16 | Ht 65.0 in | Wt 156.5 lb

## 2019-01-31 DIAGNOSIS — E876 Hypokalemia: Secondary | ICD-10-CM | POA: Diagnosis not present

## 2019-01-31 DIAGNOSIS — N951 Menopausal and female climacteric states: Secondary | ICD-10-CM

## 2019-01-31 DIAGNOSIS — I10 Essential (primary) hypertension: Secondary | ICD-10-CM

## 2019-01-31 DIAGNOSIS — F5104 Psychophysiologic insomnia: Secondary | ICD-10-CM | POA: Diagnosis not present

## 2019-01-31 DIAGNOSIS — S34109D Unspecified injury to unspecified level of lumbar spinal cord, subsequent encounter: Secondary | ICD-10-CM | POA: Diagnosis not present

## 2019-01-31 DIAGNOSIS — S32009D Unspecified fracture of unspecified lumbar vertebra, subsequent encounter for fracture with routine healing: Secondary | ICD-10-CM

## 2019-01-31 DIAGNOSIS — Z1231 Encounter for screening mammogram for malignant neoplasm of breast: Secondary | ICD-10-CM

## 2019-01-31 DIAGNOSIS — S32030A Wedge compression fracture of third lumbar vertebra, initial encounter for closed fracture: Secondary | ICD-10-CM | POA: Diagnosis not present

## 2019-01-31 LAB — BASIC METABOLIC PANEL
BUN: 13 mg/dL (ref 6–23)
CO2: 35 mEq/L — ABNORMAL HIGH (ref 19–32)
Calcium: 9.6 mg/dL (ref 8.4–10.5)
Chloride: 97 mEq/L (ref 96–112)
Creatinine, Ser: 0.68 mg/dL (ref 0.40–1.20)
GFR: 113.36 mL/min (ref >60.00)
Glucose, Bld: 94 mg/dL (ref 70–99)
Potassium: 3.2 mEq/L — ABNORMAL LOW (ref 3.5–5.1)
Sodium: 138 mEq/L (ref 135–145)

## 2019-01-31 LAB — MAGNESIUM: Magnesium: 1.9 mg/dL (ref 1.5–2.5)

## 2019-01-31 MED ORDER — BELSOMRA 10 MG PO TABS: 1.0000 | ORAL_TABLET | Freq: Every evening | ORAL | 0 refills | Status: DC | PRN

## 2019-01-31 NOTE — Patient Instructions (Signed)
Acute Back Pain, Adult Acute back pain is sudden and usually short-lived. It is often caused by an injury to the muscles and tissues in the back. The injury may result from:  A muscle or ligament getting overstretched or torn (strained). Ligaments are tissues that connect bones to each other. Lifting something improperly can cause a back strain.  Wear and tear (degeneration) of the spinal disks. Spinal disks are circular tissue that provides cushioning between the bones of the spine (vertebrae).  Twisting motions, such as while playing sports or doing yard work.  A hit to the back.  Arthritis. You may have a physical exam, lab tests, and imaging tests to find the cause of your pain. Acute back pain usually goes away with rest and home care. Follow these instructions at home: Managing pain, stiffness, and swelling  Take over-the-counter and prescription medicines only as told by your health care provider.  Your health care provider may recommend applying ice during the first 24-48 hours after your pain starts. To do this: ? Put ice in a plastic bag. ? Place a towel between your skin and the bag. ? Leave the ice on for 20 minutes, 2-3 times a day.  If directed, apply heat to the affected area as often as told by your health care provider. Use the heat source that your health care provider recommends, such as a moist heat pack or a heating pad. ? Place a towel between your skin and the heat source. ? Leave the heat on for 20-30 minutes. ? Remove the heat if your skin turns bright red. This is especially important if you are unable to feel pain, heat, or cold. You have a greater risk of getting burned. Activity   Do not stay in bed. Staying in bed for more than 1-2 days can delay your recovery.  Sit up and stand up straight. Avoid leaning forward when you sit, or hunching over when you stand. ? If you work at a desk, sit close to it so you do not need to lean over. Keep your chin tucked  in. Keep your neck drawn back, and keep your elbows bent at a right angle. Your arms should look like the letter "L." ? Sit high and close to the steering wheel when you drive. Add lower back (lumbar) support to your car seat, if needed.  Take short walks on even surfaces as soon as you are able. Try to increase the length of time you walk each day.  Do not sit, drive, or stand in one place for more than 30 minutes at a time. Sitting or standing for long periods of time can put stress on your back.  Do not drive or use heavy machinery while taking prescription pain medicine.  Use proper lifting techniques. When you bend and lift, use positions that put less stress on your back: ? Bend your knees. ? Keep the load close to your body. ? Avoid twisting.  Exercise regularly as told by your health care provider. Exercising helps your back heal faster and helps prevent back injuries by keeping muscles strong and flexible.  Work with a physical therapist to make a safe exercise program, as recommended by your health care provider. Do any exercises as told by your physical therapist. Lifestyle  Maintain a healthy weight. Extra weight puts stress on your back and makes it difficult to have good posture.  Avoid activities or situations that make you feel anxious or stressed. Stress and anxiety increase muscle   tension and can make back pain worse. Learn ways to manage anxiety and stress, such as through exercise. General instructions  Sleep on a firm mattress in a comfortable position. Try lying on your side with your knees slightly bent. If you lie on your back, put a pillow under your knees.  Follow your treatment plan as told by your health care provider. This may include: ? Cognitive or behavioral therapy. ? Acupuncture or massage therapy. ? Meditation or yoga. Contact a health care provider if:  You have pain that is not relieved with rest or medicine.  You have increasing pain going down  into your legs or buttocks.  Your pain does not improve after 2 weeks.  You have pain at night.  You lose weight without trying.  You have a fever or chills. Get help right away if:  You develop new bowel or bladder control problems.  You have unusual weakness or numbness in your arms or legs.  You develop nausea or vomiting.  You develop abdominal pain.  You feel faint. Summary  Acute back pain is sudden and usually short-lived.  Use proper lifting techniques. When you bend and lift, use positions that put less stress on your back.  Take over-the-counter and prescription medicines and apply heat or ice as directed by your health care provider. This information is not intended to replace advice given to you by your health care provider. Make sure you discuss any questions you have with your health care provider. Document Released: 03/23/2005 Document Revised: 07/12/2018 Document Reviewed: 11/04/2016 Elsevier Patient Education  2020 [employer]  

## 2019-01-31 NOTE — Progress Notes (Signed)
Subjective:  Patient ID: Stacey Ruiz, female    DOB: 1975-06-30  Age: 43 y.o. MRN: [id number]  CC: Hypertension   HPI Stacey Ruiz presents for f/up - She fell about 4 to 6 weeks ago and was seen in the ED.  A CT scan revealed a lumbar vertebral fracture.  She tells me the pain is getting somewhat better but she is aggravated that it persists.  She is getting adequate symptom relief with Motrin.  She tells me the pain is an achy sensation that does not radiate into her lower extremities and she denies paresthesias.  She complains of insomnia with frequent awakening and early morning awakening.  She tells me that her mood has improved on Escitalopram and she wants to keep taking it but she says the insomnia is not getting any better.  She also complains of hot flashes and night sweats.  She is status post ablation and has not had a menstrual cycle for several months.  Outpatient Medications Prior to Visit  Medication Sig Dispense Refill  . amLODipine (NORVASC) 10 MG tablet Take 1 tablet (10 mg total) by mouth daily. 90 tablet 1  . diazepam (VALIUM) 5 MG tablet Take 1 tablet (5 mg total) by mouth every 12 (twelve) hours as needed for anxiety. 60 tablet 1  . diclofenac sodium (VOLTAREN) 1 % GEL Apply 4 g topically 4 (four) times daily. 100 g 0  . escitalopram (LEXAPRO) 20 MG tablet Take 1 tablet (20 mg total) by mouth daily. 90 tablet 1  . ibuprofen (ADVIL) 600 MG tablet Take 1 tablet (600 mg total) by mouth every 8 (eight) hours as needed. 90 tablet 1  . triamterene-hydrochlorothiazide (DYAZIDE) 37.5-25 MG capsule Take 1 each (1 capsule total) by mouth daily. 90 capsule 0   No facility-administered medications prior to visit.     ROS Review of Systems  Constitutional: Negative.  Negative for chills, fatigue, fever and unexpected weight change.  HENT: Negative.   Eyes: Negative.   Respiratory: Negative for cough, chest tightness, shortness of breath and wheezing.    Cardiovascular: Negative for chest pain, palpitations and leg swelling.  Gastrointestinal: Negative for abdominal pain, constipation, diarrhea, nausea and vomiting.  Endocrine: Negative.   Genitourinary: Negative.  Negative for difficulty urinating.  Musculoskeletal: Positive for back pain. Negative for arthralgias and myalgias.  Skin: Negative.   Neurological: Negative.   Hematological: Negative for adenopathy. Does not bruise/bleed easily.  Psychiatric/Behavioral: Positive for sleep disturbance. Negative for behavioral problems, decreased concentration, dysphoric mood and suicidal ideas. The patient is not nervous/anxious.     Objective:  BP 138/86 (BP Location: Left Arm, Patient Position: Sitting, Cuff Size: Normal)   Pulse 73   Temp 98.5 F (36.9 C) (Oral)   Resp 16   Ht 5\' 5"  (1.651 m)   Wt 156 lb 8 oz (71 kg)   SpO2 97%   BMI 26.04 kg/m   BP Readings from Last 3 Encounters:  01/31/19 138/86  12/27/18 (!) 176/112  12/17/18 (!) 159/101    Wt Readings from Last 3 Encounters:  01/31/19 156 lb 8 oz (71 kg)  12/27/18 162 lb (73.5 kg)  05/02/18 169 lb 1.9 oz (76.7 kg)    Physical Exam Vitals signs reviewed.  Constitutional:      Appearance: Normal appearance.  HENT:     Mouth/Throat:     Mouth: Mucous membranes are moist.  Eyes:     General: No scleral icterus.    Conjunctiva/sclera: Conjunctivae normal.  Neck:     Musculoskeletal: Normal range of motion.  Cardiovascular:     Rate and Rhythm: Normal rate and regular rhythm.     Heart sounds: No murmur.  Pulmonary:     Effort: Pulmonary effort is normal.     Breath sounds: No stridor. No wheezing, rhonchi or rales.  Abdominal:     General: Abdomen is flat. Bowel sounds are normal.     Palpations: There is no hepatomegaly or splenomegaly.     Tenderness: There is no abdominal tenderness.  Musculoskeletal: Normal range of motion.     Lumbar back: Normal. She exhibits normal range of motion, no bony tenderness,  no swelling, no edema and no deformity.     Right lower leg: No edema.     Left lower leg: No edema.  Lymphadenopathy:     Cervical: No cervical adenopathy.  Skin:    General: Skin is warm and dry.  Neurological:     General: No focal deficit present.     Mental Status: She is alert.     Sensory: Sensation is intact.     Motor: Motor function is intact. No weakness.     Coordination: Coordination is intact.     Deep Tendon Reflexes: Reflexes are normal and symmetric.     Comments: Neg SLR in BLE  Psychiatric:        Mood and Affect: Mood normal.        Behavior: Behavior normal.        Thought Content: Thought content normal.        Judgment: Judgment normal.     Lab Results  Component Value Date   WBC 7.6 12/17/2018   HGB 15.7 (H) 12/17/2018   HCT 45.5 12/17/2018   PLT 236 12/17/2018   GLUCOSE 94 01/31/2019   CHOL 146 07/26/2017   TRIG 78.0 07/26/2017   HDL 54.90 07/26/2017   LDLCALC 75 07/26/2017   ALT 16 12/17/2018   AST 28 12/17/2018   NA 138 01/31/2019   K 3.2 (L) 01/31/2019   CL 97 01/31/2019   CREATININE 0.68 01/31/2019   BUN 13 01/31/2019   CO2 35 (H) 01/31/2019   TSH 1.32 01/31/2019   INR 0.9 05/20/2011   HGBA1C 5.2 05/15/2016    Ct Renal Stone Study  Result Date: 12/17/2018 CLINICAL DATA:  Severe low back pain and flank pain since this morning. EXAM: CT ABDOMEN AND PELVIS WITHOUT CONTRAST TECHNIQUE: Multidetector CT imaging of the abdomen and pelvis was performed following the standard protocol without IV contrast. COMPARISON:  None. FINDINGS: Lower chest: Normal. Hepatobiliary: No focal liver abnormality is seen. No gallstones, gallbladder wall thickening, or biliary dilatation. Pancreas: Unremarkable. No pancreatic ductal dilatation or surrounding inflammatory changes. Spleen: Normal in size without focal abnormality. Adrenals/Urinary Tract: Normal adrenal glands. 2 mm stone in the upper pole of the right kidney. Normal left kidney. No hydronephrosis.  Bladder appears normal. Stomach/Bowel: Stomach is within normal limits. Appendix appears normal. No evidence of bowel wall thickening, distention, or inflammatory changes. Vascular/Lymphatic: No significant vascular findings are present. No enlarged abdominal or pelvic lymph nodes. Reproductive: Bilateral tubal ligation devices in place. Otherwise negative. Other: No abdominal wall hernia or abnormality. No abdominopelvic ascites. Musculoskeletal: There is a focal fracture of the anterior superior aspect of L3 which appears to be acute. No protrusion of bone or disc into the spinal canal. The bones are otherwise normal. IMPRESSION: 1. Focal fracture of the anterior superior aspect of L3 which appears to be  acute. 2. 2 mm stone in the upper pole of the right kidney. 3. Otherwise, benign appearing abdomen and pelvis. Electronically Signed   By: Lorriane Shire M.D.   On: 12/17/2018 16:12    Dg Lumbar Spine Complete  Result Date: 01/31/2019 CLINICAL DATA:  Fracture follow-up EXAM: LUMBAR SPINE - COMPLETE 4+ VIEW COMPARISON:  CT dated 12/17/2018 FINDINGS: Again noted is an endplate deformity involving the L3 vertebral body. There is no new fracture. The height loss of the L3 vertebral body may have slightly increased when compared to prior CT given differences in technique. There are minimal degenerative changes of the lumbar spine. IMPRESSION: Again noted is a compression fracture of the L3 vertebral body with probable interval increase in the height loss given differences in technique (CT versus radiograph). If this patient has ongoing pain, she may benefit from vertebroplasty/kyphoplasty. Electronically Signed   By: Constance Holster M.D.   On: 01/31/2019 23:49    Assessment & Plan:   Madora was seen today for hypertension.  Diagnoses and all orders for this visit:  Essential hypertension, benign- Her blood pressure is adequately well controlled on the current regimen.  I will treat the hypokalemia. -      Magnesium; Future -     Basic metabolic panel; Future -     TSH; Future -     potassium chloride (KLOR-CON) 10 MEQ tablet; Take 1 tablet (10 mEq total) by mouth 2 (two) times daily.  Chronic hypokalemia -     Magnesium; Future -     Basic metabolic panel; Future -     potassium chloride (KLOR-CON) 10 MEQ tablet; Take 1 tablet (10 mEq total) by mouth 2 (two) times daily.  Closed fracture of lumbar vertebra with routine healing and spinal cord injury, subsequent encounter [facility])- She is having persistent trouble with this so I recommended that she undergo an MRI of her lumbar spine to be certain that she does not have a malignant process and to see a spine specialist to consider kyphoplasty since she continues to have pain in the area. -     DG Lumbar Spine Complete; Future -     MR Lumbar Spine Wo Contrast; Future -     Ambulatory referral to Orthopedic Surgery  Psychophysiological insomnia -     Suvorexant (BELSOMRA) 10 MG TABS; Take 1 tablet by mouth at bedtime as needed.  Hot flashes, menopausal- Her FSH is elevated consistent with menopause.  I recommended that she speak to her gynecologist about treating this. -     TSH; Future -     Follicle stimulating hormone; Future -     Luteinizing hormone; Future -     Ambulatory referral to Gynecology  Breast cancer screening by mammogram -     MM DIGITAL SCREENING BILATERAL; Future   I am having Stacey Ruiz start on Belsomra and potassium chloride. I am also having her maintain her diclofenac sodium, amLODipine, triamterene-hydrochlorothiazide, escitalopram, diazepam, and ibuprofen.  Meds ordered this encounter  Medications  . Suvorexant (BELSOMRA) 10 MG TABS    Sig: Take 1 tablet by mouth at bedtime as needed.    Dispense:  9 tablet    Refill:  0  . potassium chloride (KLOR-CON) 10 MEQ tablet    Sig: Take 1 tablet (10 mEq total) by mouth 2 (two) times daily.    Dispense:  180 tablet    Refill:  0     Follow-up:  Return in about 3 months (around  05/03/2019).  Stacey Calico, MD

## 2019-02-01 ENCOUNTER — Other Ambulatory Visit: Payer: Self-pay | Admitting: Internal Medicine

## 2019-02-01 ENCOUNTER — Encounter: Payer: Self-pay | Admitting: Internal Medicine

## 2019-02-02 LAB — FOLLICLE STIMULATING HORMONE: FSH: 122.9 m[IU]/mL

## 2019-02-02 LAB — TSH: TSH: 1.32 u[IU]/mL (ref 0.35–4.50)

## 2019-02-02 LAB — LUTEINIZING HORMONE: LH: 70.91 m[IU]/mL

## 2019-02-03 ENCOUNTER — Encounter: Payer: Self-pay | Admitting: Internal Medicine

## 2019-02-03 MED ORDER — POTASSIUM CHLORIDE CRYS ER 10 MEQ PO TBCR: 10.0000 meq | EXTENDED_RELEASE_TABLET | Freq: Two times a day (BID) | ORAL | 0 refills | Status: DC

## 2019-02-10 IMAGING — MR MR HEAD W/O CM
7 series · 48 of 48 positions shown · non-contrast
Comparison: None.

CLINICAL DATA: Headaches, dizziness, imbalance, and nausea for 6
months.

EXAM:
MRI HEAD WITHOUT CONTRAST
TECHNIQUE: Multiplanar, multiecho pulse sequences of the brain and surrounding
structures were obtained without intravenous contrast.

[Series 5: T1 · sagittal · 4.0mm · 0.75mm/px · 4 of 29 slices shown]
[im 1/29]
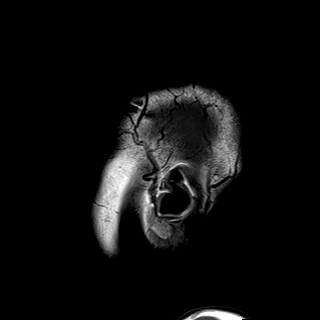
[im 10/29]
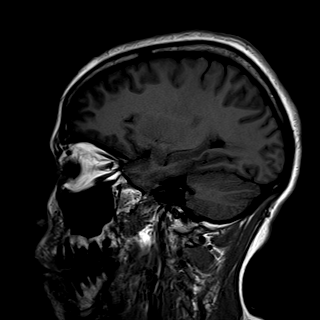
[im 19/29]
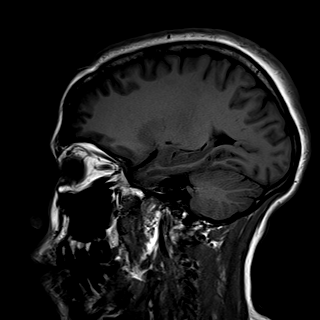
[im 29/29]
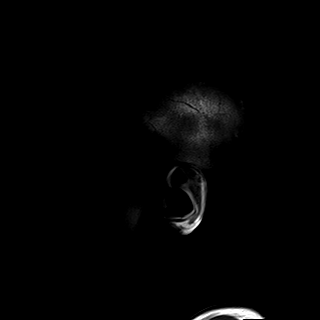

[Series 6: T2 · coronal · 4.5mm · 0.36mm/px · 5 of 30 slices shown (1 of 2)]
[im 1/30]
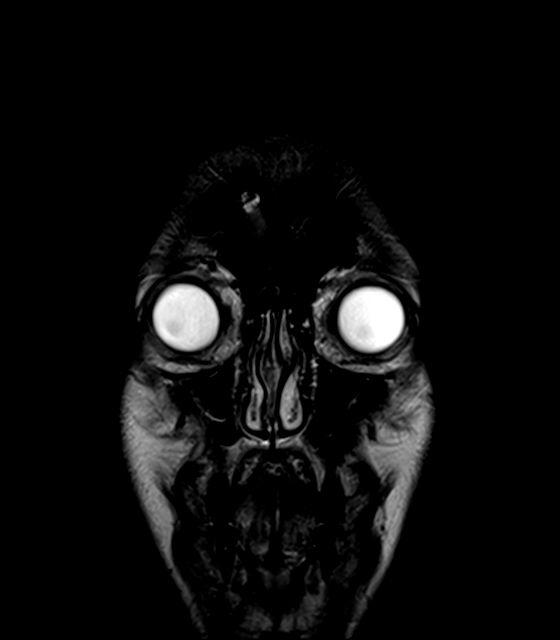
[im 8/30]
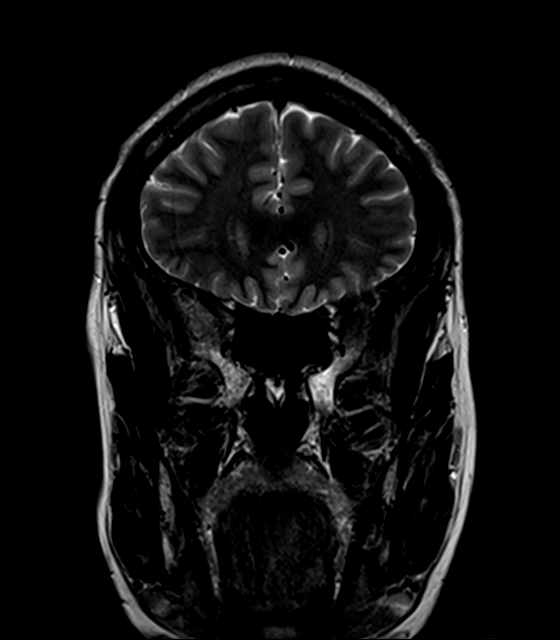
[im 15/30]
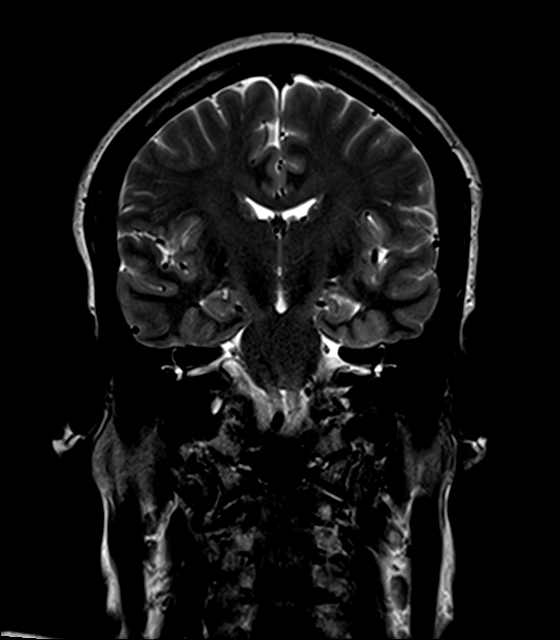
[im 22/30]
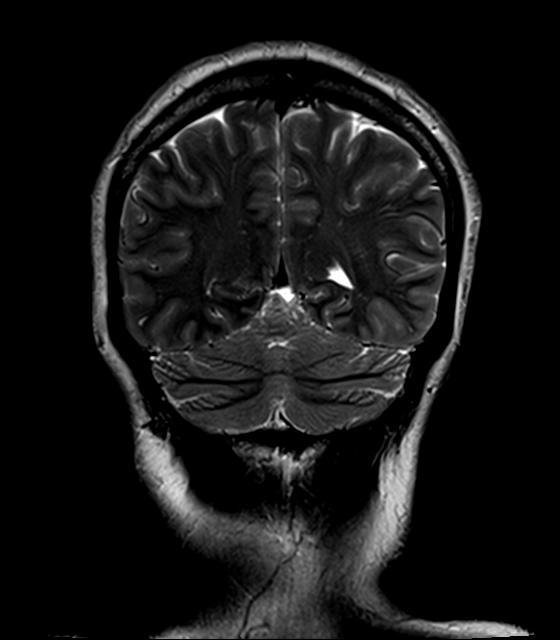
[im 30/30]
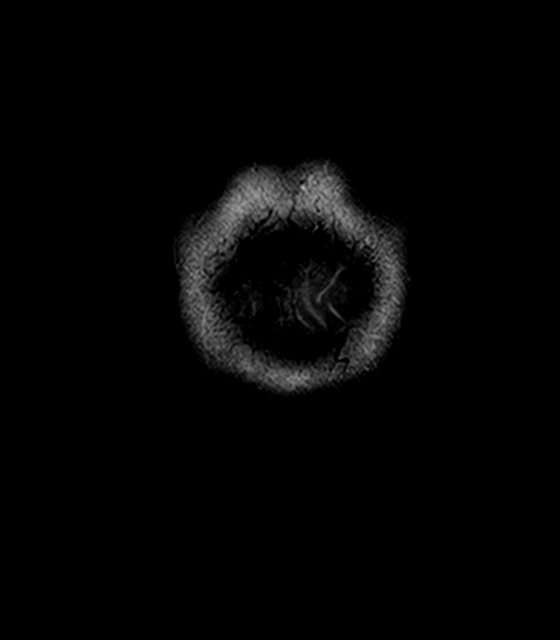

[Series 7: DWI · coronal · 5.0mm · 1.44mm/px · 10 of 64 slices shown (1 of 4)]
[im 1/64]
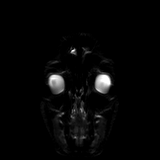
[im 8/64]
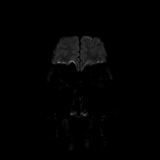
[im 15/64]
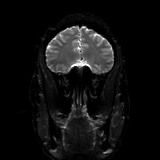
[im 22/64]
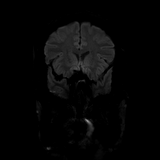
[im 29/64]
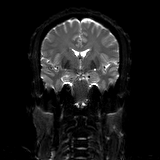
[im 36/64]
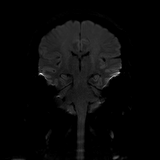
[im 43/64]
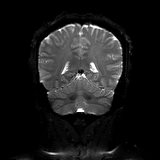
[im 50/64]
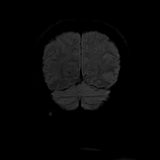
[im 57/64]
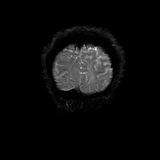
[im 64/64]
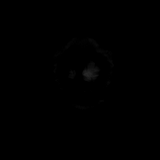

[Series 8: DWI · coronal · 5.0mm · 1.44mm/px · 5 of 32 slices shown (2 of 4)]
[im 1/32]
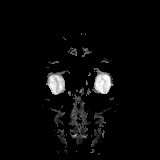
[im 8/32]
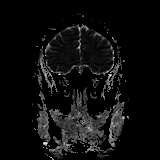
[im 16/32]
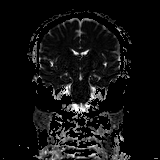
[im 24/32]
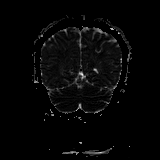
[im 32/32]
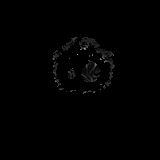

[Series 9: DWI · axial · 3.0mm · 1.44mm/px · z∈[-77,+61]mm · 13 of 86 slices shown (3 of 4)]
[im 1/86]
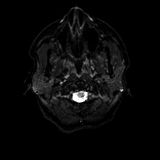
[im 8/86]
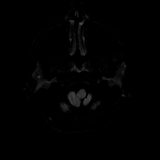
[im 15/86]
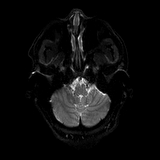
[im 22/86]
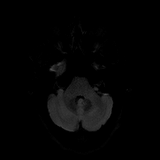
[im 29/86]
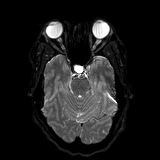
[im 36/86]
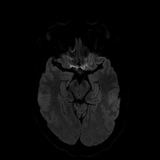
[im 43/86]
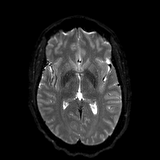
[im 50/86]
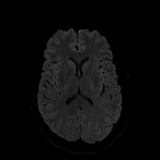
[im 57/86]
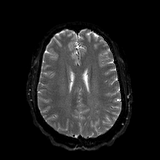
[im 64/86]
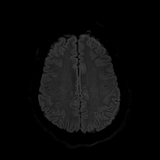
[im 71/86]
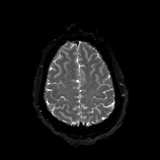
[im 78/86]
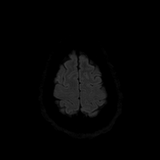
[im 86/86]
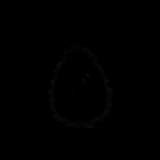

[Series 10: DWI · axial · 3.0mm · 1.44mm/px · z∈[-77,+61]mm · 7 of 43 slices shown (4 of 4)]
[im 1/43]
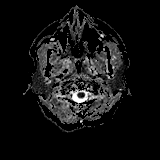
[im 8/43]
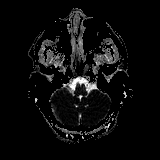
[im 15/43]
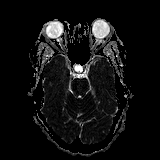
[im 22/43]
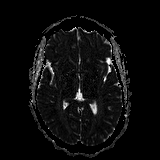
[im 29/43]
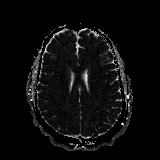
[im 36/43]
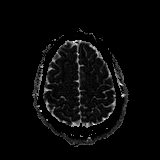
[im 43/43]
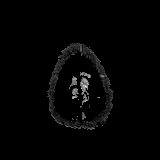

[Series 11: T2 · axial · 4.0mm · 0.36mm/px · z∈[-75,+60]mm · 4 of 27 slices shown (2 of 2)]
[im 1/27]
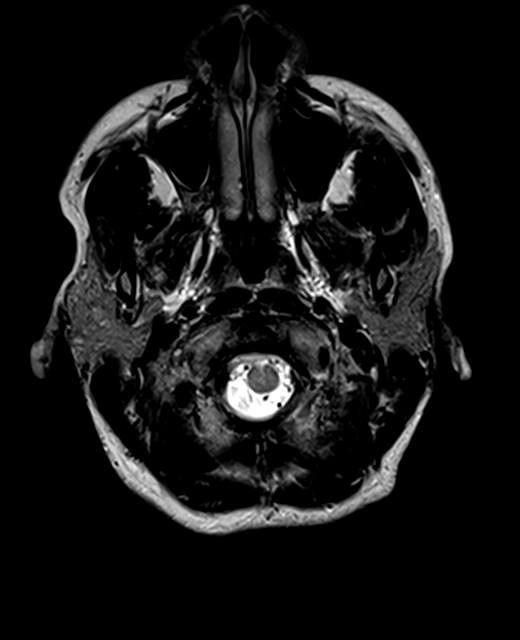
[im 9/27]
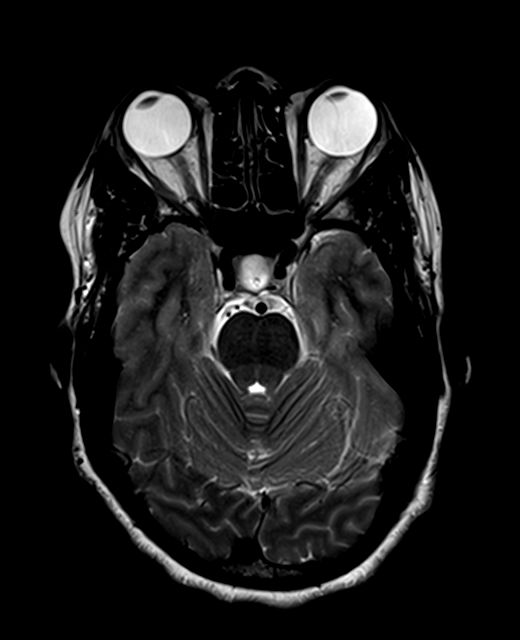
[im 18/27]
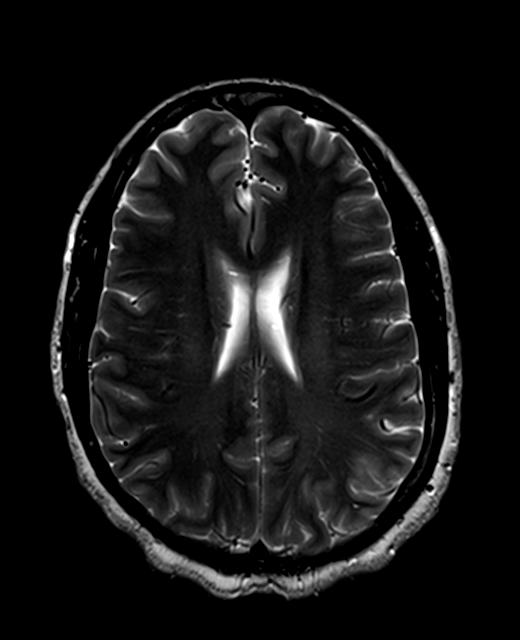
[im 27/27]
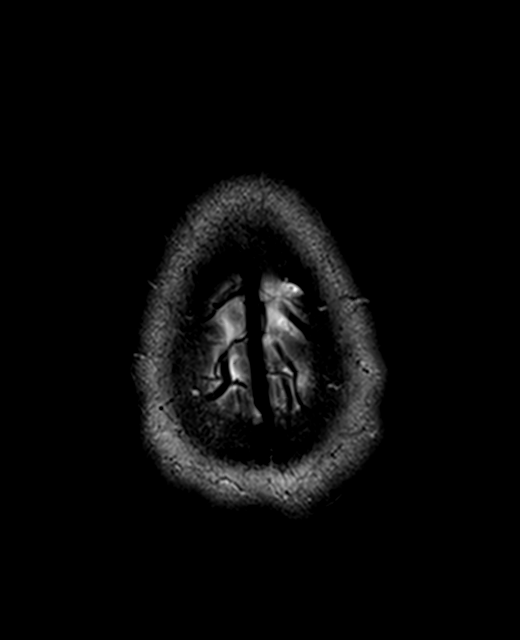

[48 of 48 positions shown; findings below may reference images not displayed]

FINDINGS: The examination had to be discontinued prior to completion due to
patient anxiety. Sagittal T1, axial and coronal diffusion, and axial
and coronal T2 sequences were obtained.

Brain: There is no evidence of acute infarct, intracranial
hemorrhage, mass, midline shift, or extra-axial fluid collection.
The ventricles and sulci are normal. No brain parenchymal signal
abnormality is identified on this incomplete examination. There is a
mildly expanded partially empty sella.

Vascular: Major intracranial vascular flow voids are preserved.

Skull and upper cervical spine: Unremarkable bone marrow signal.

Sinuses/Orbits: Unremarkable orbits. Paranasal sinuses and mastoid
air cells are clear.

Other: None.
IMPRESSION: 1. Incomplete examination without evidence of acute intracranial
abnormality or intracranial mass effect.
2. Partially empty sella, often an incidental finding though can be
seen with idiopathic intracranial hypertension as well.

If the patient decides to return to complete the remainder of the
examination, an addendum will be issued to report any additional
findings revealed on the sequences not obtained today.

## 2019-02-15 ENCOUNTER — Other Ambulatory Visit: Payer: BC Managed Care – PPO

## 2019-02-25 ENCOUNTER — Other Ambulatory Visit: Payer: Self-pay

## 2019-02-25 ENCOUNTER — Other Ambulatory Visit: Payer: BC Managed Care – PPO

## 2019-02-25 ENCOUNTER — Ambulatory Visit
Admission: RE | Admit: 2019-02-25 | Discharge: 2019-02-25 | Disposition: A | Discharge status: Home / Self Care | Payer: BC Managed Care – PPO | Source: Ambulatory Visit | Attending: Internal Medicine | Admitting: Internal Medicine

## 2019-02-25 DIAGNOSIS — S34109D Unspecified injury to unspecified level of lumbar spinal cord, subsequent encounter: Secondary | ICD-10-CM

## 2019-02-25 DIAGNOSIS — S32030A Wedge compression fracture of third lumbar vertebra, initial encounter for closed fracture: Secondary | ICD-10-CM | POA: Diagnosis not present

## 2019-02-25 DIAGNOSIS — S32009D Unspecified fracture of unspecified lumbar vertebra, subsequent encounter for fracture with routine healing: Secondary | ICD-10-CM

## 2019-02-27 ENCOUNTER — Encounter: Payer: Self-pay | Admitting: Internal Medicine

## 2019-03-10 ENCOUNTER — Other Ambulatory Visit: Payer: Self-pay

## 2019-03-10 DIAGNOSIS — Z20822 Contact with and (suspected) exposure to covid-19: Secondary | ICD-10-CM

## 2019-03-11 LAB — NOVEL CORONAVIRUS, NAA: SARS-CoV-2, NAA: NOT DETECTED

## 2019-03-22 ENCOUNTER — Other Ambulatory Visit: Payer: Self-pay | Admitting: Internal Medicine

## 2019-03-22 DIAGNOSIS — I10 Essential (primary) hypertension: Secondary | ICD-10-CM

## 2019-03-22 DIAGNOSIS — E876 Hypokalemia: Secondary | ICD-10-CM

## 2019-03-22 MED ORDER — TRIAMTERENE-HCTZ 37.5-25 MG PO CAPS: 1.0000 | ORAL_CAPSULE | Freq: Every day | ORAL | 0 refills | Status: DC

## 2019-03-24 ENCOUNTER — Ambulatory Visit
Admission: RE | Admit: 2019-03-24 | Discharge: 2019-03-24 | Disposition: A | Discharge status: Home / Self Care | Payer: BC Managed Care – PPO | Source: Ambulatory Visit | Attending: Internal Medicine | Admitting: Internal Medicine

## 2019-03-24 ENCOUNTER — Other Ambulatory Visit: Payer: Self-pay

## 2019-03-24 DIAGNOSIS — Z1231 Encounter for screening mammogram for malignant neoplasm of breast: Secondary | ICD-10-CM | POA: Diagnosis not present

## 2019-03-27 LAB — HM MAMMOGRAPHY

## 2019-03-28 ENCOUNTER — Other Ambulatory Visit: Payer: Self-pay | Admitting: Internal Medicine

## 2019-03-28 DIAGNOSIS — R928 Other abnormal and inconclusive findings on diagnostic imaging of breast: Secondary | ICD-10-CM

## 2019-04-05 ENCOUNTER — Other Ambulatory Visit: Payer: Self-pay

## 2019-04-05 DIAGNOSIS — Z20828 Contact with and (suspected) exposure to other viral communicable diseases: Secondary | ICD-10-CM | POA: Diagnosis not present

## 2019-04-05 DIAGNOSIS — Z20822 Contact with and (suspected) exposure to covid-19: Secondary | ICD-10-CM

## 2019-04-06 ENCOUNTER — Ambulatory Visit: Payer: BC Managed Care – PPO

## 2019-04-06 ENCOUNTER — Ambulatory Visit
Admission: RE | Admit: 2019-04-06 | Discharge: 2019-04-06 | Disposition: A | Discharge status: Home / Self Care | Payer: BC Managed Care – PPO | Source: Ambulatory Visit | Attending: Internal Medicine | Admitting: Internal Medicine

## 2019-04-06 ENCOUNTER — Other Ambulatory Visit: Payer: Self-pay

## 2019-04-06 DIAGNOSIS — R928 Other abnormal and inconclusive findings on diagnostic imaging of breast: Secondary | ICD-10-CM | POA: Diagnosis not present

## 2019-04-06 LAB — NOVEL CORONAVIRUS, NAA: SARS-CoV-2, NAA: NOT DETECTED

## 2019-04-07 LAB — HM MAMMOGRAPHY

## 2019-04-18 DIAGNOSIS — M25571 Pain in right ankle and joints of right foot: Secondary | ICD-10-CM | POA: Diagnosis not present

## 2019-04-21 ENCOUNTER — Emergency Department (HOSPITAL_COMMUNITY)
Admission: EM | Admit: 2019-04-21 | Discharge: 2019-04-21 | Discharge status: Against Medical Advice | Payer: BC Managed Care – PPO

## 2019-04-22 DIAGNOSIS — Z20822 Contact with and (suspected) exposure to covid-19: Secondary | ICD-10-CM | POA: Diagnosis not present

## 2019-04-24 ENCOUNTER — Telehealth: Payer: Self-pay

## 2019-04-24 NOTE — Telephone Encounter (Signed)
Contacted pt and she went to [facility] and did the rapid test and the 2 day test. The rapid test was negative and the 2 day test is not back yet.   Pt stated that her friend was exposed on 04/10/2019, pt was around friend 04/17/2019 and 04/21/2019. Friend has tested positive.   Pt is not having any symptoms at this moment.

## 2019-04-24 NOTE — Telephone Encounter (Signed)
Patient called and spoke with [facility] [employer] on 04/21/2019 7:56:39 PM and states " Caller states that she was exposed to covid today. She is wanting to know what to do she has a newborn, 45 year old, and her92 and 49 year olds children in her home she is wanting to know where to get tested. No symptoms.  [facility] advised her "CALL PCP WHEN OFFICE IS OPEN: * You need to discuss this with your doctor (or NP/PA) within the next few days. WATCH FOR SYMPTOMS OF COUGH AND FEVER: * Watch for symptoms of cough and fever. * Measure your temperature 2 times each day, until 14 days after exposure. * Report any fever or other concerning symptoms to your healthcare provider. CALL BACK (OR CALL YOUR HEALTHCARE PROVIDER) IF: * Fever or feeling feverish occurs within 14 days of COVID-19 exposure. * Cough or difficulty breathing occur within 14 days of COVID-19 exposure. * Other symptoms of COVID-19 infection occur. * You have more questions. CARE ADVICE given per Coronavirus (COVID-19) - Exposure (Adult) guideline.  Patient went to ED, but LWBS.

## 2019-04-28 DIAGNOSIS — M2021 Hallux rigidus, right foot: Secondary | ICD-10-CM | POA: Diagnosis not present

## 2019-05-30 ENCOUNTER — Other Ambulatory Visit: Payer: Self-pay | Admitting: Internal Medicine

## 2019-05-30 DIAGNOSIS — S32038G Other fracture of third lumbar vertebra, subsequent encounter for fracture with delayed healing: Secondary | ICD-10-CM

## 2019-06-20 ENCOUNTER — Other Ambulatory Visit: Payer: Self-pay | Admitting: Internal Medicine

## 2019-06-20 DIAGNOSIS — I10 Essential (primary) hypertension: Secondary | ICD-10-CM

## 2019-06-27 DIAGNOSIS — M2241 Chondromalacia patellae, right knee: Secondary | ICD-10-CM | POA: Diagnosis not present

## 2019-07-19 ENCOUNTER — Other Ambulatory Visit: Payer: Self-pay | Admitting: Internal Medicine

## 2019-07-19 DIAGNOSIS — E876 Hypokalemia: Secondary | ICD-10-CM

## 2019-07-19 DIAGNOSIS — I10 Essential (primary) hypertension: Secondary | ICD-10-CM

## 2019-08-15 ENCOUNTER — Other Ambulatory Visit: Payer: Self-pay | Admitting: Internal Medicine

## 2019-08-15 DIAGNOSIS — I10 Essential (primary) hypertension: Secondary | ICD-10-CM

## 2019-08-15 DIAGNOSIS — E876 Hypokalemia: Secondary | ICD-10-CM

## 2019-08-15 NOTE — Telephone Encounter (Signed)
Please refill as per office routine med refill policy (all routine meds refilled for 3 mo or monthly per pt preference up to one year from last visit, then month to month grace period for 3 mo, then further med refills will have to be denied)  

## 2019-09-14 DIAGNOSIS — Z03818 Encounter for observation for suspected exposure to other biological agents ruled out: Secondary | ICD-10-CM | POA: Diagnosis not present

## 2019-09-18 ENCOUNTER — Other Ambulatory Visit: Payer: Self-pay | Admitting: Internal Medicine

## 2019-09-18 DIAGNOSIS — E876 Hypokalemia: Secondary | ICD-10-CM

## 2019-09-18 DIAGNOSIS — I10 Essential (primary) hypertension: Secondary | ICD-10-CM

## 2019-09-18 NOTE — Telephone Encounter (Signed)
Please refill as per office routine med refill policy (all routine meds refilled for 3 mo or monthly per pt preference up to one year from last visit, then month to month grace period for 3 mo, then further med refills will have to be denied)  

## 2019-10-05 DIAGNOSIS — Z03818 Encounter for observation for suspected exposure to other biological agents ruled out: Secondary | ICD-10-CM | POA: Diagnosis not present

## 2019-10-25 DIAGNOSIS — Z20822 Contact with and (suspected) exposure to covid-19: Secondary | ICD-10-CM | POA: Diagnosis not present

## 2019-10-25 DIAGNOSIS — Z20828 Contact with and (suspected) exposure to other viral communicable diseases: Secondary | ICD-10-CM | POA: Diagnosis not present

## 2019-11-03 DIAGNOSIS — J069 Acute upper respiratory infection, unspecified: Secondary | ICD-10-CM | POA: Diagnosis not present

## 2019-11-10 ENCOUNTER — Telehealth: Payer: Self-pay | Admitting: Internal Medicine

## 2019-11-10 NOTE — Telephone Encounter (Signed)
LOV with PCP was 10/2018  Pt contacted and had questions about testing again and showing the negative result to be able to return to work. Pt was concerned about showing a false positive. Informed patient that she may have a false positive but there is a very small likelihood.

## 2019-11-10 NOTE — Telephone Encounter (Signed)
   Patient tested positive for Covid19 on July 30 She states she feels fine, but has questions  the recovery process.  She is not sure if she should be taking certain vitamins or medications

## 2019-11-11 DIAGNOSIS — Z20822 Contact with and (suspected) exposure to covid-19: Secondary | ICD-10-CM | POA: Diagnosis not present

## 2019-11-19 DIAGNOSIS — Z20828 Contact with and (suspected) exposure to other viral communicable diseases: Secondary | ICD-10-CM | POA: Diagnosis not present

## 2019-11-22 ENCOUNTER — Telehealth (INDEPENDENT_AMBULATORY_CARE_PROVIDER_SITE_OTHER): Payer: BC Managed Care – PPO | Admitting: Family

## 2019-11-22 DIAGNOSIS — J019 Acute sinusitis, unspecified: Secondary | ICD-10-CM | POA: Diagnosis not present

## 2019-11-22 DIAGNOSIS — Z8616 Personal history of COVID-19: Secondary | ICD-10-CM

## 2019-11-22 MED ORDER — BENZONATATE 200 MG PO CAPS: 200.0000 mg | ORAL_CAPSULE | Freq: Three times a day (TID) | ORAL | 0 refills | Status: DC | PRN

## 2019-11-22 MED ORDER — FLUCONAZOLE 150 MG PO TABS: ORAL_TABLET | ORAL | 0 refills | Status: DC

## 2019-11-22 MED ORDER — AMOXICILLIN-POT CLAVULANATE 875-125 MG PO TABS: 1.0000 | ORAL_TABLET | Freq: Two times a day (BID) | ORAL | 0 refills | Status: AC

## 2019-11-22 NOTE — Progress Notes (Signed)
Stacey Ruiz is a 44 y.o. female with the following history as recorded in [employer]:  Patient Active Problem List   Diagnosis Date Noted  . Psychophysiological insomnia 01/31/2019  . Chronic hypokalemia 12/27/2018  . Severe episode of recurrent major depressive disorder, without psychotic features ([place]) 12/27/2018  . Closed fracture of lumbar vertebra with spinal cord injury ([place]) 12/27/2018  . Vitamin D deficiency 07/27/2017  . Hot flashes, menopausal 07/16/2017  . Visit for screening mammogram 05/20/2015  . Hyperglycemia 06/20/2014  . Obesity (BMI 30.0-34.9) 11/28/2012  . Fear of flying 10/12/2011  . Essential hypertension, benign 09/07/2011  . Routine general medical examination at a health care facility 09/07/2011  . Contraceptive management 11/28/2010    Current Outpatient Medications  Medication Sig Dispense Refill  . amLODipine (NORVASC) 10 MG tablet TAKE 1 TABLET(10 MG) BY MOUTH DAILY 90 tablet 1  . diazepam (VALIUM) 5 MG tablet Take 1 tablet (5 mg total) by mouth every 12 (twelve) hours as needed for anxiety. 60 tablet 1  . diclofenac sodium (VOLTAREN) 1 % GEL Apply 4 g topically 4 (four) times daily. 100 g 0  . escitalopram (LEXAPRO) 20 MG tablet Take 1 tablet (20 mg total) by mouth daily. 90 tablet 1  . ibuprofen (ADVIL) 600 MG tablet TAKE 1 TABLET(600 MG) BY MOUTH EVERY 8 HOURS AS NEEDED 90 tablet 1  . triamterene-hydrochlorothiazide (DYAZIDE) 37.5-25 MG capsule TAKE 1 CAPSULE BY MOUTH DAILY 90 capsule 0  . amoxicillin-clavulanate (AUGMENTIN) 875-125 MG tablet Take 1 tablet by mouth 2 (two) times daily for 10 days. 20 tablet 0  . benzonatate (TESSALON) 200 MG capsule Take 1 capsule (200 mg total) by mouth 3 (three) times daily as needed. 30 capsule 0  . fluconazole (DIFLUCAN) 150 MG tablet Take as directed on day 1; repeat after 72 hours 2 tablet 0  . potassium chloride (KLOR-CON) 10 MEQ tablet Take 1 tablet (10 mEq total) by mouth 2 (two) times daily. (Patient not  taking: Reported on 11/22/2019) 180 tablet 0  . Suvorexant (BELSOMRA) 10 MG TABS Take 1 tablet by mouth at bedtime as needed. (Patient not taking: Reported on 11/22/2019) 9 tablet 0   No current facility-administered medications for this visit.    Allergies: Hydrocodone and Oxycodone  Past Medical History:  Diagnosis Date  . Anxiety   . Depression   . Headache(784.0)    migraines  . Hypertension     Past Surgical History:  Procedure Laterality Date  . ABLATION    . FRACTURE SURGERY  2002   left wrist  . TUBAL LIGATION  11/28/2010   Procedure: ESSURE TUBAL STERILIZATION;  Surgeon: Agnes Lawrence, MD;  Location: [facility];  Service: Gynecology;  Laterality: N/A;  Attempted essure sterilization. Essure device placed in leftt side only. could not do procedure on right fallopian tube.   . TUBAL LIGATION  12/05/2010   Procedure: ESSURE TUBAL STERILIZATION;  Surgeon: Agnes Lawrence, MD;  Location: [facility];  Service: Gynecology;  Laterality: Right;    Family History  Problem Relation Age of Onset  . Cancer Mother   . Breast cancer Maternal Aunt 51  . Diabetes Neg Hx   . Early death Neg Hx   . Heart disease Neg Hx   . Hyperlipidemia Neg Hx   . Hypertension Neg Hx   . Kidney disease Neg Hx   . Stroke Neg Hx     Social History   Tobacco Use  . Smoking status: Former Smoker  Types: Cigarettes    Quit date: 11/12/2007    Years since quitting: 12.0  . Smokeless tobacco: Never Used  Substance Use Topics  . Alcohol use: Yes    Alcohol/week: 7.0 standard drinks    Types: 7 Glasses of wine per week    Comment: occ    Subjective:    I connected with Stacey Ruiz on 11/22/19 at  9:20 AM EDT by a video enabled telemedicine application and verified that I am speaking with the correct person using two identifiers.   I discussed the limitations of evaluation and management by telemedicine and the availability of in person appointments. The patient expressed understanding and  agreed to proceed. Provider in office/ patient is at home; provider and patient are only 2 people on video call.   Tested positive for COVID on July 30- released from quarantine on August 10; notes very mild case; still complaining of persisting cough- "feels like a tickle in the back of throat." No fever, chest pain or shortness of breath;  Also mentions that she would like to discuss re-starting Phentermine  LMP- does not have periods   Objective:  There were no vitals filed for this visit.  General: Well developed, well nourished, in no acute distress  Head: Normocephalic and atraumatic  Lungs: Respirations unlabored;  Neurologic: Alert and oriented; speech intact; face symmetrical; moves all extremities well; CNII-XII intact without focal deficit  Assessment:   1. History of COVID-19   2. Acute sinusitis, recurrence not specified, unspecified location     Plan:  Technically out of 14 day window; ? Secondary bacterial infection or persisting inflammation from the original infection; will treat with Augmentin and Tessalon perles; follow-up in office if symptoms persist;  She also understands she needs in office visit to discuss Phenterimine with her PCP;  No follow-ups on file.  No orders of the defined types were placed in this encounter.   Requested Prescriptions   Signed Prescriptions Disp Refills  . amoxicillin-clavulanate (AUGMENTIN) 875-125 MG tablet 20 tablet 0    Sig: Take 1 tablet by mouth 2 (two) times daily for 10 days.  . benzonatate (TESSALON) 200 MG capsule 30 capsule 0    Sig: Take 1 capsule (200 mg total) by mouth 3 (three) times daily as needed.  . fluconazole (DIFLUCAN) 150 MG tablet 2 tablet 0    Sig: Take as directed on day 1; repeat after 72 hours

## 2019-12-08 DIAGNOSIS — Z20822 Contact with and (suspected) exposure to covid-19: Secondary | ICD-10-CM | POA: Diagnosis not present

## 2019-12-14 ENCOUNTER — Other Ambulatory Visit: Payer: Self-pay | Admitting: Internal Medicine

## 2019-12-14 DIAGNOSIS — E876 Hypokalemia: Secondary | ICD-10-CM

## 2019-12-14 DIAGNOSIS — I10 Essential (primary) hypertension: Secondary | ICD-10-CM

## 2019-12-14 NOTE — Telephone Encounter (Signed)
Please refill as per office routine med refill policy (all routine meds refilled for 3 mo or monthly per pt preference up to one year from last visit, then month to month grace period for 3 mo, then further med refills will have to be denied)  

## 2019-12-15 DIAGNOSIS — Z20822 Contact with and (suspected) exposure to covid-19: Secondary | ICD-10-CM | POA: Diagnosis not present

## 2019-12-22 DIAGNOSIS — Z20822 Contact with and (suspected) exposure to covid-19: Secondary | ICD-10-CM | POA: Diagnosis not present

## 2019-12-29 DIAGNOSIS — Z20822 Contact with and (suspected) exposure to covid-19: Secondary | ICD-10-CM | POA: Diagnosis not present

## 2020-01-05 DIAGNOSIS — Z20822 Contact with and (suspected) exposure to covid-19: Secondary | ICD-10-CM | POA: Diagnosis not present

## 2020-01-07 DIAGNOSIS — R0981 Nasal congestion: Secondary | ICD-10-CM | POA: Diagnosis not present

## 2020-01-07 DIAGNOSIS — J029 Acute pharyngitis, unspecified: Secondary | ICD-10-CM | POA: Diagnosis not present

## 2020-01-07 DIAGNOSIS — Z20822 Contact with and (suspected) exposure to covid-19: Secondary | ICD-10-CM | POA: Diagnosis not present

## 2020-01-07 DIAGNOSIS — J069 Acute upper respiratory infection, unspecified: Secondary | ICD-10-CM | POA: Diagnosis not present

## 2020-01-12 DIAGNOSIS — Z20822 Contact with and (suspected) exposure to covid-19: Secondary | ICD-10-CM | POA: Diagnosis not present

## 2020-01-15 ENCOUNTER — Other Ambulatory Visit: Payer: Self-pay

## 2020-01-15 ENCOUNTER — Ambulatory Visit: Payer: BC Managed Care – PPO | Admitting: Internal Medicine

## 2020-01-15 ENCOUNTER — Encounter: Payer: Self-pay | Admitting: Internal Medicine

## 2020-01-15 VITALS — BP 134/76 | HR 79 | Temp 98.4°F | Resp 16 | Ht 65.0 in | Wt 195.0 lb

## 2020-01-15 DIAGNOSIS — Z0000 Encounter for general adult medical examination without abnormal findings: Secondary | ICD-10-CM | POA: Diagnosis not present

## 2020-01-15 DIAGNOSIS — E669 Obesity, unspecified: Secondary | ICD-10-CM

## 2020-01-15 DIAGNOSIS — E876 Hypokalemia: Secondary | ICD-10-CM

## 2020-01-15 DIAGNOSIS — I10 Essential (primary) hypertension: Secondary | ICD-10-CM

## 2020-01-15 DIAGNOSIS — E559 Vitamin D deficiency, unspecified: Secondary | ICD-10-CM | POA: Diagnosis not present

## 2020-01-15 LAB — BASIC METABOLIC PANEL
BUN: 9 mg/dL (ref 6–23)
CO2: 28 mEq/L (ref 19–32)
Calcium: 9.6 mg/dL (ref 8.4–10.5)
Chloride: 98 mEq/L (ref 96–112)
Creatinine, Ser: 0.74 mg/dL (ref 0.40–1.20)
GFR: 97.38 mL/min (ref >60.00)
Glucose, Bld: 109 mg/dL — ABNORMAL HIGH (ref 70–99)
Potassium: 3.9 mEq/L (ref 3.5–5.1)
Sodium: 135 mEq/L (ref 135–145)

## 2020-01-15 LAB — URINALYSIS, ROUTINE W REFLEX MICROSCOPIC
Bilirubin Urine: NEGATIVE
Hgb urine dipstick: NEGATIVE
Ketones, ur: NEGATIVE
Leukocytes,Ua: NEGATIVE
Nitrite: NEGATIVE
RBC / HPF: NONE SEEN (ref 0–?)
Specific Gravity, Urine: 1.01 (ref 1.000–1.030)
Total Protein, Urine: NEGATIVE
Urine Glucose: NEGATIVE
Urobilinogen, UA: 0.2 (ref 0.0–1.0)
WBC, UA: NONE SEEN (ref 0–?)
pH: 6.5 (ref 5.0–8.0)

## 2020-01-15 LAB — HEPATIC FUNCTION PANEL
ALT: 16 U/L (ref 0–35)
AST: 20 U/L (ref 0–37)
Albumin: 4.4 g/dL (ref 3.5–5.2)
Alkaline Phosphatase: 72 U/L (ref 39–117)
Bilirubin, Direct: 0.1 mg/dL (ref 0.0–0.3)
Total Bilirubin: 0.9 mg/dL (ref 0.2–1.2)
Total Protein: 7.9 g/dL (ref 6.0–8.3)

## 2020-01-15 LAB — CBC WITH DIFFERENTIAL/PLATELET
Basophils Absolute: 0.1 10*3/uL (ref 0.0–0.1)
Basophils Relative: 1.5 % (ref 0.0–3.0)
Eosinophils Absolute: 0.2 10*3/uL (ref 0.0–0.7)
Eosinophils Relative: 2.8 % (ref 0.0–5.0)
HCT: 39.5 % (ref 36.0–46.0)
Hemoglobin: 13.6 g/dL (ref 12.0–15.0)
Lymphocytes Relative: 40.7 % (ref 12.0–46.0)
Lymphs Abs: 2.3 10*3/uL (ref 0.7–4.0)
MCHC: 34.4 g/dL (ref 30.0–36.0)
MCV: 88.2 fl (ref 78.0–100.0)
Monocytes Absolute: 0.6 10*3/uL (ref 0.1–1.0)
Monocytes Relative: 10.4 % (ref 3.0–12.0)
Neutro Abs: 2.6 10*3/uL (ref 1.4–7.7)
Neutrophils Relative %: 44.6 % (ref 43.0–77.0)
Platelets: 333 10*3/uL (ref 150.0–400.0)
RBC: 4.48 Mil/uL (ref 3.87–5.11)
RDW: 12.7 % (ref 11.5–15.5)
WBC: 5.8 10*3/uL (ref 4.0–10.5)

## 2020-01-15 LAB — LIPID PANEL
Cholesterol: 165 mg/dL (ref 0–200)
HDL: 43.5 mg/dL (ref >39.00)
LDL Cholesterol: 92 mg/dL (ref 0–99)
NonHDL: 121.57
Total CHOL/HDL Ratio: 4
Triglycerides: 149 mg/dL (ref 0.0–149.0)
VLDL: 29.8 mg/dL (ref 0.0–40.0)

## 2020-01-15 LAB — TSH: TSH: 1.98 u[IU]/mL (ref 0.35–4.50)

## 2020-01-15 LAB — VITAMIN D 25 HYDROXY (VIT D DEFICIENCY, FRACTURES): VITD: 39.28 ng/mL (ref 30.00–100.00)

## 2020-01-15 MED ORDER — PHENTERMINE HCL 37.5 MG PO CAPS: 37.5000 mg | ORAL_CAPSULE | ORAL | 2 refills | Status: DC

## 2020-01-15 NOTE — Progress Notes (Signed)
Subjective:  Patient ID: Stacey Ruiz, female    DOB: 02-19-1976  Age: 44 y.o. MRN: [id number]  CC: Annual Exam and Hypertension  This visit occurred during the SARS-CoV-2 public health emergency.  Safety protocols were in place, including screening questions prior to the visit, additional usage of staff PPE, and extensive cleaning of exam room while observing appropriate contact time as indicated for disinfecting solutions.    HPI Stacey Ruiz presents for a CPX.  She complains of weight gain and wants to take an appetite suppressant.  She walks about 5 or 6 miles a day and does not experience headache, blurred vision, chest pain, shortness of breath, palpitations, edema, or fatigue.  Outpatient Medications Prior to Visit  Medication Sig Dispense Refill  . amLODipine (NORVASC) 10 MG tablet TAKE 1 TABLET(10 MG) BY MOUTH DAILY 90 tablet 1  . diclofenac sodium (VOLTAREN) 1 % GEL Apply 4 g topically 4 (four) times daily. 100 g 0  . escitalopram (LEXAPRO) 20 MG tablet Take 1 tablet (20 mg total) by mouth daily. 90 tablet 1  . ibuprofen (ADVIL) 600 MG tablet TAKE 1 TABLET(600 MG) BY MOUTH EVERY 8 HOURS AS NEEDED 90 tablet 1  . triamterene-hydrochlorothiazide (DYAZIDE) 37.5-25 MG capsule TAKE 1 CAPSULE BY MOUTH DAILY 90 capsule 0  . benzonatate (TESSALON) 200 MG capsule Take 1 capsule (200 mg total) by mouth 3 (three) times daily as needed. 30 capsule 0  . diazepam (VALIUM) 5 MG tablet Take 1 tablet (5 mg total) by mouth every 12 (twelve) hours as needed for anxiety. 60 tablet 1  . fluconazole (DIFLUCAN) 150 MG tablet Take as directed on day 1; repeat after 72 hours 2 tablet 0  . potassium chloride (KLOR-CON) 10 MEQ tablet Take 1 tablet (10 mEq total) by mouth 2 (two) times daily. 180 tablet 0  . Suvorexant (BELSOMRA) 10 MG TABS Take 1 tablet by mouth at bedtime as needed. 9 tablet 0   No facility-administered medications prior to visit.    ROS Review of Systems  Constitutional:  Positive for unexpected weight change (wt gain). Negative for appetite change, chills, diaphoresis and fatigue.  HENT: Negative.   Eyes: Negative.   Respiratory: Negative.  Negative for apnea, cough, shortness of breath and wheezing.   Cardiovascular: Negative for chest pain, palpitations and leg swelling.  Gastrointestinal: Negative for abdominal pain, constipation, diarrhea, nausea and vomiting.  Endocrine: Negative.   Genitourinary: Negative.   Musculoskeletal: Negative for arthralgias and myalgias.  Skin: Negative for color change, pallor and rash.  Neurological: Negative.  Negative for dizziness, weakness and light-headedness.  Hematological: Negative for adenopathy. Does not bruise/bleed easily.  Psychiatric/Behavioral: Negative.  Negative for dysphoric mood and sleep disturbance. The patient is not nervous/anxious.     Objective:  BP 134/76   Pulse 79   Temp 98.4 F (36.9 C) (Oral)   Resp 16   Ht 5\' 5"  (1.651 m)   Wt 195 lb (88.5 kg)   SpO2 97%   BMI 32.45 kg/m   BP Readings from Last 3 Encounters:  01/15/20 134/76  01/31/19 138/86  12/27/18 (!) 176/112    Wt Readings from Last 3 Encounters:  01/15/20 195 lb (88.5 kg)  01/31/19 156 lb 8 oz (71 kg)  12/27/18 162 lb (73.5 kg)    Physical Exam Vitals reviewed.  Constitutional:      General: She is not in acute distress.    Appearance: She is obese. She is not toxic-appearing or diaphoretic.  HENT:  Nose: Nose normal.     Mouth/Throat:     Mouth: Mucous membranes are moist.  Eyes:     General: No scleral icterus.    Conjunctiva/sclera: Conjunctivae normal.  Cardiovascular:     Rate and Rhythm: Normal rate and regular rhythm.     Heart sounds: No murmur heard.   Pulmonary:     Effort: Pulmonary effort is normal.     Breath sounds: No stridor. No wheezing, rhonchi or rales.  Abdominal:     General: Abdomen is flat. Bowel sounds are normal. There is no distension.     Palpations: Abdomen is soft. There  is no hepatomegaly, splenomegaly or mass.     Tenderness: There is no abdominal tenderness.  Musculoskeletal:        General: Normal range of motion.     Cervical back: Neck supple.  Lymphadenopathy:     Cervical: No cervical adenopathy.  Skin:    General: Skin is warm and dry.     Findings: No rash.  Neurological:     General: No focal deficit present.     Mental Status: She is alert and oriented to person, place, and time. Mental status is at baseline.  Psychiatric:        Mood and Affect: Mood normal.        Behavior: Behavior normal.        Thought Content: Thought content normal.     Lab Results  Component Value Date   WBC 5.8 01/15/2020   HGB 13.6 01/15/2020   HCT 39.5 01/15/2020   PLT 333.0 01/15/2020   GLUCOSE 109 (H) 01/15/2020   CHOL 165 01/15/2020   TRIG 149.0 01/15/2020   HDL 43.50 01/15/2020   LDLCALC 92 01/15/2020   ALT 16 01/15/2020   AST 20 01/15/2020   NA 135 01/15/2020   K 3.9 01/15/2020   CL 98 01/15/2020   CREATININE 0.74 01/15/2020   BUN 9 01/15/2020   CO2 28 01/15/2020   TSH 1.98 01/15/2020   INR 0.9 05/20/2011   HGBA1C 5.2 05/15/2016    No results found.  Assessment & Plan:   Stacey Ruiz was seen today for annual exam and hypertension.  Diagnoses and all orders for this visit:  Essential hypertension, benign- Her BP is well controlled. Lytes and renal function are norma. -     CBC with Differential/Platelet; Future -     Cancel: BASIC METABOLIC PANEL WITH GFR; Future -     Urinalysis, Routine w reflex microscopic; Future -     TSH; Future -     Hepatic function panel; Future -     Basic metabolic panel; Future -     Basic metabolic panel -     Hepatic function panel -     TSH -     Urinalysis, Routine w reflex microscopic -     CBC with Differential/Platelet  Chronic hypokalemia- Her K+ level is normal now. -     Cancel: BASIC METABOLIC PANEL WITH GFR; Future -     Basic metabolic panel; Future -     Basic metabolic  panel  Routine general medical examination at a health care facility- Exam completed, labs reviewed - statin tx is not indicated, she refuses flu and covid vaccines, cancer screenings are UTD, pt ed material was given. -     Lipid panel; Future -     Lipid panel  Vitamin D deficiency- Her Vit D level is normal now. -  VITAMIN D 25 Hydroxy (Vit-D Deficiency, Fractures); Future -     VITAMIN D 25 Hydroxy (Vit-D Deficiency, Fractures)  Obesity (BMI 30.0-34.9) -     phentermine 37.5 MG capsule; Take 1 capsule (37.5 mg total) by mouth every morning.   I have discontinued Lyrik H. Stjames's diazepam, Belsomra, potassium chloride, benzonatate, and fluconazole. I am also having her start on phentermine. Additionally, I am having her maintain her diclofenac sodium, escitalopram, ibuprofen, amLODipine, and triamterene-hydrochlorothiazide.  Meds ordered this encounter  Medications  . phentermine 37.5 MG capsule    Sig: Take 1 capsule (37.5 mg total) by mouth every morning.    Dispense:  30 capsule    Refill:  2     Follow-up: Return in about 3 months (around 04/16/2020).  Scarlette Calico, MD

## 2020-01-15 NOTE — Patient Instructions (Signed)
Health Maintenance, Female Adopting a healthy lifestyle and getting preventive care are important in promoting health and wellness. Ask your health care provider about:  The right schedule for you to have regular tests and exams.  Things you can do on your own to prevent diseases and keep yourself healthy. What should I know about diet, weight, and exercise? Eat a healthy diet   Eat a diet that includes plenty of vegetables, fruits, low-fat dairy products, and lean protein.  Do not eat a lot of foods that are high in solid fats, added sugars, or sodium. Maintain a healthy weight Body mass index (BMI) is used to identify weight problems. It estimates body fat based on height and weight. Your health care provider can help determine your BMI and help you achieve or maintain a healthy weight. Get regular exercise Get regular exercise. This is one of the most important things you can do for your health. Most adults should:  Exercise for at least 150 minutes each week. The exercise should increase your heart rate and make you sweat (moderate-intensity exercise).  Do strengthening exercises at least twice a week. This is in addition to the moderate-intensity exercise.  Spend less time sitting. Even light physical activity can be beneficial. Watch cholesterol and blood lipids Have your blood tested for lipids and cholesterol at 45 years of age, then have this test every 5 years. Have your cholesterol levels checked more often if:  Your lipid or cholesterol levels are high.  You are older than 45 years of age.  You are at high risk for heart disease. What should I know about cancer screening? Depending on your health history and family history, you may need to have cancer screening at various ages. This may include screening for:  Breast cancer.  Cervical cancer.  Colorectal cancer.  Skin cancer.  Lung cancer. What should I know about heart disease, diabetes, and high blood  pressure? Blood pressure and heart disease  High blood pressure causes heart disease and increases the risk of stroke. This is more likely to develop in people who have high blood pressure readings, are of African descent, or are overweight.  Have your blood pressure checked: ? Every 3-5 years if you are 18-39 years of age. ? Every year if you are 40 years old or older. Diabetes Have regular diabetes screenings. This checks your fasting blood sugar level. Have the screening done:  Once every three years after age 40 if you are at a normal weight and have a low risk for diabetes.  More often and at a younger age if you are overweight or have a high risk for diabetes. What should I know about preventing infection? Hepatitis B If you have a higher risk for hepatitis B, you should be screened for this virus. Talk with your health care provider to find out if you are at risk for hepatitis B infection. Hepatitis C Testing is recommended for:  Everyone born from 1945 through 1965.  Anyone with known risk factors for hepatitis C. Sexually transmitted infections (STIs)  Get screened for STIs, including gonorrhea and chlamydia, if: ? You are sexually active and are younger than 45 years of age. ? You are older than 45 years of age and your health care provider tells you that you are at risk for this type of infection. ? Your sexual activity has changed since you were last screened, and you are at increased risk for chlamydia or gonorrhea. Ask your health care provider if   you are at risk.  Ask your health care provider about whether you are at high risk for HIV. Your health care provider may recommend a prescription medicine to help prevent HIV infection. If you choose to take medicine to prevent HIV, you should first get tested for HIV. You should then be tested every 3 months for as long as you are taking the medicine. Pregnancy  If you are about to stop having your period (premenopausal) and  you may become pregnant, seek counseling before you get pregnant.  Take 400 to 800 micrograms (mcg) of folic acid every day if you become pregnant.  Ask for birth control (contraception) if you want to prevent pregnancy. Osteoporosis and menopause Osteoporosis is a disease in which the bones lose minerals and strength with aging. This can result in bone fractures. If you are 65 years old or older, or if you are at risk for osteoporosis and fractures, ask your health care provider if you should:  Be screened for bone loss.  Take a calcium or vitamin D supplement to lower your risk of fractures.  Be given hormone replacement therapy (HRT) to treat symptoms of menopause. Follow these instructions at home: Lifestyle  Do not use any products that contain nicotine or tobacco, such as cigarettes, e-cigarettes, and chewing tobacco. If you need help quitting, ask your health care provider.  Do not use street drugs.  Do not share needles.  Ask your health care provider for help if you need support or information about quitting drugs. Alcohol use  Do not drink alcohol if: ? Your health care provider tells you not to drink. ? You are pregnant, may be pregnant, or are planning to become pregnant.  If you drink alcohol: ? Limit how much you use to 0-1 drink a day. ? Limit intake if you are breastfeeding.  Be aware of how much alcohol is in your drink. In the [place] one drink equals one 12 oz bottle of beer (355 mL), one 5 oz glass of wine (148 mL), or one 1 oz glass of hard liquor (44 mL). General instructions  Schedule regular health, dental, and eye exams.  Stay current with your vaccines.  Tell your health care provider if: ? You often feel depressed. ? You have ever been abused or do not feel safe at home. Summary  Adopting a healthy lifestyle and getting preventive care are important in promoting health and wellness.  Follow your health care provider's instructions about healthy  diet, exercising, and getting tested or screened for diseases.  Follow your health care provider's instructions on monitoring your cholesterol and blood pressure. This information is not intended to replace advice given to you by your health care provider. Make sure you discuss any questions you have with your health care provider. Document Revised: 03/16/2018 Document Reviewed: 03/16/2018 Elsevier Patient Education  2020 [employer]  

## 2020-01-17 DIAGNOSIS — M2021 Hallux rigidus, right foot: Secondary | ICD-10-CM | POA: Diagnosis not present

## 2020-01-20 DIAGNOSIS — Z20822 Contact with and (suspected) exposure to covid-19: Secondary | ICD-10-CM | POA: Diagnosis not present

## 2020-01-27 DIAGNOSIS — Z20822 Contact with and (suspected) exposure to covid-19: Secondary | ICD-10-CM | POA: Diagnosis not present

## 2020-02-03 DIAGNOSIS — Z20822 Contact with and (suspected) exposure to covid-19: Secondary | ICD-10-CM | POA: Diagnosis not present

## 2020-02-11 DIAGNOSIS — Z20822 Contact with and (suspected) exposure to covid-19: Secondary | ICD-10-CM | POA: Diagnosis not present

## 2020-02-13 DIAGNOSIS — Z20822 Contact with and (suspected) exposure to covid-19: Secondary | ICD-10-CM | POA: Diagnosis not present

## 2020-02-14 ENCOUNTER — Other Ambulatory Visit: Payer: Self-pay | Admitting: Internal Medicine

## 2020-02-14 DIAGNOSIS — I10 Essential (primary) hypertension: Secondary | ICD-10-CM

## 2020-02-18 DIAGNOSIS — Z20822 Contact with and (suspected) exposure to covid-19: Secondary | ICD-10-CM | POA: Diagnosis not present

## 2020-02-25 DIAGNOSIS — Z20822 Contact with and (suspected) exposure to covid-19: Secondary | ICD-10-CM | POA: Diagnosis not present

## 2020-03-02 DIAGNOSIS — Z20822 Contact with and (suspected) exposure to covid-19: Secondary | ICD-10-CM | POA: Diagnosis not present

## 2020-03-11 DIAGNOSIS — Z20822 Contact with and (suspected) exposure to covid-19: Secondary | ICD-10-CM | POA: Diagnosis not present

## 2020-03-16 DIAGNOSIS — Z20822 Contact with and (suspected) exposure to covid-19: Secondary | ICD-10-CM | POA: Diagnosis not present

## 2020-03-23 DIAGNOSIS — Z20822 Contact with and (suspected) exposure to covid-19: Secondary | ICD-10-CM | POA: Diagnosis not present

## 2020-04-12 DIAGNOSIS — Z20822 Contact with and (suspected) exposure to covid-19: Secondary | ICD-10-CM | POA: Diagnosis not present

## 2020-04-24 DIAGNOSIS — Z20822 Contact with and (suspected) exposure to covid-19: Secondary | ICD-10-CM | POA: Diagnosis not present

## 2020-04-30 ENCOUNTER — Other Ambulatory Visit: Payer: Self-pay | Admitting: Internal Medicine

## 2020-04-30 DIAGNOSIS — I10 Essential (primary) hypertension: Secondary | ICD-10-CM

## 2020-05-10 DIAGNOSIS — Z20822 Contact with and (suspected) exposure to covid-19: Secondary | ICD-10-CM | POA: Diagnosis not present

## 2020-05-14 ENCOUNTER — Other Ambulatory Visit: Payer: Self-pay

## 2020-05-14 ENCOUNTER — Encounter: Payer: Self-pay | Admitting: Internal Medicine

## 2020-05-14 ENCOUNTER — Ambulatory Visit: Payer: BC Managed Care – PPO | Admitting: Internal Medicine

## 2020-05-14 ENCOUNTER — Other Ambulatory Visit: Payer: Self-pay | Admitting: Internal Medicine

## 2020-05-14 DIAGNOSIS — I10 Essential (primary) hypertension: Secondary | ICD-10-CM

## 2020-05-14 DIAGNOSIS — E876 Hypokalemia: Secondary | ICD-10-CM

## 2020-05-14 DIAGNOSIS — R21 Rash and other nonspecific skin eruption: Secondary | ICD-10-CM | POA: Diagnosis not present

## 2020-05-14 MED ORDER — NYSTATIN-TRIAMCINOLONE 100000-0.1 UNIT/GM-% EX OINT: 1.0000 "application " | TOPICAL_OINTMENT | Freq: Two times a day (BID) | CUTANEOUS | 0 refills | Status: DC

## 2020-05-14 NOTE — Assessment & Plan Note (Signed)
Rx nystatin/triamcinolone ointment for the rash. We did talk about breast reduction and how her recurrent candidal skin infections could be related to her larger breast size.

## 2020-05-14 NOTE — Progress Notes (Signed)
   Subjective:   Patient ID: Stacey Ruiz, female    DOB: 19-Oct-1975, 45 y.o.   MRN: [id number]  HPI The patient is a 45 female coming in for rash under the breast area. Has had this before and wonders if her large breasts could be the cause. She has thought about getting breast reduction in the past and has had some back pain problems she feels may be related also. She does not want to move forward with this now but in the future she may. Does have itching with the rash. Started in the last few weeks. Denies trying anything for it. Overall worsening.   Review of Systems  Constitutional: Negative.   HENT: Negative.   Eyes: Negative.   Respiratory: Negative for cough, chest tightness and shortness of breath.   Cardiovascular: Negative for chest pain, palpitations and leg swelling.  Gastrointestinal: Negative for abdominal distention, abdominal pain, constipation, diarrhea, nausea and vomiting.  Musculoskeletal: Negative.   Skin: Positive for rash.  Neurological: Negative.   Psychiatric/Behavioral: Negative.     Objective:  Physical Exam Constitutional:      Appearance: She is well-developed and well-nourished.  HENT:     Head: Normocephalic and atraumatic.  Eyes:     Extraocular Movements: EOM normal.  Cardiovascular:     Rate and Rhythm: Normal rate and regular rhythm.  Pulmonary:     Effort: Pulmonary effort is normal. No respiratory distress.     Breath sounds: Normal breath sounds. No wheezing or rales.  Abdominal:     General: Bowel sounds are normal. There is no distension.     Palpations: Abdomen is soft.     Tenderness: There is no abdominal tenderness. There is no rebound.  Musculoskeletal:        General: No edema.     Cervical back: Normal range of motion.  Skin:    General: Skin is warm and dry.     Findings: Rash present.     Comments: Candidal skin infection under both breasts bilaterally  Neurological:     Mental Status: She is alert and oriented to  person, place, and time.     Coordination: Coordination normal.  Psychiatric:        Mood and Affect: Mood and affect normal.     Vitals:   05/14/20 1315  BP: 124/74  Pulse: 75  Resp: 18  Temp: 98.3 F (36.8 C)  TempSrc: Oral  SpO2: 100%  Weight: 196 lb 3.2 oz (89 kg)  Height: 5\' 5"  (1.651 m)    This visit occurred during the SARS-CoV-2 public health emergency.  Safety protocols were in place, including screening questions prior to the visit, additional usage of staff PPE, and extensive cleaning of exam room while observing appropriate contact time as indicated for disinfecting solutions.   Assessment & Plan:

## 2020-05-14 NOTE — Telephone Encounter (Signed)
Ok forward to pcp 

## 2020-05-14 NOTE — Patient Instructions (Addendum)
We have sent in the cream to use twice a day for the rash.   If you are interested in the breast reduction talking to a plastic surgeon is the first step.

## 2020-05-17 ENCOUNTER — Encounter: Payer: Self-pay | Admitting: Internal Medicine

## 2020-05-17 MED ORDER — NYSTATIN 100000 UNIT/GM EX CREA: 1.0000 "application " | TOPICAL_CREAM | Freq: Two times a day (BID) | CUTANEOUS | 0 refills | Status: DC

## 2020-05-17 MED ORDER — TRIAMCINOLONE ACETONIDE 0.1 % EX CREA: 1.0000 "application " | TOPICAL_CREAM | Freq: Two times a day (BID) | CUTANEOUS | 0 refills | Status: DC

## 2020-05-25 DIAGNOSIS — Z20822 Contact with and (suspected) exposure to covid-19: Secondary | ICD-10-CM | POA: Diagnosis not present

## 2020-06-06 ENCOUNTER — Other Ambulatory Visit: Payer: Self-pay | Admitting: Internal Medicine

## 2020-06-06 DIAGNOSIS — Z1231 Encounter for screening mammogram for malignant neoplasm of breast: Secondary | ICD-10-CM

## 2020-06-08 DIAGNOSIS — Z20822 Contact with and (suspected) exposure to covid-19: Secondary | ICD-10-CM | POA: Diagnosis not present

## 2020-06-24 ENCOUNTER — Other Ambulatory Visit (HOSPITAL_COMMUNITY)
Admission: RE | Admit: 2020-06-24 | Discharge: 2020-06-24 | Disposition: A | Discharge status: Home / Self Care | Payer: BC Managed Care – PPO | Source: Ambulatory Visit | Attending: Obstetrics | Admitting: Obstetrics

## 2020-06-24 ENCOUNTER — Other Ambulatory Visit: Payer: Self-pay

## 2020-06-24 ENCOUNTER — Ambulatory Visit (INDEPENDENT_AMBULATORY_CARE_PROVIDER_SITE_OTHER): Payer: BC Managed Care – PPO | Admitting: Obstetrics

## 2020-06-24 ENCOUNTER — Encounter: Payer: Self-pay | Admitting: Obstetrics

## 2020-06-24 VITALS — BP 123/82 | HR 96 | Ht 65.0 in | Wt 191.0 lb

## 2020-06-24 DIAGNOSIS — B9689 Other specified bacterial agents as the cause of diseases classified elsewhere: Secondary | ICD-10-CM | POA: Insufficient documentation

## 2020-06-24 DIAGNOSIS — Z01419 Encounter for gynecological examination (general) (routine) without abnormal findings: Secondary | ICD-10-CM

## 2020-06-24 DIAGNOSIS — N898 Other specified noninflammatory disorders of vagina: Secondary | ICD-10-CM | POA: Insufficient documentation

## 2020-06-24 DIAGNOSIS — R87810 Cervical high risk human papillomavirus (HPV) DNA test positive: Secondary | ICD-10-CM | POA: Diagnosis not present

## 2020-06-24 DIAGNOSIS — Z1151 Encounter for screening for human papillomavirus (HPV): Secondary | ICD-10-CM | POA: Diagnosis not present

## 2020-06-24 DIAGNOSIS — Z113 Encounter for screening for infections with a predominantly sexual mode of transmission: Secondary | ICD-10-CM | POA: Insufficient documentation

## 2020-06-24 DIAGNOSIS — N760 Acute vaginitis: Secondary | ICD-10-CM | POA: Diagnosis not present

## 2020-06-24 NOTE — Progress Notes (Signed)
Pt states that she has been having some hormonal changes.

## 2020-06-24 NOTE — Progress Notes (Addendum)
Subjective:        Stacey Ruiz is a 45 y.o. female here for a routine exam.  Current complaints: Irregular cycles and vaginal discharge.    Personal health questionnaire:  Is patient Ashkenazi Jewish, have a family history of breast and/or ovarian cancer: great aunt with breast CA Is there a family history of uterine cancer diagnosed at age < 75, gastrointestinal cancer, urinary tract cancer, family member who is a Field seismologist syndrome-associated carrier: yes, mother with colon CA Is the patient overweight and hypertensive, family history of diabetes, personal history of gestational diabetes, preeclampsia or PCOS: no Is patient over 3, have PCOS,  family history of premature CHD under age 67, diabetes, smoke, have hypertension or peripheral artery disease:  no At any time, has a partner hit, kicked or otherwise hurt or frightened you?: no Over the past 2 weeks, have you felt down, depressed or hopeless?: no Over the past 2 weeks, have you felt little interest or pleasure in doing things?:no   Gynecologic History No LMP recorded. (Menstrual status: Other). Contraception: tubal ligation Last Pap: 2019. Results were: normal Last mammogram: 2021. Results were: normal  Obstetric History OB History  Gravida Para Term Preterm AB Living  1            SAB IAB Ectopic Multiple Live Births               # Outcome Date GA Lbr Len/2nd Weight Sex Delivery Anes PTL Lv  1 Gravida              Birth Comments: System Generated. Please review and update pregnancy details.    Past Medical History:  Diagnosis Date  . Anxiety   . Depression   . Headache(784.0)    migraines  . Hypertension     Past Surgical History:  Procedure Laterality Date  . ABLATION    . FRACTURE SURGERY  2002   left wrist  . TUBAL LIGATION  11/28/2010   Procedure: ESSURE TUBAL STERILIZATION;  Surgeon: Agnes Lawrence, MD;  Location: [facility];  Service: Gynecology;  Laterality: N/A;  Attempted essure  sterilization. Essure device placed in leftt side only. could not do procedure on right fallopian tube.   . TUBAL LIGATION  12/05/2010   Procedure: ESSURE TUBAL STERILIZATION;  Surgeon: Agnes Lawrence, MD;  Location: [facility];  Service: Gynecology;  Laterality: Right;     Current Outpatient Medications:  .  amLODipine (NORVASC) 10 MG tablet, TAKE 1 TABLET(10 MG) BY MOUTH DAILY, Disp: 90 tablet, Rfl: 1 .  phentermine 37.5 MG capsule, Take 1 capsule (37.5 mg total) by mouth every morning., Disp: 30 capsule, Rfl: 2 .  triamterene-hydrochlorothiazide (DYAZIDE) 37.5-25 MG capsule, TAKE 1 CAPSULE BY MOUTH DAILY, Disp: 90 capsule, Rfl: 0 .  diclofenac sodium (VOLTAREN) 1 % GEL, Apply 4 g topically 4 (four) times daily. (Patient not taking: Reported on 05/14/2020), Disp: 100 g, Rfl: 0 .  escitalopram (LEXAPRO) 20 MG tablet, Take 1 tablet (20 mg total) by mouth daily., Disp: 90 tablet, Rfl: 1 .  ibuprofen (ADVIL) 600 MG tablet, TAKE 1 TABLET(600 MG) BY MOUTH EVERY 8 HOURS AS NEEDED, Disp: 90 tablet, Rfl: 1 .  nystatin cream (MYCOSTATIN), Apply 1 application topically 2 (two) times daily., Disp: 100 g, Rfl: 0 .  nystatin-triamcinolone ointment (MYCOLOG), Apply 1 application topically 2 (two) times daily., Disp: 100 g, Rfl: 0 .  triamcinolone (KENALOG) 0.1 %, Apply 1 application topically 2 (two) times daily., Disp: 100  g, Rfl: 0 Allergies  Allergen Reactions  . Hydrocodone Itching and Nausea And Vomiting  . Oxycodone Itching and Nausea And Vomiting    Social History   Tobacco Use  . Smoking status: Former Smoker    Types: Cigarettes    Quit date: 11/12/2007    Years since quitting: 12.6  . Smokeless tobacco: Never Used  Substance Use Topics  . Alcohol use: Yes    Alcohol/week: 7.0 standard drinks    Types: 7 Glasses of wine per week    Comment: occ    Family History  Problem Relation Age of Onset  . Cancer Mother   . Breast cancer Maternal Aunt 1  . Diabetes Neg Hx   . Early death Neg Hx    . Heart disease Neg Hx   . Hyperlipidemia Neg Hx   . Hypertension Neg Hx   . Kidney disease Neg Hx   . Stroke Neg Hx       Review of Systems  Constitutional: negative for fatigue and weight loss Respiratory: negative for cough and wheezing Cardiovascular: negative for chest pain, fatigue and palpitations Gastrointestinal: negative for abdominal pain and change in bowel habits Musculoskeletal:negative for myalgias Neurological: negative for gait problems and tremors Behavioral/Psych: negative for abusive relationship, depression Endocrine: negative for temperature intolerance    Genitourinary:negative for abnormal menstrual periods, genital lesions, hot flashes, sexual problems and vaginal discharge Integument/breast: negative for breast lump, breast tenderness, nipple discharge and skin lesion(s)    Objective:       BP 123/82   Pulse 96   Ht 5\' 5"  (1.651 m)   Wt 191 lb (86.6 kg)   BMI 31.78 kg/m  General:   alert and no distress  Skin:   no rash or abnormalities  Lungs:   clear to auscultation bilaterally  Heart:   regular rate and rhythm, S1, S2 normal, no murmur, click, rub or gallop  Breasts:   normal without suspicious masses, skin or nipple changes or axillary nodes  Abdomen:  normal findings: no organomegaly, soft, non-tender and no hernia  Pelvis:  External genitalia: normal general appearance Urinary system: urethral meatus normal and bladder without fullness, nontender Vaginal: normal without tenderness, induration or masses Cervix: normal appearance Adnexa: normal bimanual exam Uterus: anteverted and non-tender, normal size   Lab Review Urine pregnancy test Labs reviewed yes Radiologic studies reviewed yes  I have spent a total of 20 minutes of face-to-face and time, excluding clinical staff time, reviewing notes and preparing to see patient, ordering tests and/or medications, and counseling the patient.  Assessment:     1. Encounter for gynecological  examination with Papanicolaou smear of cervix Rx: - Cytology - PAP( [place])  2. Vaginal discharge Rx: - Cervicovaginal ancillary only( [place])  3. Screening for STDs (sexually transmitted diseases) Rx: - RPR+HBsAg+HCVAb+...    Plan:    Education reviewed: calcium supplements, depression evaluation, low fat, low cholesterol diet, safe sex/STD prevention, self breast exams and weight bearing exercise. Follow up in: 1 year.    Orders Placed This Encounter  Procedures  . RPR+HBsAg+HCVAb+...    Shelly Bombard, MD 06/24/2020 1:27 PM

## 2020-06-25 LAB — RPR+HBSAG+HCVAB+...
HIV Screen 4th Generation wRfx: NONREACTIVE
Hep C Virus Ab: 0.1 s/co ratio (ref 0.0–0.9)
Hepatitis B Surface Ag: NEGATIVE
RPR Ser Ql: NONREACTIVE

## 2020-06-25 LAB — CERVICOVAGINAL ANCILLARY ONLY
Bacterial Vaginitis (gardnerella): POSITIVE — AB
Candida Glabrata: NEGATIVE
Candida Vaginitis: NEGATIVE
Chlamydia: NEGATIVE
Comment: NEGATIVE
Comment: NEGATIVE
Comment: NEGATIVE
Comment: NEGATIVE
Comment: NEGATIVE
Comment: NORMAL
Neisseria Gonorrhea: NEGATIVE
Trichomonas: NEGATIVE

## 2020-06-25 LAB — CYTOLOGY - PAP
Comment: NEGATIVE
Diagnosis: NEGATIVE
High risk HPV: POSITIVE — AB

## 2020-06-26 ENCOUNTER — Other Ambulatory Visit: Payer: Self-pay | Admitting: Obstetrics

## 2020-06-26 DIAGNOSIS — Z03818 Encounter for observation for suspected exposure to other biological agents ruled out: Secondary | ICD-10-CM | POA: Diagnosis not present

## 2020-06-26 DIAGNOSIS — B9689 Other specified bacterial agents as the cause of diseases classified elsewhere: Secondary | ICD-10-CM

## 2020-06-26 DIAGNOSIS — Z20822 Contact with and (suspected) exposure to covid-19: Secondary | ICD-10-CM | POA: Diagnosis not present

## 2020-06-26 MED ORDER — METRONIDAZOLE 500 MG PO TABS: 500.0000 mg | ORAL_TABLET | Freq: Two times a day (BID) | ORAL | 2 refills | Status: DC

## 2020-07-07 DIAGNOSIS — Z20822 Contact with and (suspected) exposure to covid-19: Secondary | ICD-10-CM | POA: Diagnosis not present

## 2020-07-07 DIAGNOSIS — Z03818 Encounter for observation for suspected exposure to other biological agents ruled out: Secondary | ICD-10-CM | POA: Diagnosis not present

## 2020-07-22 ENCOUNTER — Telehealth: Payer: Self-pay | Admitting: Obstetrics and Gynecology

## 2020-07-22 MED ORDER — FLUCONAZOLE 150 MG PO TABS: 150.0000 mg | ORAL_TABLET | Freq: Once | ORAL | 0 refills | Status: AC

## 2020-07-22 NOTE — Telephone Encounter (Signed)
Patient called requesting Rx be sent in to her pharmacy for yeast.   Rx routed per protocol.

## 2020-07-24 ENCOUNTER — Other Ambulatory Visit: Payer: Self-pay | Admitting: Internal Medicine

## 2020-07-24 ENCOUNTER — Telehealth: Payer: Self-pay | Admitting: *Deleted

## 2020-07-24 DIAGNOSIS — E669 Obesity, unspecified: Secondary | ICD-10-CM

## 2020-07-24 NOTE — Telephone Encounter (Signed)
Spoke with pt about recent results and Rx that were given.  Advised to repeat pap next year.

## 2020-07-25 DIAGNOSIS — M9903 Segmental and somatic dysfunction of lumbar region: Secondary | ICD-10-CM | POA: Diagnosis not present

## 2020-07-25 DIAGNOSIS — M62830 Muscle spasm of back: Secondary | ICD-10-CM | POA: Diagnosis not present

## 2020-07-25 DIAGNOSIS — M9905 Segmental and somatic dysfunction of pelvic region: Secondary | ICD-10-CM | POA: Diagnosis not present

## 2020-07-25 DIAGNOSIS — M9904 Segmental and somatic dysfunction of sacral region: Secondary | ICD-10-CM | POA: Diagnosis not present

## 2020-07-26 DIAGNOSIS — M9904 Segmental and somatic dysfunction of sacral region: Secondary | ICD-10-CM | POA: Diagnosis not present

## 2020-07-26 DIAGNOSIS — M9903 Segmental and somatic dysfunction of lumbar region: Secondary | ICD-10-CM | POA: Diagnosis not present

## 2020-07-26 DIAGNOSIS — M9905 Segmental and somatic dysfunction of pelvic region: Secondary | ICD-10-CM | POA: Diagnosis not present

## 2020-07-26 DIAGNOSIS — M62830 Muscle spasm of back: Secondary | ICD-10-CM | POA: Diagnosis not present

## 2020-07-29 DIAGNOSIS — M62830 Muscle spasm of back: Secondary | ICD-10-CM | POA: Diagnosis not present

## 2020-07-29 DIAGNOSIS — M9905 Segmental and somatic dysfunction of pelvic region: Secondary | ICD-10-CM | POA: Diagnosis not present

## 2020-07-29 DIAGNOSIS — M9904 Segmental and somatic dysfunction of sacral region: Secondary | ICD-10-CM | POA: Diagnosis not present

## 2020-07-29 DIAGNOSIS — M9903 Segmental and somatic dysfunction of lumbar region: Secondary | ICD-10-CM | POA: Diagnosis not present

## 2020-07-30 ENCOUNTER — Other Ambulatory Visit: Payer: Self-pay

## 2020-07-30 ENCOUNTER — Ambulatory Visit
Admission: RE | Admit: 2020-07-30 | Discharge: 2020-07-30 | Disposition: A | Discharge status: Home / Self Care | Payer: BC Managed Care – PPO | Source: Ambulatory Visit | Attending: Internal Medicine | Admitting: Internal Medicine

## 2020-07-30 DIAGNOSIS — Z1231 Encounter for screening mammogram for malignant neoplasm of breast: Secondary | ICD-10-CM

## 2020-07-30 DIAGNOSIS — M9904 Segmental and somatic dysfunction of sacral region: Secondary | ICD-10-CM | POA: Diagnosis not present

## 2020-07-30 DIAGNOSIS — M9905 Segmental and somatic dysfunction of pelvic region: Secondary | ICD-10-CM | POA: Diagnosis not present

## 2020-07-30 DIAGNOSIS — M62830 Muscle spasm of back: Secondary | ICD-10-CM | POA: Diagnosis not present

## 2020-07-30 DIAGNOSIS — M9903 Segmental and somatic dysfunction of lumbar region: Secondary | ICD-10-CM | POA: Diagnosis not present

## 2020-07-31 DIAGNOSIS — M9905 Segmental and somatic dysfunction of pelvic region: Secondary | ICD-10-CM | POA: Diagnosis not present

## 2020-07-31 DIAGNOSIS — M9904 Segmental and somatic dysfunction of sacral region: Secondary | ICD-10-CM | POA: Diagnosis not present

## 2020-07-31 DIAGNOSIS — M62830 Muscle spasm of back: Secondary | ICD-10-CM | POA: Diagnosis not present

## 2020-07-31 DIAGNOSIS — M9903 Segmental and somatic dysfunction of lumbar region: Secondary | ICD-10-CM | POA: Diagnosis not present

## 2020-08-05 DIAGNOSIS — M62830 Muscle spasm of back: Secondary | ICD-10-CM | POA: Diagnosis not present

## 2020-08-05 DIAGNOSIS — M9904 Segmental and somatic dysfunction of sacral region: Secondary | ICD-10-CM | POA: Diagnosis not present

## 2020-08-05 DIAGNOSIS — M9905 Segmental and somatic dysfunction of pelvic region: Secondary | ICD-10-CM | POA: Diagnosis not present

## 2020-08-05 DIAGNOSIS — M9903 Segmental and somatic dysfunction of lumbar region: Secondary | ICD-10-CM | POA: Diagnosis not present

## 2020-08-06 DIAGNOSIS — M9903 Segmental and somatic dysfunction of lumbar region: Secondary | ICD-10-CM | POA: Diagnosis not present

## 2020-08-06 DIAGNOSIS — M62830 Muscle spasm of back: Secondary | ICD-10-CM | POA: Diagnosis not present

## 2020-08-06 DIAGNOSIS — M9905 Segmental and somatic dysfunction of pelvic region: Secondary | ICD-10-CM | POA: Diagnosis not present

## 2020-08-06 DIAGNOSIS — M9904 Segmental and somatic dysfunction of sacral region: Secondary | ICD-10-CM | POA: Diagnosis not present

## 2020-08-07 DIAGNOSIS — M62830 Muscle spasm of back: Secondary | ICD-10-CM | POA: Diagnosis not present

## 2020-08-07 DIAGNOSIS — M9905 Segmental and somatic dysfunction of pelvic region: Secondary | ICD-10-CM | POA: Diagnosis not present

## 2020-08-07 DIAGNOSIS — M9903 Segmental and somatic dysfunction of lumbar region: Secondary | ICD-10-CM | POA: Diagnosis not present

## 2020-08-07 DIAGNOSIS — M9904 Segmental and somatic dysfunction of sacral region: Secondary | ICD-10-CM | POA: Diagnosis not present

## 2020-08-12 ENCOUNTER — Other Ambulatory Visit: Payer: Self-pay | Admitting: Internal Medicine

## 2020-08-12 DIAGNOSIS — E876 Hypokalemia: Secondary | ICD-10-CM

## 2020-08-12 DIAGNOSIS — I10 Essential (primary) hypertension: Secondary | ICD-10-CM

## 2020-10-11 ENCOUNTER — Other Ambulatory Visit: Payer: Self-pay | Admitting: Internal Medicine

## 2020-10-11 DIAGNOSIS — I10 Essential (primary) hypertension: Secondary | ICD-10-CM

## 2020-10-11 DIAGNOSIS — E876 Hypokalemia: Secondary | ICD-10-CM

## 2020-11-01 ENCOUNTER — Telehealth (INDEPENDENT_AMBULATORY_CARE_PROVIDER_SITE_OTHER): Payer: BC Managed Care – PPO | Admitting: Internal Medicine

## 2020-11-01 ENCOUNTER — Encounter: Payer: Self-pay | Admitting: Internal Medicine

## 2020-11-01 DIAGNOSIS — J069 Acute upper respiratory infection, unspecified: Secondary | ICD-10-CM | POA: Diagnosis not present

## 2020-11-01 MED ORDER — PROMETHAZINE-DM 6.25-15 MG/5ML PO SYRP
5.0000 mL | ORAL_SOLUTION | Freq: Two times a day (BID) | ORAL | 0 refills | Status: DC | PRN
Start: 2020-11-01 — End: 2021-05-08

## 2020-11-01 MED ORDER — MONTELUKAST SODIUM 10 MG PO TABS: 10.0000 mg | ORAL_TABLET | Freq: Every day | ORAL | 0 refills | Status: DC

## 2020-11-01 NOTE — Progress Notes (Signed)
Virtual Visit via Video Note  I connected with Stacey Ruiz on 11/01/20 at  8:20 AM EDT by a video enabled telemedicine application and verified that I am speaking with the correct person using two identifiers.  The patient and the provider were at separate locations throughout the entire encounter. Patient location: home, Provider location: work   I discussed the limitations of evaluation and management by telemedicine and the availability of in person appointments. The patient expressed understanding and agreed to proceed. The patient and the provider were the only parties present for the visit unless noted in HPI below.  History of Present Illness: The patient is a 45 y.o. female with visit for possible sinus infection. Last Tuesday grandson in town and he was sick. She did not get sick until Sunday with same thing. Took home covid-19 test which was negative. Took another on Tuesday and that was also negative. Having a lot of congestion. Denies SOB but is having cough. Taking otc sinus medication which has not helped.   Observations/Objective: Appearance: normal, breathing appears normal, casual grooming, abdomen does not appear distended, throat not well visualized, mental status is A and O times 3  Assessment and Plan: See problem oriented charting  Follow Up Instructions: rx singulair and promethazine/dm cough syrup  I discussed the assessment and treatment plan with the patient. The patient was provided an opportunity to ask questions and all were answered. The patient agreed with the plan and demonstrated an understanding of the instructions.   The patient was advised to call back or seek an in-person evaluation if the symptoms worsen or if the condition fails to improve as anticipated.  Hoyt Koch, MD

## 2020-11-01 NOTE — Assessment & Plan Note (Signed)
Has taken several covid-19 home tests negative. Rx singulair for sinus congestion and promethazine/dm for cough. Explained to patient that antibiotics are not indicated and typical length of symptoms are 7-10 days and she is in day 5 today.

## 2020-12-29 ENCOUNTER — Other Ambulatory Visit: Payer: Self-pay | Admitting: Internal Medicine

## 2021-01-07 ENCOUNTER — Other Ambulatory Visit: Payer: Self-pay | Admitting: Internal Medicine

## 2021-01-07 DIAGNOSIS — I10 Essential (primary) hypertension: Secondary | ICD-10-CM

## 2021-01-07 DIAGNOSIS — E876 Hypokalemia: Secondary | ICD-10-CM

## 2021-01-22 DIAGNOSIS — M1711 Unilateral primary osteoarthritis, right knee: Secondary | ICD-10-CM | POA: Diagnosis not present

## 2021-01-22 DIAGNOSIS — M25461 Effusion, right knee: Secondary | ICD-10-CM | POA: Diagnosis not present

## 2021-02-05 ENCOUNTER — Other Ambulatory Visit: Payer: Self-pay | Admitting: Sports Medicine

## 2021-02-05 DIAGNOSIS — M25561 Pain in right knee: Secondary | ICD-10-CM

## 2021-02-05 DIAGNOSIS — S83241A Other tear of medial meniscus, current injury, right knee, initial encounter: Secondary | ICD-10-CM | POA: Diagnosis not present

## 2021-02-05 DIAGNOSIS — M25461 Effusion, right knee: Secondary | ICD-10-CM | POA: Diagnosis not present

## 2021-02-05 DIAGNOSIS — M1711 Unilateral primary osteoarthritis, right knee: Secondary | ICD-10-CM | POA: Diagnosis not present

## 2021-02-23 ENCOUNTER — Other Ambulatory Visit: Payer: Self-pay

## 2021-02-23 ENCOUNTER — Ambulatory Visit
Admission: RE | Admit: 2021-02-23 | Discharge: 2021-02-23 | Disposition: A | Discharge status: Home / Self Care | Payer: BC Managed Care – PPO | Source: Ambulatory Visit | Attending: Sports Medicine | Admitting: Sports Medicine

## 2021-02-23 DIAGNOSIS — M25561 Pain in right knee: Secondary | ICD-10-CM

## 2021-02-25 DIAGNOSIS — M1711 Unilateral primary osteoarthritis, right knee: Secondary | ICD-10-CM | POA: Diagnosis not present

## 2021-03-04 DIAGNOSIS — M1711 Unilateral primary osteoarthritis, right knee: Secondary | ICD-10-CM | POA: Diagnosis not present

## 2021-03-11 DIAGNOSIS — M1711 Unilateral primary osteoarthritis, right knee: Secondary | ICD-10-CM | POA: Diagnosis not present

## 2021-03-27 ENCOUNTER — Encounter: Payer: Self-pay | Admitting: Family Medicine

## 2021-03-27 ENCOUNTER — Other Ambulatory Visit: Payer: Self-pay

## 2021-03-27 ENCOUNTER — Telehealth (INDEPENDENT_AMBULATORY_CARE_PROVIDER_SITE_OTHER): Payer: BC Managed Care – PPO | Admitting: Family Medicine

## 2021-03-27 DIAGNOSIS — R0981 Nasal congestion: Secondary | ICD-10-CM

## 2021-03-27 NOTE — Progress Notes (Signed)
Virtual Visit via Telephone Note Call started  via video however unable to hear pt.  Switched to phone call. I connected with Stacey Ruiz on 03/27/21 at  4:00 PM EST by telephone and verified that I am speaking with the correct person using two identifiers.   I discussed the limitations, risks, security and privacy concerns of performing an evaluation and management service by telephone and the availability of in person appointments. I also discussed with the patient that there may be a patient responsible charge related to this service. The patient expressed understanding and agreed to proceed.  Location patient: home Location provider: work or home office Participants present for the call: patient, provider Patient did not have a visit in the prior 7 days to address this/these issue(s).  Chief Complaint  Patient presents with   Sinus Problem    History of Present Illness: Pt is a 45 yo female with pmh sig for HTN, hot flashes, history of depression, history of vitamin D deficiency, anxiety with flying was followed by Dr. Ronnald Ramp and seen for acute concern.  Pt with nasal congestion since last night.  Denies cough, fever, HA, ear pain pressure, sore throat, diarrhea, n/v.  Appetite is good.  Taking sudafed, Flonase, using Vicks VapoRub.  Pt sleeping with a fan on 2/2 hot flashes. Of note had a cold few wks ago that resolved.   Pt only taking bp meds and vitamins.  Patient does not like taking medication.  Observations/Objective: Patient sounds cheerful and well on the phone. I do not appreciate any SOB. Speech and thought processing are grossly intact. Patient reported vitals:  Assessment and Plan: Nasal congestion OTC allergy meds.  Flonase nasal spray or saline nasal rinse.  Okay to continue Sudafed, however patient advised may notice increase in BP with decongestant medications.  Continue other supportive care.  Advised to consider taking a COVID test.  Given  precautions  Follow Up Instructions: Follow-up with PCP as needed  [id number] [id number] [id number] 11-20 [id number] 21-30 I did not refer this patient for an OV in the next 24 hours for this/these issue(s).  I discussed the assessment and treatment plan with the patient. The patient was provided an opportunity to ask questions and all were answered. The patient agreed with the plan and demonstrated an understanding of the instructions.   The patient was advised to call back or seek an in-person evaluation if the symptoms worsen or if the condition fails to improve as anticipated.  I provided 10:23 minutes of non-face-to-face time during this encounter.   [name], MD

## 2021-05-05 ENCOUNTER — Other Ambulatory Visit: Payer: Self-pay

## 2021-05-05 ENCOUNTER — Emergency Department (HOSPITAL_COMMUNITY)
Admission: EM | Admit: 2021-05-05 | Discharge: 2021-05-06 | Disposition: A | Discharge status: Home / Self Care | Payer: BC Managed Care – PPO | Attending: Emergency Medicine | Admitting: Emergency Medicine

## 2021-05-05 ENCOUNTER — Other Ambulatory Visit: Payer: Self-pay | Admitting: Internal Medicine

## 2021-05-05 ENCOUNTER — Encounter (HOSPITAL_COMMUNITY): Payer: Self-pay | Admitting: Emergency Medicine

## 2021-05-05 DIAGNOSIS — I10 Essential (primary) hypertension: Secondary | ICD-10-CM | POA: Diagnosis not present

## 2021-05-05 DIAGNOSIS — R000 Tachycardia, unspecified: Secondary | ICD-10-CM | POA: Insufficient documentation

## 2021-05-05 DIAGNOSIS — R0902 Hypoxemia: Secondary | ICD-10-CM | POA: Diagnosis not present

## 2021-05-05 DIAGNOSIS — N9489 Other specified conditions associated with female genital organs and menstrual cycle: Secondary | ICD-10-CM | POA: Insufficient documentation

## 2021-05-05 DIAGNOSIS — Z20822 Contact with and (suspected) exposure to covid-19: Secondary | ICD-10-CM | POA: Insufficient documentation

## 2021-05-05 DIAGNOSIS — I4891 Unspecified atrial fibrillation: Secondary | ICD-10-CM | POA: Diagnosis not present

## 2021-05-05 DIAGNOSIS — E876 Hypokalemia: Secondary | ICD-10-CM

## 2021-05-05 DIAGNOSIS — R112 Nausea with vomiting, unspecified: Secondary | ICD-10-CM | POA: Diagnosis not present

## 2021-05-05 DIAGNOSIS — Z79899 Other long term (current) drug therapy: Secondary | ICD-10-CM | POA: Insufficient documentation

## 2021-05-05 DIAGNOSIS — I517 Cardiomegaly: Secondary | ICD-10-CM | POA: Diagnosis not present

## 2021-05-05 DIAGNOSIS — R1110 Vomiting, unspecified: Secondary | ICD-10-CM | POA: Diagnosis not present

## 2021-05-05 DIAGNOSIS — M47816 Spondylosis without myelopathy or radiculopathy, lumbar region: Secondary | ICD-10-CM | POA: Diagnosis not present

## 2021-05-05 DIAGNOSIS — E669 Obesity, unspecified: Secondary | ICD-10-CM

## 2021-05-05 LAB — CBC WITH DIFFERENTIAL/PLATELET
Abs Immature Granulocytes: 0.05 10*3/uL (ref 0.00–0.07)
Basophils Absolute: 0.1 10*3/uL (ref 0.0–0.1)
Basophils Relative: 0 %
Eosinophils Absolute: 0.1 10*3/uL (ref 0.0–0.5)
Eosinophils Relative: 1 %
HCT: 46.4 % — ABNORMAL HIGH (ref 36.0–46.0)
Hemoglobin: 16.2 g/dL — ABNORMAL HIGH (ref 12.0–15.0)
Immature Granulocytes: 0 %
Lymphocytes Relative: 22 %
Lymphs Abs: 2.5 10*3/uL (ref 0.7–4.0)
MCH: 29.8 pg (ref 26.0–34.0)
MCHC: 34.9 g/dL (ref 30.0–36.0)
MCV: 85.3 fL (ref 80.0–100.0)
Monocytes Absolute: 0.7 10*3/uL (ref 0.1–1.0)
Monocytes Relative: 6 %
Neutro Abs: 8 10*3/uL — ABNORMAL HIGH (ref 1.7–7.7)
Neutrophils Relative %: 71 %
Platelets: 389 10*3/uL (ref 150–400)
RBC: 5.44 MIL/uL — ABNORMAL HIGH (ref 3.87–5.11)
RDW: 12.7 % (ref 11.5–15.5)
WBC: 11.3 10*3/uL — ABNORMAL HIGH (ref 4.0–10.5)
nRBC: 0 % (ref 0.0–0.2)

## 2021-05-05 LAB — I-STAT BETA HCG BLOOD, ED (MC, WL, AP ONLY): I-stat hCG, quantitative: <5 m[IU]/mL (ref <5)

## 2021-05-05 MED ORDER — ONDANSETRON HCL 4 MG/2ML IJ SOLN
INTRAMUSCULAR | Status: AC
  Filled 2021-05-05: qty 2

## 2021-05-05 MED ORDER — SODIUM CHLORIDE 0.9 % IV BOLUS
1000.0000 mL | Freq: Once | INTRAVENOUS | Status: AC
  Administered 2021-05-05: 1000 mL via INTRAVENOUS

## 2021-05-05 MED ORDER — ONDANSETRON HCL 4 MG/2ML IJ SOLN
4.0000 mg | Freq: Once | INTRAMUSCULAR | Status: AC
  Administered 2021-05-05: 4 mg via INTRAVENOUS

## 2021-05-05 NOTE — ED Notes (Signed)
Pt also co nausea/vomiting x 1 day.

## 2021-05-05 NOTE — ED Triage Notes (Signed)
Pt BIB [facility] from home, initially called EMS for HTN, out of meds x 2 days. Noted to be in new onset afib, rate 150-200s.

## 2021-05-05 NOTE — ED Provider Notes (Signed)
[facility] EMERGENCY DEPARTMENT Provider Note   CSN: [id number] Arrival date & time: 05/05/21  2248     History  Chief Complaint  Patient presents with   Atrial Fibrillation    Stacey Ruiz is a 46 y.o. female.  Patient presents to the emergency department by ambulance from home.  Patient initially called EMS because of nausea, vomiting and elevated blood pressure.  Patient has been out of her blood pressure medications for approximately a week.  She reports that she has had nausea and vomiting for the last several days, has not been able to hold anything down.  She denies any other flu symptoms.  Denies diarrhea, does state that her stools are always "soft" but unchanged.  There is no associated abdominal pain.  Patient has not experienced any chest pain.  She was noted to have narrow complex tachycardia by EMS during transport.  Patient actively vomiting on arrival, converted to sinus rhythm with vomiting upon arrival.      Home Medications Prior to Admission medications   Medication Sig Start Date End Date Taking? Authorizing Provider  amLODipine (NORVASC) 10 MG tablet TAKE 1 TABLET(10 MG) BY MOUTH DAILY 04/30/20   Janith Lima, MD  diclofenac sodium (VOLTAREN) 1 % GEL Apply 4 g topically 4 (four) times daily. Patient not taking: Reported on 05/14/2020 12/17/18   Malvin Johns, MD  escitalopram (LEXAPRO) 20 MG tablet Take 1 tablet (20 mg total) by mouth daily. 12/27/18   Janith Lima, MD  ibuprofen (ADVIL) 600 MG tablet TAKE 1 TABLET(600 MG) BY MOUTH EVERY 8 HOURS AS NEEDED 05/30/19   Janith Lima, MD  metroNIDAZOLE (FLAGYL) 500 MG tablet Take 1 tablet (500 mg total) by mouth 2 (two) times daily. 06/26/20   Shelly Bombard, MD  montelukast (SINGULAIR) 10 MG tablet TAKE 1 TABLET(10 MG) BY MOUTH AT BEDTIME 01/01/21   Hoyt Koch, MD  nystatin cream (MYCOSTATIN) Apply 1 application topically 2 (two) times daily. 05/17/20   Hoyt Koch, MD   nystatin-triamcinolone ointment [facility]) Apply 1 application topically 2 (two) times daily. 05/14/20   Hoyt Koch, MD  phentermine 37.5 MG capsule TAKE 1 CAPSULE(37.5 MG) BY MOUTH EVERY MORNING 07/24/20   Janith Lima, MD  promethazine-dextromethorphan (PROMETHAZINE-DM) 6.25-15 MG/5ML syrup Take 5 mLs by mouth 2 (two) times daily as needed for cough. 11/01/20   Hoyt Koch, MD  triamcinolone (KENALOG) 0.1 % Apply 1 application topically 2 (two) times daily. 05/17/20   Hoyt Koch, MD  triamterene-hydrochlorothiazide (DYAZIDE) 37.5-25 MG capsule TAKE 1 CAPSULE BY MOUTH DAILY 05/05/21   Janith Lima, MD      Allergies    Hydrocodone and Oxycodone    Review of Systems   Review of Systems  Gastrointestinal:  Positive for nausea and vomiting.   Physical Exam Updated Vital Signs BP (!) 120/99    Pulse (!) 58    Resp (!) 34    SpO2 (!) 13%  Physical Exam Vitals and nursing note reviewed.  Constitutional:      General: She is not in acute distress.    Appearance: She is well-developed.  HENT:     Head: Normocephalic and atraumatic.  Eyes:     Conjunctiva/sclera: Conjunctivae normal.  Cardiovascular:     Rate and Rhythm: Regular rhythm. Tachycardia present.     Heart sounds: No murmur heard. Pulmonary:     Effort: Pulmonary effort is normal. No respiratory distress.  Breath sounds: Normal breath sounds.  Abdominal:     Palpations: Abdomen is soft.     Tenderness: There is no abdominal tenderness.  Musculoskeletal:        General: No swelling.     Cervical back: Neck supple.  Skin:    General: Skin is warm and dry.     Capillary Refill: Capillary refill takes less than 2 seconds.  Neurological:     Mental Status: She is alert.  Psychiatric:        Mood and Affect: Mood normal.    ED Results / Procedures / Treatments   Labs (all labs ordered are listed, but only abnormal results are displayed) Labs Reviewed  CBC WITH DIFFERENTIAL/PLATELET -  Abnormal; Notable for the following components:      Result Value   WBC 11.3 (*)    RBC 5.44 (*)    Hemoglobin 16.2 (*)    HCT 46.4 (*)    Neutro Abs 8.0 (*)    All other components within normal limits  COMPREHENSIVE METABOLIC PANEL - Abnormal; Notable for the following components:   CO2 19 (*)    Glucose, Bld 169 (*)    BUN 5 (*)    Total Protein 9.0 (*)    Anion gap 17 (*)    All other components within normal limits  URINALYSIS, ROUTINE W REFLEX MICROSCOPIC - Abnormal; Notable for the following components:   Glucose, UA 100 (*)    Hgb urine dipstick TRACE (*)    Protein, ur 30 (*)    Leukocytes,Ua TRACE (*)    All other components within normal limits  URINALYSIS, MICROSCOPIC (REFLEX) - Abnormal; Notable for the following components:   Bacteria, UA RARE (*)    All other components within normal limits  RESP PANEL BY RT-PCR (FLU A&B, COVID) ARPGX2  LIPASE, BLOOD  MAGNESIUM  I-STAT BETA HCG BLOOD, ED ([facility], [facility], [facility] ONLY)  TROPONIN I (HIGH SENSITIVITY)  TROPONIN I (HIGH SENSITIVITY)    EKG EKG Interpretation  Date/Time:  Monday May 05 2021 23:07:05 EST Ventricular Rate:  94 PR Interval:  149 QRS Duration: 84 QT Interval:  363 QTC Calculation: 454 R Axis:   67 Text Interpretation: Sinus rhythm Normal ECG Confirmed by Orpah Greek [phone]) on 05/05/2021 11:28:51 PM  PREHOSPITAL EKG:    Radiology DG Abdomen Acute W/Chest  Result Date: 05/06/2021 CLINICAL DATA:  Vomiting EXAM: DG ABDOMEN ACUTE WITH 1 VIEW CHEST COMPARISON:  None. FINDINGS: Cardiac shadow is mildly enlarged. Lungs are hypoinflated but clear. No bony abnormality is noted. Scattered large and small bowel gas is noted. No free air is seen. Essure coils are noted bilaterally. Mild degenerative changes of the lumbar spine are noted. No soft tissue abnormality is seen. IMPRESSION: No acute abnormality noted. Electronically Signed   By: Inez Catalina M.D.   On: 05/06/2021 00:40     Procedures Procedures    Medications Ordered in ED Medications  metoCLOPramide (REGLAN) injection 10 mg (has no administration in time range)  diphenhydrAMINE (BENADRYL) injection 25 mg (has no administration in time range)  acetaminophen (TYLENOL) tablet 1,000 mg (has no administration in time range)  sodium chloride 0.9 % bolus 1,000 mL (1,000 mLs Intravenous New Bag/Given 05/05/21 2259)  ondansetron (ZOFRAN) injection 4 mg (4 mg Intravenous Given 05/05/21 2259)    ED Course/ Medical Decision Making/ A&P       CHA2DS2-VASc Score: 2  Medical Decision Making Amount and/or Complexity of Data Reviewed Labs: ordered. Radiology: ordered.  Risk OTC drugs. Prescription drug management.   Patient with past medical history of hypertension to the emergency department for elevated blood pressure in the setting of running out of her blood pressure medications.  Patient reports that she has not had her medication for about a week, was unable to get her primary care doctor to represcribe prior to an office visit.  Patient became acutely ill in the last couple of days with nausea and vomiting.  She reports that she is frequently nauseated and unclear why this happens but the vomiting is unusual for her.  Prehospital EKG showed a narrow complex irregular tachycardia.  This has been reviewed and it does look suspicious for possible atrial fibrillation.  Upon arrival to the ER, however, she had a more regular, faster narrow complex tachycardia around 200 beats a minute.  This spontaneously converted to sinus rhythm when she actively vomited.  Patient continues to be sinus rhythm on the monitor here in the department.  Differential diagnosis is atrial fibrillation, atrial flutter, sinus tachycardia, SVT.  Additionally, with acute nausea and vomiting, intra-abdominal process including small bowel obstruction, viral and bacterial infections are considered.  Lab work is  reassuring.  No electrolyte abnormalities.  She does not appear significantly volume depleted.  X-ray without signs of obstruction.  Clear lung fields.  Images independently reviewed by myself.    Abdominal exam is benign and nontender.  No focal tenderness to suggest acute intra-abdominal surgical process.  No tenderness at McBurney's point, no tenderness in the right upper quadrant, negative Murphy sign.  Prehospital and current EKG reviewed with Dr. Conley Canal, on-call for cardiology.  Unclear if she did have brief atrial fibrillation.  CHA2DS2-VASc score is 2.  Dr. Conley Canal feels that in the setting of nausea and vomiting, volume depletion, initiating anticoagulation or antiarrhythmic medications is not necessary at this time.  Recommends long-term monitoring.  Follow-up with atrial fibrillation clinic.  No antiplatelet or anticoagulation at this time.        Final Clinical Impression(s) / ED Diagnoses Final diagnoses:  Nausea and vomiting, unspecified vomiting type    Rx / DC Orders ED Discharge Orders          Ordered    Ambulatory referral to Cardiology       Comments: Atrial Fibrillation Clinic   05/06/21 0202              Orpah Greek, MD 05/06/21 [phone]

## 2021-05-06 ENCOUNTER — Emergency Department (HOSPITAL_COMMUNITY): Payer: BC Managed Care – PPO

## 2021-05-06 DIAGNOSIS — I517 Cardiomegaly: Secondary | ICD-10-CM | POA: Diagnosis not present

## 2021-05-06 DIAGNOSIS — M47816 Spondylosis without myelopathy or radiculopathy, lumbar region: Secondary | ICD-10-CM | POA: Diagnosis not present

## 2021-05-06 DIAGNOSIS — I4891 Unspecified atrial fibrillation: Secondary | ICD-10-CM | POA: Diagnosis not present

## 2021-05-06 DIAGNOSIS — R1110 Vomiting, unspecified: Secondary | ICD-10-CM | POA: Diagnosis not present

## 2021-05-06 LAB — COMPREHENSIVE METABOLIC PANEL
ALT: 19 U/L (ref 0–44)
AST: 24 U/L (ref 15–41)
Albumin: 4.8 g/dL (ref 3.5–5.0)
Alkaline Phosphatase: 89 U/L (ref 38–126)
Anion gap: 17 — ABNORMAL HIGH (ref 5–15)
BUN: 5 mg/dL — ABNORMAL LOW (ref 6–20)
CO2: 19 mmol/L — ABNORMAL LOW (ref 22–32)
Calcium: 9.8 mg/dL (ref 8.9–10.3)
Chloride: 99 mmol/L (ref 98–111)
Creatinine, Ser: 0.77 mg/dL (ref 0.44–1.00)
GFR, Estimated: >60 mL/min (ref >60)
Glucose, Bld: 169 mg/dL — ABNORMAL HIGH (ref 70–99)
Potassium: 3.8 mmol/L (ref 3.5–5.1)
Sodium: 135 mmol/L (ref 135–145)
Total Bilirubin: 0.8 mg/dL (ref 0.3–1.2)
Total Protein: 9 g/dL — ABNORMAL HIGH (ref 6.5–8.1)

## 2021-05-06 LAB — URINALYSIS, MICROSCOPIC (REFLEX)

## 2021-05-06 LAB — URINALYSIS, ROUTINE W REFLEX MICROSCOPIC
Bilirubin Urine: NEGATIVE
Glucose, UA: 100 mg/dL — AB
Ketones, ur: NEGATIVE mg/dL
Nitrite: NEGATIVE
Protein, ur: 30 mg/dL — AB
Specific Gravity, Urine: 1.015 (ref 1.005–1.030)
pH: 7 (ref 5.0–8.0)

## 2021-05-06 LAB — RESP PANEL BY RT-PCR (FLU A&B, COVID) ARPGX2
Influenza A by PCR: NEGATIVE
Influenza B by PCR: NEGATIVE
SARS Coronavirus 2 by RT PCR: NEGATIVE

## 2021-05-06 LAB — LIPASE, BLOOD: Lipase: 27 U/L (ref 11–51)

## 2021-05-06 LAB — MAGNESIUM: Magnesium: 1.9 mg/dL (ref 1.7–2.4)

## 2021-05-06 LAB — TROPONIN I (HIGH SENSITIVITY): Troponin I (High Sensitivity): 7 ng/L (ref <18)

## 2021-05-06 MED ORDER — AMLODIPINE BESYLATE 5 MG PO TABS
10.0000 mg | ORAL_TABLET | Freq: Once | ORAL | Status: AC
  Administered 2021-05-06: 10 mg via ORAL
  Filled 2021-05-06: qty 2

## 2021-05-06 MED ORDER — METOCLOPRAMIDE HCL 5 MG/ML IJ SOLN
10.0000 mg | Freq: Once | INTRAMUSCULAR | Status: AC
  Administered 2021-05-06: 10 mg via INTRAVENOUS
  Filled 2021-05-06: qty 2

## 2021-05-06 MED ORDER — ACETAMINOPHEN 500 MG PO TABS
1000.0000 mg | ORAL_TABLET | Freq: Once | ORAL | Status: AC
  Administered 2021-05-06: 1000 mg via ORAL
  Filled 2021-05-06: qty 2

## 2021-05-06 MED ORDER — TRIAMTERENE-HCTZ 37.5-25 MG PO CAPS: 1.0000 | ORAL_CAPSULE | Freq: Every day | ORAL | 1 refills | Status: DC

## 2021-05-06 MED ORDER — TRIAMTERENE-HCTZ 37.5-25 MG PO TABS: 1.0000 | ORAL_TABLET | Freq: Every day | ORAL | Status: DC

## 2021-05-06 MED ORDER — AMLODIPINE BESYLATE 10 MG PO TABS: 10.0000 mg | ORAL_TABLET | Freq: Every day | ORAL | 1 refills | Status: DC

## 2021-05-06 MED ORDER — DIPHENHYDRAMINE HCL 50 MG/ML IJ SOLN
25.0000 mg | Freq: Once | INTRAMUSCULAR | Status: AC
Start: 2021-05-06 — End: 2021-05-06
  Administered 2021-05-06: 25 mg via INTRAVENOUS
  Filled 2021-05-06: qty 1

## 2021-05-06 NOTE — Consult Note (Signed)
Cardiology Consultation:   Patient ID: Stacey Ruiz MRN: [id number]; DOB: Dec 27, 1975  Admit date: 05/05/2021 Date of Consult: 05/06/2021  PCP:  Janith Lima, MD   [facility] Providers Cardiologist:  None        Patient Profile:   Stacey Ruiz is a 46 y.o. female with a hx of obesity and HTN who is being seen 05/06/2021 for the evaluation of tachycardia at the request of Dr. Betsey Holiday.  History of Present Illness:   Ms. Fennewald reports that for the past day she has had worsening nausea and vomiting. She awoke with these symptoms. She denies having any particular trigger. She denies sick contacts, dietary changes, URI symptoms and recent travel. She further denies chest pain, SOB, abdominal pain, weakness, or numbness. Her symptoms persisted over the course of the day and crescendoed in the evening. In the setting of repeated vomiting, the patient developed acute onset palpitations that felt like her "heart was beating out of her chest." This prompted her to call EMS. When EMS arrived she was noted to be tachycardic to the 150-200s reportedly in Afib. She has no history of arrhythmias.   In the ED her VS were afebrile, HR 80, BP 151/105, RR 15 and satting 97% on RA. Labs notable for hemoconcentration but otherwise WNL. EKG showed NSR. Cardiology is consulted for eval of tachycardia.    Past Medical History:  Diagnosis Date   Anxiety    Depression    Headache(784.0)    migraines   Hypertension     Past Surgical History:  Procedure Laterality Date   ABLATION     FRACTURE SURGERY  2002   left wrist   TUBAL LIGATION  11/28/2010   Procedure: ESSURE TUBAL STERILIZATION;  Surgeon: Agnes Lawrence, MD;  Location: [facility];  Service: Gynecology;  Laterality: N/A;  Attempted essure sterilization. Essure device placed in leftt side only. could not do procedure on right fallopian tube.    TUBAL LIGATION  12/05/2010   Procedure: ESSURE TUBAL STERILIZATION;  Surgeon: Agnes Lawrence, MD;  Location: [facility];  Service: Gynecology;  Laterality: Right;     Home Medications:  Prior to Admission medications   Medication Sig Start Date End Date Taking? Authorizing Provider  amLODipine (NORVASC) 10 MG tablet Take 1 tablet (10 mg total) by mouth daily. 05/06/21   Orpah Greek, MD  diclofenac sodium (VOLTAREN) 1 % GEL Apply 4 g topically 4 (four) times daily. Patient not taking: Reported on 05/14/2020 12/17/18   Malvin Johns, MD  escitalopram (LEXAPRO) 20 MG tablet Take 1 tablet (20 mg total) by mouth daily. 12/27/18   Janith Lima, MD  ibuprofen (ADVIL) 600 MG tablet TAKE 1 TABLET(600 MG) BY MOUTH EVERY 8 HOURS AS NEEDED 05/30/19   Janith Lima, MD  metroNIDAZOLE (FLAGYL) 500 MG tablet Take 1 tablet (500 mg total) by mouth 2 (two) times daily. 06/26/20   Shelly Bombard, MD  montelukast (SINGULAIR) 10 MG tablet TAKE 1 TABLET(10 MG) BY MOUTH AT BEDTIME 01/01/21   Hoyt Koch, MD  nystatin cream (MYCOSTATIN) Apply 1 application topically 2 (two) times daily. 05/17/20   Hoyt Koch, MD  nystatin-triamcinolone ointment [facility]) Apply 1 application topically 2 (two) times daily. 05/14/20   Hoyt Koch, MD  phentermine 37.5 MG capsule TAKE 1 CAPSULE(37.5 MG) BY MOUTH EVERY MORNING 07/24/20   Janith Lima, MD  promethazine-dextromethorphan (PROMETHAZINE-DM) 6.25-15 MG/5ML syrup Take 5 mLs by mouth 2 (two) times daily  as needed for cough. 11/01/20   Hoyt Koch, MD  triamcinolone (KENALOG) 0.1 % Apply 1 application topically 2 (two) times daily. 05/17/20   Hoyt Koch, MD  triamterene-hydrochlorothiazide (DYAZIDE) 37.5-25 MG capsule Take 1 each (1 capsule total) by mouth daily. 05/06/21   Orpah Greek, MD    Inpatient Medications: Scheduled Meds:  triamterene-hydrochlorothiazide  1 tablet Oral Daily   Continuous Infusions:  PRN Meds:   Allergies:    Allergies  Allergen Reactions   Hydrocodone  Itching and Nausea And Vomiting   Oxycodone Itching and Nausea And Vomiting    Social History:   Social History   Socioeconomic History   Marital status: Widowed    Spouse name: Not on file   Number of children: Not on file   Years of education: Not on file   Highest education level: Not on file  Occupational History   Not on file  Tobacco Use   Smoking status: Former    Types: Cigarettes    Quit date: 11/12/2007    Years since quitting: 13.4   Smokeless tobacco: Never  Substance and Sexual Activity   Alcohol use: Yes    Alcohol/week: 7.0 standard drinks    Types: 7 Glasses of wine per week    Comment: occ   Drug use: Yes    Types: Marijuana    Comment: occ   Sexual activity: Never    Birth control/protection: Surgical  Other Topics Concern   Not on file  Social History Narrative   Not on file   Social Determinants of Health   Financial Resource Strain: Not on file  Food Insecurity: Not on file  Transportation Needs: Not on file  Physical Activity: Not on file  Stress: Not on file  Social Connections: Not on file  Intimate Partner Violence: Not on file    Family History:    Family History  Problem Relation Age of Onset   Cancer Mother    Breast cancer Maternal Aunt 57   Diabetes Neg Hx    Early death Neg Hx    Heart disease Neg Hx    Hyperlipidemia Neg Hx    Hypertension Neg Hx    Kidney disease Neg Hx    Stroke Neg Hx      ROS:  Please see the history of present illness.  All other ROS reviewed and negative.     Physical Exam/Data:   Vitals:   05/06/21 0305 05/06/21 0310 05/06/21 0313 05/06/21 0315  BP:  (!) 153/111 (!) 146/91 (!) 146/91  Pulse:  71 61 71  Resp:  12 14 17   Temp: (!) 97.5 F (36.4 C)     SpO2:  99% 100% 99%    Intake/Output Summary (Last 24 hours) at 05/06/2021 0626 Last data filed at 05/06/2021 0353 Gross per 24 hour  Intake 1000 ml  Output --  Net 1000 ml   Last 3 Weights 06/24/2020 05/14/2020 01/15/2020  Weight (lbs)  191 lb 196 lb 3.2 oz 195 lb  Weight (kg) 86.637 kg 88.996 kg 88.451 kg     There is no height or weight on file to calculate BMI.  General:  Well nourished, well developed, in no acute distress HEENT: normal Neck: no JVD Vascular: No carotid bruits; Distal pulses 2+ bilaterally Cardiac:  normal S1, S2; RRR; no murmur  Lungs:  clear to auscultation bilaterally, no wheezing, rhonchi or rales  Abd: soft, nontender, no hepatomegaly  Ext: no edema Musculoskeletal:  No deformities, BUE  and BLE strength normal and equal Skin: warm and dry  Neuro:  CNs 2-12 intact, no focal abnormalities noted Psych:  Normal affect   EKG:  The EKG was personally reviewed and demonstrates:  NSR   Telemetry:  Telemetry was personally reviewed and demonstrates:  NSR  Relevant CV Studies: None  Laboratory Data:  High Sensitivity Troponin:   Recent Labs  Lab 05/05/21 2305  TROPONINIHS 7     Chemistry Recent Labs  Lab 05/05/21 2305  NA 135  K 3.8  CL 99  CO2 19*  GLUCOSE 169*  BUN 5*  CREATININE 0.77  CALCIUM 9.8  MG 1.9  GFRNONAA >60  ANIONGAP 17*    Recent Labs  Lab 05/05/21 2305  PROT 9.0*  ALBUMIN 4.8  AST 24  ALT 19  ALKPHOS 89  BILITOT 0.8   Lipids No results for input(s): CHOL, TRIG, HDL, LABVLDL, LDLCALC, CHOLHDL in the last 168 hours.  Hematology Recent Labs  Lab 05/05/21 2305  WBC 11.3*  RBC 5.44*  HGB 16.2*  HCT 46.4*  MCV 85.3  MCH 29.8  MCHC 34.9  RDW 12.7  PLT 389   Thyroid No results for input(s): TSH, FREET4 in the last 168 hours.  BNPNo results for input(s): BNP, PROBNP in the last 168 hours.  DDimer No results for input(s): DDIMER in the last 168 hours.   Radiology/Studies:  DG Abdomen Acute W/Chest  Result Date: 05/06/2021 CLINICAL DATA:  Vomiting EXAM: DG ABDOMEN ACUTE WITH 1 VIEW CHEST COMPARISON:  None. FINDINGS: Cardiac shadow is mildly enlarged. Lungs are hypoinflated but clear. No bony abnormality is noted. Scattered large and small bowel  gas is noted. No free air is seen. Essure coils are noted bilaterally. Mild degenerative changes of the lumbar spine are noted. No soft tissue abnormality is seen. IMPRESSION: No acute abnormality noted. Electronically Signed   By: Inez Catalina M.D.   On: 05/06/2021 00:40     Assessment and Plan:   Terah TEONA VARGUS is a 46 y.o. female with a hx of obesity and HTN who is being seen 05/06/2021 for the evaluation of tachycardia at the request of Dr. Betsey Holiday.  #Possible Atrial Fibrillation ::The EKG by EMS appears to be Afib, but it was not seen on arrival to [facility]. She has no history of these symptoms. Perhaps she developed Afib vs SVT in the setting of dehydration from likely gastroenteritis.? This would be a reversible cause for AF. My preference is to send the patient out with a zio patch so that we can best know if she truly has Afib or SVT. This is unavailable overnight so will refer to Afib clinic where it can be considered there. -Refer to Afib clinic -Consider ordering a Zio patch to assess for arrhythmias   Risk Assessment/Risk Scores:          CHA2DS2-VASc Score = 2 {        For questions or updates, please contact [facility] Please consult [email or web address] for contact info under    Signed, Hershal Coria, MD  05/06/2021 6:26 AM

## 2021-05-06 NOTE — Discharge Instructions (Addendum)
You will need to follow up with the Cardiology office to review your fast heart rhythm that was seen today. A referral has been sent, if you don't hear from them in one or two days, call for follow-up.

## 2021-05-08 ENCOUNTER — Telehealth (INDEPENDENT_AMBULATORY_CARE_PROVIDER_SITE_OTHER): Payer: BC Managed Care – PPO | Admitting: Family Medicine

## 2021-05-08 ENCOUNTER — Encounter: Payer: Self-pay | Admitting: Family Medicine

## 2021-05-08 VITALS — Temp 97.0°F

## 2021-05-08 DIAGNOSIS — R059 Cough, unspecified: Secondary | ICD-10-CM | POA: Diagnosis not present

## 2021-05-08 DIAGNOSIS — R0981 Nasal congestion: Secondary | ICD-10-CM

## 2021-05-08 DIAGNOSIS — I10 Essential (primary) hypertension: Secondary | ICD-10-CM

## 2021-05-08 MED ORDER — BENZONATATE 100 MG PO CAPS: 100.0000 mg | ORAL_CAPSULE | Freq: Three times a day (TID) | ORAL | 0 refills | Status: DC | PRN

## 2021-05-08 NOTE — Progress Notes (Signed)
Virtual Visit via Video Note  I connected with Stacey Ruiz  on 05/08/21 at 11:40 AM EST by a video enabled telemedicine application and verified that I am speaking with the correct person using two identifiers.  Location patient: [place] Location provider:work or home office Persons participating in the virtual visit: patient, provider  I discussed the limitations and requested verbal permission for telemedicine visit. The patient expressed understanding and agreed to proceed.   HPI:  Acute telemedicine visit for : -Onset: 4 days ago -Symptoms include:cough, nasal congestion, tickle in her throat, had some vomiting initially too, headache initially, body aches -doing better but with ongoing cough and congestion -reports covid and flu tests were negative -she went to the ER a few days ago because her blood pressure was elevated 200s/100s (this was because she had run out of her blood pressure medications) -she got her blood pressure meds and now her BP is back to normal, headache is much better -per ER notes had tachyarrhythmia, spontaneously reverted with vomiting, cards was consulted and they referred her to the a. Fib clinic for follow up but pt did not get their call to schedule as was sleeping -Denies:CP, SOB, palpitations, further NV -Has tried:has tried nyquil (high BP version) -Pertinent past medical history: see below -Pertinent medication allergies:  Allergies  Allergen Reactions   Hydrocodone Itching and Nausea And Vomiting   Oxycodone Itching and Nausea And Vomiting  -COVID-19 vaccine status: has not had flu or covid vaccines - she has had covid twice Immunization History  Administered Date(s) Administered   Influenza-Unspecified 02/04/2018   Pneumococcal Polysaccharide-23 09/15/2013   Tdap 09/07/2011     ROS: See pertinent positives and negatives per HPI.  Past Medical History:  Diagnosis Date   Anxiety    Depression    Headache(784.0)    migraines   Hypertension      Past Surgical History:  Procedure Laterality Date   ABLATION     FRACTURE SURGERY  2002   left wrist   TUBAL LIGATION  11/28/2010   Procedure: ESSURE TUBAL STERILIZATION;  Surgeon: Agnes Lawrence, MD;  Location: [facility];  Service: Gynecology;  Laterality: N/A;  Attempted essure sterilization. Essure device placed in leftt side only. could not do procedure on right fallopian tube.    TUBAL LIGATION  12/05/2010   Procedure: ESSURE TUBAL STERILIZATION;  Surgeon: Agnes Lawrence, MD;  Location: [facility];  Service: Gynecology;  Laterality: Right;     Current Outpatient Medications:    amLODipine (NORVASC) 10 MG tablet, Take 1 tablet (10 mg total) by mouth daily., Disp: 90 tablet, Rfl: 1   benzonatate (TESSALON PERLES) 100 MG capsule, Take 1 capsule (100 mg total) by mouth 3 (three) times daily as needed., Disp: 30 capsule, Rfl: 0   ibuprofen (ADVIL) 600 MG tablet, TAKE 1 TABLET(600 MG) BY MOUTH EVERY 8 HOURS AS NEEDED, Disp: 90 tablet, Rfl: 1   triamterene-hydrochlorothiazide (DYAZIDE) 37.5-25 MG capsule, Take 1 each (1 capsule total) by mouth daily., Disp: 90 capsule, Rfl: 1  EXAM:  VITALS per patient if applicable:  GENERAL: alert, oriented, appears well and in no acute distress  HEENT: atraumatic, conjunttiva clear, no obvious abnormalities on inspection of external nose and ears  NECK: normal movements of the head and neck  LUNGS: on inspection no signs of respiratory distress, breathing rate appears normal, no obvious gross SOB, gasping or wheezing  CV: no obvious cyanosis  MS: moves all visible extremities without noticeable abnormality  PSYCH/NEURO: pleasant and cooperative,  no obvious depression or anxiety, speech and thought processing grossly intact  ASSESSMENT AND PLAN:  Discussed the following assessment and plan:  Cough, unspecified type  Nasal congestion  Hypertension, unspecified type  -we discussed possible serious and likely etiologies, options  for evaluation and workup, limitations of telemedicine visit vs in person visit, treatment, treatment risks and precautions. Pt is agreeable to treatment via telemedicine at this moment. For the upper resp issues - suspect viral etiology most likely and advised nasal saline rinse, OTC options for symptoms that are safe for the heart/BP and sent tessalon for cough. He BP Is back down in the normal range today with 117 SBP. She did not know that the cardiology office had tried to contact her - she now has the contact info for the A. Fib clinic and agrees to call them for follow up right after this appointment.  Advise to seek prompt virtual visit or in person care if worsening, new symptoms arise, or if is not improving with treatment as expected per our conversation of expected course. Discussed options for follow up care.    I discussed the assessment and treatment plan with the patient. The patient was provided an opportunity to ask questions and all were answered. The patient agreed with the plan and demonstrated an understanding of the instructions.     Lucretia Kern, DO

## 2021-05-08 NOTE — Patient Instructions (Signed)
°  HOME CARE TIPS:  Please contact the [facility] today to schedule follow up @ [phone]. If any issues with reaching their office please contact the folks at your primary care office so that they can assist you.   Please schedule a follow up in a few weeks with your primary care office regarding the blood pressure. Take all of you blood pressure medications as prescribed.  -I sent the medication(s) we discussed to your pharmacy: Meds ordered this encounter  Medications   benzonatate (TESSALON PERLES) 100 MG capsule    Sig: Take 1 capsule (100 mg total) by mouth 3 (three) times daily as needed.    Dispense:  30 capsule    Refill:  0    -can use tylenol  if needed for fevers, aches and pains per instructions  -can use nasal saline a few times per day if you have nasal congestion  -stay hydrated, drink plenty of fluids and eat small healthy meals - avoid dairy  -can take 1000 IU (50mcg) Vit D3 and 100-500 mg of Vit C daily per instructions  -follow up with your doctor in 2-3 days unless improving and feeling better  -stay home while sick, except to seek medical care. If you have COVID19, you will likely be contagious for 7-10 days. Flu or Influenza is likely contagious for about 7 days. Other respiratory viral infections remain contagious for 5-10+ days depending on the virus and many other factors. Wear a good mask that fits snugly (such as N95 or KN95) if around others to reduce the risk of transmission.  It was nice to meet you today, and I really hope you are feeling better soon. I help [facility] out with telemedicine visits on Tuesdays and Thursdays and am happy to help if you need a follow up virtual visit on those days. Otherwise, if you have any concerns or questions following this visit please schedule a follow up visit with your Primary Care doctor or seek care at a local urgent care clinic to avoid delays in care.    Seek in person care or schedule  a follow up video visit promptly if your symptoms worsen, new concerns arise or you are not improving with treatment. Call 911 and/or seek emergency care if your symptoms are severe or life threatening.

## 2021-05-09 ENCOUNTER — Telehealth: Payer: Self-pay | Admitting: Internal Medicine

## 2021-05-09 ENCOUNTER — Other Ambulatory Visit: Payer: Self-pay | Admitting: Internal Medicine

## 2021-05-09 NOTE — Telephone Encounter (Signed)
Pt stated that she has an OV scheduled for 2/7 to discuss

## 2021-05-09 NOTE — Telephone Encounter (Signed)
Pt is out of BP medication, has a headache.   As of 1.30.23, BP was 155/106.Took her medication that she had put in her travel bag, did not help. Wants to know if refills can be called out

## 2021-05-13 ENCOUNTER — Other Ambulatory Visit: Payer: Self-pay

## 2021-05-13 ENCOUNTER — Encounter: Payer: Self-pay | Admitting: Internal Medicine

## 2021-05-13 ENCOUNTER — Ambulatory Visit: Payer: BC Managed Care – PPO | Admitting: Internal Medicine

## 2021-05-13 VITALS — BP 120/76 | HR 83 | Temp 98.2°F | Resp 16 | Ht 65.0 in | Wt 196.0 lb

## 2021-05-13 DIAGNOSIS — Z23 Encounter for immunization: Secondary | ICD-10-CM

## 2021-05-13 DIAGNOSIS — I10 Essential (primary) hypertension: Secondary | ICD-10-CM

## 2021-05-13 DIAGNOSIS — I480 Paroxysmal atrial fibrillation: Secondary | ICD-10-CM | POA: Insufficient documentation

## 2021-05-13 DIAGNOSIS — Z1211 Encounter for screening for malignant neoplasm of colon: Secondary | ICD-10-CM | POA: Diagnosis not present

## 2021-05-13 DIAGNOSIS — Z0000 Encounter for general adult medical examination without abnormal findings: Secondary | ICD-10-CM

## 2021-05-13 DIAGNOSIS — R739 Hyperglycemia, unspecified: Secondary | ICD-10-CM

## 2021-05-13 DIAGNOSIS — J019 Acute sinusitis, unspecified: Secondary | ICD-10-CM | POA: Insufficient documentation

## 2021-05-13 DIAGNOSIS — Z0001 Encounter for general adult medical examination with abnormal findings: Secondary | ICD-10-CM

## 2021-05-13 LAB — CBC WITH DIFFERENTIAL/PLATELET
Basophils Absolute: 0 10*3/uL (ref 0.0–0.1)
Basophils Relative: 0.9 % (ref 0.0–3.0)
Eosinophils Absolute: 0.1 10*3/uL (ref 0.0–0.7)
Eosinophils Relative: 1.8 % (ref 0.0–5.0)
HCT: 41 % (ref 36.0–46.0)
Hemoglobin: 13.7 g/dL (ref 12.0–15.0)
Lymphocytes Relative: 51.6 % — ABNORMAL HIGH (ref 12.0–46.0)
Lymphs Abs: 2.8 10*3/uL (ref 0.7–4.0)
MCHC: 33.4 g/dL (ref 30.0–36.0)
MCV: 86.6 fl (ref 78.0–100.0)
Monocytes Absolute: 0.5 10*3/uL (ref 0.1–1.0)
Monocytes Relative: 9.4 % (ref 3.0–12.0)
Neutro Abs: 2 10*3/uL (ref 1.4–7.7)
Neutrophils Relative %: 36.3 % — ABNORMAL LOW (ref 43.0–77.0)
Platelets: 313 10*3/uL (ref 150.0–400.0)
RBC: 4.73 Mil/uL (ref 3.87–5.11)
RDW: 12.6 % (ref 11.5–15.5)
WBC: 5.5 10*3/uL (ref 4.0–10.5)

## 2021-05-13 LAB — LIPID PANEL
Cholesterol: 155 mg/dL (ref 0–200)
HDL: 37.3 mg/dL — ABNORMAL LOW (ref >39.00)
LDL Cholesterol: 83 mg/dL (ref 0–99)
NonHDL: 117.48
Total CHOL/HDL Ratio: 4
Triglycerides: 172 mg/dL — ABNORMAL HIGH (ref 0.0–149.0)
VLDL: 34.4 mg/dL (ref 0.0–40.0)

## 2021-05-13 LAB — BASIC METABOLIC PANEL
BUN: 12 mg/dL (ref 6–23)
CO2: 38 mEq/L — ABNORMAL HIGH (ref 19–32)
Calcium: 10.1 mg/dL (ref 8.4–10.5)
Chloride: 96 mEq/L (ref 96–112)
Creatinine, Ser: 0.87 mg/dL (ref 0.40–1.20)
GFR: 79.58 mL/min (ref >60.00)
Glucose, Bld: 123 mg/dL — ABNORMAL HIGH (ref 70–99)
Potassium: 3.7 mEq/L (ref 3.5–5.1)
Sodium: 139 mEq/L (ref 135–145)

## 2021-05-13 LAB — TSH: TSH: 0.61 u[IU]/mL (ref 0.35–5.50)

## 2021-05-13 MED ORDER — AMOXICILLIN-POT CLAVULANATE 875-125 MG PO TABS: 1.0000 | ORAL_TABLET | Freq: Two times a day (BID) | ORAL | 0 refills | Status: AC

## 2021-05-13 MED ORDER — FLUCONAZOLE 150 MG PO TABS: 150.0000 mg | ORAL_TABLET | Freq: Once | ORAL | 3 refills | Status: AC

## 2021-05-13 NOTE — Patient Instructions (Signed)

## 2021-05-13 NOTE — Progress Notes (Signed)
Subjective:  Patient ID: Stacey Ruiz, female    DOB: 13-Jun-1975  Age: 46 y.o. MRN: [id number]  CC: Annual Exam, Hypertension, Atrial Fibrillation, and Sinusitis  This visit occurred during the SARS-CoV-2 public health emergency.  Safety protocols were in place, including screening questions prior to the visit, additional usage of staff PPE, and extensive cleaning of exam room while observing appropriate contact time as indicated for disinfecting solutions.    HPI Stacey Ruiz presents for a CPX and f/up -  She was recently seen in the ED for elevated blood pressure and palpitations.  She tells me she has been taking triamterene, hydrochlorothiazide, amlodipine.  She tells me her blood pressure has been well controlled.  She denies headache, blurred vision, palpitations, or edema.  Outpatient Medications Prior to Visit  Medication Sig Dispense Refill   amLODipine (NORVASC) 10 MG tablet Take 1 tablet (10 mg total) by mouth daily. 90 tablet 1   benzonatate (TESSALON PERLES) 100 MG capsule Take 1 capsule (100 mg total) by mouth 3 (three) times daily as needed. 30 capsule 0   diazepam (VALIUM) 5 MG tablet Take 5 mg by mouth as needed.     ibuprofen (ADVIL) 600 MG tablet TAKE 1 TABLET(600 MG) BY MOUTH EVERY 8 HOURS AS NEEDED 90 tablet 1   triamterene-hydrochlorothiazide (DYAZIDE) 37.5-25 MG capsule Take 1 each (1 capsule total) by mouth daily. 90 capsule 1   meloxicam (MOBIC) 15 MG tablet Take 15 mg by mouth daily.     No facility-administered medications prior to visit.    ROS Review of Systems  Constitutional:  Negative for appetite change, diaphoresis, fatigue and fever.  HENT:  Positive for rhinorrhea (yellow/green phlegm) and sinus pressure. Negative for facial swelling, nosebleeds, postnasal drip, sore throat and trouble swallowing.        Sinus sx's for 10 days  Eyes:  Negative for visual disturbance.  Respiratory:  Negative for cough, chest tightness, shortness of breath  and wheezing.   Cardiovascular: Negative.  Negative for chest pain, palpitations and leg swelling.  Gastrointestinal:  Negative for abdominal pain, constipation, diarrhea, nausea and vomiting.  Genitourinary: Negative.   Musculoskeletal:  Negative for arthralgias and myalgias.  Skin: Negative.   Neurological:  Negative for dizziness, weakness, light-headedness, numbness and headaches.  Hematological:  Negative for adenopathy. Does not bruise/bleed easily.  Psychiatric/Behavioral: Negative.     Objective:  BP 120/76 (BP Location: Left Arm, Patient Position: Sitting, Cuff Size: Large)    Pulse 83    Temp 98.2 F (36.8 C) (Oral)    Resp 16    Ht 5\' 5"  (1.651 m)    Wt 196 lb (88.9 kg)    SpO2 98%    BMI 32.62 kg/m   BP Readings from Last 3 Encounters:  05/14/21 94/64  05/13/21 120/76  05/06/21 (!) 146/91    Wt Readings from Last 3 Encounters:  05/14/21 200 lb 9.6 oz (91 kg)  05/13/21 196 lb (88.9 kg)  06/24/20 191 lb (86.6 kg)    Physical Exam Vitals reviewed.  Constitutional:      Appearance: Normal appearance.  HENT:     Nose: Mucosal edema and rhinorrhea present. Rhinorrhea is purulent.     Mouth/Throat:     Mouth: Mucous membranes are moist.     Pharynx: No oropharyngeal exudate or posterior oropharyngeal erythema.  Eyes:     General: No scleral icterus.    Conjunctiva/sclera: Conjunctivae normal.  Cardiovascular:     Rate and Rhythm: Normal  rate and regular rhythm.     Heart sounds: No murmur heard. Pulmonary:     Effort: Pulmonary effort is normal.     Breath sounds: No stridor. No wheezing, rhonchi or rales.  Abdominal:     General: Abdomen is flat.     Palpations: There is no mass.     Tenderness: There is no abdominal tenderness. There is no guarding.     Hernia: No hernia is present.  Musculoskeletal:        General: Normal range of motion.     Cervical back: Neck supple. No tenderness.     Right lower leg: No edema.     Left lower leg: No edema.   Lymphadenopathy:     Cervical: No cervical adenopathy.  Skin:    General: Skin is warm and dry.     Findings: No rash.  Neurological:     General: No focal deficit present.     Mental Status: She is alert. Mental status is at baseline.  Psychiatric:        Mood and Affect: Mood normal.        Behavior: Behavior normal.    Lab Results  Component Value Date   WBC 5.5 05/13/2021   HGB 13.7 05/13/2021   HCT 41.0 05/13/2021   PLT 313.0 05/13/2021   GLUCOSE 123 (H) 05/13/2021   CHOL 155 05/13/2021   TRIG 172.0 (H) 05/13/2021   HDL 37.30 (L) 05/13/2021   LDLCALC 83 05/13/2021   ALT 19 05/05/2021   AST 24 05/05/2021   NA 139 05/13/2021   K 3.7 05/13/2021   CL 96 05/13/2021   CREATININE 0.87 05/13/2021   BUN 12 05/13/2021   CO2 38 (H) 05/13/2021   TSH 0.61 05/13/2021   INR 0.9 05/20/2011   HGBA1C 5.2 05/15/2016    DG Abdomen Acute W/Chest  Result Date: 05/06/2021 CLINICAL DATA:  Vomiting EXAM: DG ABDOMEN ACUTE WITH 1 VIEW CHEST COMPARISON:  None. FINDINGS: Cardiac shadow is mildly enlarged. Lungs are hypoinflated but clear. No bony abnormality is noted. Scattered large and small bowel gas is noted. No free air is seen. Essure coils are noted bilaterally. Mild degenerative changes of the lumbar spine are noted. No soft tissue abnormality is seen. IMPRESSION: No acute abnormality noted. Electronically Signed   By: Inez Catalina M.D.   On: 05/06/2021 00:40    Assessment & Plan:   Kamaree was seen today for annual exam, hypertension, atrial fibrillation and sinusitis.  Diagnoses and all orders for this visit:  PAF (paroxysmal atrial fibrillation) ([place])- She is following up with cardiology. -     TSH; Future -     TSH  Acute sinusitis, recurrence not specified, unspecified location -     amoxicillin-clavulanate (AUGMENTIN) 875-125 MG tablet; Take 1 tablet by mouth 2 (two) times daily for 10 days. -     fluconazole (DIFLUCAN) 150 MG tablet; Take 1 tablet (150 mg total) by mouth  once for 1 dose.  Screen for colon cancer -     Ambulatory referral to Gastroenterology  Encounter for general adult medical examination with abnormal findings- Exam completed, labs reviewed, vaccines reviewed; she is not willing to get vaccinated against COVID or influenza; cancer screenings addressed, patient education was given. -     Lipid panel; Future -     Lipid panel  Essential hypertension, benign- Her blood pressure is adequately well controlled. -     Basic metabolic panel; Future -     TSH;  Future -     CBC with Differential/Platelet; Future -     CBC with Differential/Platelet -     TSH -     Basic metabolic panel  Need for Tdap vaccination -     Tdap vaccine greater than or equal to 7yo IM  Chronic hyperglycemia -     Hemoglobin A1c; Future   I am having Kelaiah H. Pidcock start on amoxicillin-clavulanate and fluconazole. I am also having her maintain her ibuprofen, amLODipine, triamterene-hydrochlorothiazide, benzonatate, and diazepam.  Meds ordered this encounter  Medications   amoxicillin-clavulanate (AUGMENTIN) 875-125 MG tablet    Sig: Take 1 tablet by mouth 2 (two) times daily for 10 days.    Dispense:  20 tablet    Refill:  0   fluconazole (DIFLUCAN) 150 MG tablet    Sig: Take 1 tablet (150 mg total) by mouth once for 1 dose.    Dispense:  1 tablet    Refill:  3     Follow-up: Return in about 6 months (around 11/10/2021).  Scarlette Calico, MD

## 2021-05-14 ENCOUNTER — Encounter (HOSPITAL_COMMUNITY): Payer: Self-pay | Admitting: Nurse Practitioner

## 2021-05-14 ENCOUNTER — Ambulatory Visit (HOSPITAL_COMMUNITY)
Admission: RE | Admit: 2021-05-14 | Discharge: 2021-05-14 | Disposition: A | Discharge status: Home / Self Care | Payer: BC Managed Care – PPO | Source: Ambulatory Visit | Attending: Nurse Practitioner | Admitting: Nurse Practitioner

## 2021-05-14 VITALS — BP 94/64 | HR 73 | Ht 65.0 in | Wt 200.6 lb

## 2021-05-14 DIAGNOSIS — F1090 Alcohol use, unspecified, uncomplicated: Secondary | ICD-10-CM | POA: Insufficient documentation

## 2021-05-14 DIAGNOSIS — R9431 Abnormal electrocardiogram [ECG] [EKG]: Secondary | ICD-10-CM | POA: Diagnosis not present

## 2021-05-14 DIAGNOSIS — Z713 Dietary counseling and surveillance: Secondary | ICD-10-CM | POA: Diagnosis not present

## 2021-05-14 DIAGNOSIS — I10 Essential (primary) hypertension: Secondary | ICD-10-CM | POA: Insufficient documentation

## 2021-05-14 DIAGNOSIS — Z7182 Exercise counseling: Secondary | ICD-10-CM | POA: Insufficient documentation

## 2021-05-14 DIAGNOSIS — I480 Paroxysmal atrial fibrillation: Secondary | ICD-10-CM

## 2021-05-14 DIAGNOSIS — R0683 Snoring: Secondary | ICD-10-CM | POA: Diagnosis not present

## 2021-05-14 DIAGNOSIS — Z6833 Body mass index (BMI) 33.0-33.9, adult: Secondary | ICD-10-CM | POA: Insufficient documentation

## 2021-05-14 DIAGNOSIS — Z79899 Other long term (current) drug therapy: Secondary | ICD-10-CM | POA: Diagnosis not present

## 2021-05-14 DIAGNOSIS — E669 Obesity, unspecified: Secondary | ICD-10-CM | POA: Insufficient documentation

## 2021-05-14 NOTE — Progress Notes (Signed)
Primary Care Physician: Janith Lima, MD Referring Physician: [facility] ED    Stacey Ruiz is a 46 y.o. female with a h/o HTN that presented to ER with N/V elevated BP. Afib was caught briefly by EMS  with v rates 150 -200 bpm, but pt vomited and converted. No further afib noted. She had run out of her BP meds x one week, needed an appointment to refill which was pending. Her BP on arrival was 153/115. No h/o of afib. It was decided that in the setting of dehydration/gastroenteritis that this was situational and did not need anticoagulation with a CHA2DS2VASc  of 2. She was referred to afib clinic.  Today, back on meds her BP is well controlled. EKG shows SR. No further N/V. She has reduced alcohol intake, some snoring but not significant apnea. She has not felt any more palpitations. She has 3 children and also raising a grandchild. 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 She tries to walk for exercise.   Today, she denies symptoms of palpitations, chest pain, shortness of breath, orthopnea, PND, lower extremity edema, dizziness, presyncope, syncope, or neurologic sequela. The patient is tolerating medications without difficulties and is otherwise without complaint today.   Past Medical History:  Diagnosis Date   Anxiety    Depression    Headache(784.0)    migraines   Hypertension    Past Surgical History:  Procedure Laterality Date   ABLATION     FRACTURE SURGERY  2002   left wrist   TUBAL LIGATION  11/28/2010   Procedure: ESSURE TUBAL STERILIZATION;  Surgeon: Agnes Lawrence, MD;  Location: [facility];  Service: Gynecology;  Laterality: N/A;  Attempted essure sterilization. Essure device placed in leftt side only. could not do procedure on right fallopian tube.    TUBAL LIGATION  12/05/2010   Procedure: ESSURE TUBAL STERILIZATION;   Surgeon: Agnes Lawrence, MD;  Location: [facility];  Service: Gynecology;  Laterality: Right;    Current Outpatient Medications  Medication Sig Dispense Refill   amLODipine (NORVASC) 10 MG tablet Take 1 tablet (10 mg total) by mouth daily. 90 tablet 1   amoxicillin-clavulanate (AUGMENTIN) 875-125 MG tablet Take 1 tablet by mouth 2 (two) times daily for 10 days. 20 tablet 0   benzonatate (TESSALON PERLES) 100 MG capsule Take 1 capsule (100 mg total) by mouth 3 (three) times daily as needed. 30 capsule 0   diazepam (VALIUM) 5 MG tablet Take 5 mg by mouth as needed.     ibuprofen (ADVIL) 600 MG tablet TAKE 1 TABLET(600 MG) BY MOUTH EVERY 8 HOURS AS NEEDED 90 tablet 1   triamterene-hydrochlorothiazide (DYAZIDE) 37.5-25 MG capsule Take 1 each (1 capsule total) by mouth daily. 90 capsule 1   fluconazole (DIFLUCAN) 150 MG tablet Take 150 mg by mouth once. (Patient not taking: Reported on 05/14/2021)     No current facility-administered medications for this encounter.    Allergies  Allergen Reactions   Hydrocodone Itching and Nausea And Vomiting   Oxycodone Itching and Nausea And Vomiting    Social History   Socioeconomic History   Marital status: Widowed    Spouse name: Not on file   Number of children: Not on file   Years of education: Not on file   Highest education level: Not on file  Occupational History   Not on file  Tobacco Use   Smoking status: Former    Types: Cigarettes    Quit date: 11/12/2007    Years since quitting: 13.5   Smokeless tobacco: Never  Substance and Sexual Activity   Alcohol use: Yes    Alcohol/week: 7.0 standard drinks    Types: 7 Glasses of wine per week    Comment: occ   Drug use: Yes    Types: Marijuana    Comment: occ   Sexual activity: Never    Birth control/protection: Surgical  Other Topics Concern   Not on file  Social History Narrative   Not on file   Social Determinants of Health   Financial Resource Strain: Not on file  Food  Insecurity: Not on file  Transportation Needs: Not on file  Physical Activity: Not on file  Stress: Not on file  Social Connections: Not on file  Intimate Partner Violence: Not on file    Family History  Problem Relation Age of Onset   Cancer Mother    Breast cancer Maternal Aunt 64   Diabetes Neg Hx    Early death Neg Hx    Heart disease Neg Hx    Hyperlipidemia Neg Hx    Hypertension Neg Hx    Kidney disease Neg Hx    Stroke Neg Hx     ROS- All systems are reviewed and negative except as per the HPI above  Physical Exam: Vitals:   05/14/21 1028  BP: 94/64  Pulse: 73  Weight: 91 kg  Height: 5\' 5"  (1.651 m)   Wt Readings from Last 3 Encounters:  05/14/21 91 kg  05/13/21 88.9 kg  06/24/20 86.6 kg    Labs: Lab Results  Component Value Date   NA 139 05/13/2021   K 3.7 05/13/2021   CL 96 05/13/2021   CO2 38 (H) 05/13/2021   GLUCOSE 123 (H) 05/13/2021   BUN 12 05/13/2021   CREATININE 0.87 05/13/2021   CALCIUM 10.1 05/13/2021   MG 1.9 05/05/2021   Lab Results  Component Value Date   INR 0.9 05/20/2011   Lab Results  Component Value Date   CHOL 155 05/13/2021   HDL 37.30 (L) 05/13/2021   LDLCALC 83 05/13/2021   TRIG 172.0 (H) 05/13/2021     GEN- The patient is well appearing, alert and oriented x 3 today.   Head- normocephalic, atraumatic Eyes-  Sclera clear, conjunctiva pink Ears- hearing intact Oropharynx- clear Neck- supple, no JVP Lymph- no cervical lymphadenopathy Lungs- Clear to ausculation bilaterally, normal work of breathing Heart- Regular rate and rhythm, no murmurs, rubs or gallops, PMI not laterally displaced GI- soft, NT, ND, + BS Extremities- no clubbing, cyanosis, or edema MS- no significant deformity or atrophy Skin- no rash or lesion Psych- euthymic mood, full affect Neuro- strength and sensation are intact  EKG-NSR at 73 bpm, pr int 148 ms, qrs int 82 ms, qtc 429 ms  ER records reviewed    Assessment and Plan:  1. Afib   Very short lived and episodic to acute nausea and vomiting with ER visit  Now BP meds on board and BP stable No further palpitations noted  General education re afib and triggers discussed  Will place a 1 week Zio patch and scheduled echo but no change in approach today  2. CHA2DS2VASc  score of 2 No anticoagulation by guidelines needed today   3. BMI of 33.38 Regular exercise and diet modification discussed   I  will call report of above test when available  F/u will be decided on results of test    Butch Penny C. Rilee Knoll, [facility] [address] [place], [place] [address] [phone]

## 2021-05-25 ENCOUNTER — Other Ambulatory Visit: Payer: Self-pay

## 2021-05-25 ENCOUNTER — Emergency Department (HOSPITAL_COMMUNITY)
Admission: EM | Admit: 2021-05-25 | Discharge: 2021-05-25 | Disposition: A | Discharge status: Home / Self Care | Payer: BC Managed Care – PPO | Attending: Emergency Medicine | Admitting: Emergency Medicine

## 2021-05-25 ENCOUNTER — Encounter (HOSPITAL_COMMUNITY): Payer: Self-pay | Admitting: Emergency Medicine

## 2021-05-25 DIAGNOSIS — Y92410 Unspecified street and highway as the place of occurrence of the external cause: Secondary | ICD-10-CM | POA: Insufficient documentation

## 2021-05-25 DIAGNOSIS — M25512 Pain in left shoulder: Secondary | ICD-10-CM | POA: Diagnosis not present

## 2021-05-25 DIAGNOSIS — Z5321 Procedure and treatment not carried out due to patient leaving prior to being seen by health care provider: Secondary | ICD-10-CM | POA: Insufficient documentation

## 2021-05-25 DIAGNOSIS — M545 Low back pain, unspecified: Secondary | ICD-10-CM | POA: Diagnosis not present
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
## 2021-05-28 DIAGNOSIS — I480 Paroxysmal atrial fibrillation: Secondary | ICD-10-CM | POA: Diagnosis not present

## 2021-05-30 ENCOUNTER — Encounter (HOSPITAL_COMMUNITY): Payer: Self-pay | Admitting: *Deleted

## 2021-06-09 ENCOUNTER — Other Ambulatory Visit: Payer: Self-pay

## 2021-06-09 ENCOUNTER — Ambulatory Visit (HOSPITAL_COMMUNITY)
Admission: RE | Admit: 2021-06-09 | Discharge: 2021-06-09 | Disposition: A | Discharge status: Home / Self Care | Payer: BC Managed Care – PPO | Source: Ambulatory Visit | Attending: Nurse Practitioner | Admitting: Nurse Practitioner

## 2021-06-09 DIAGNOSIS — I071 Rheumatic tricuspid insufficiency: Secondary | ICD-10-CM | POA: Diagnosis not present

## 2021-06-09 DIAGNOSIS — I480 Paroxysmal atrial fibrillation: Secondary | ICD-10-CM | POA: Diagnosis not present

## 2021-06-09 DIAGNOSIS — I119 Hypertensive heart disease without heart failure: Secondary | ICD-10-CM | POA: Diagnosis not present

## 2021-06-09 DIAGNOSIS — R9431 Abnormal electrocardiogram [ECG] [EKG]: Secondary | ICD-10-CM | POA: Insufficient documentation

## 2021-06-09 LAB — ECHOCARDIOGRAM COMPLETE
Area-P 1/2: 3.39 cm2
Calc EF: 64.6 %
S' Lateral: 2.9 cm
Single Plane A2C EF: 68.5 %
Single Plane A4C EF: 65.6 %

## 2021-06-09 NOTE — Progress Notes (Signed)
?  Echocardiogram ?2D Echocardiogram has been performed. ? ?Stacey Ruiz ?06/09/2021, 10:40 AM ?

## 2021-06-13 ENCOUNTER — Other Ambulatory Visit (HOSPITAL_COMMUNITY): Payer: Self-pay | Admitting: *Deleted

## 2021-06-13 DIAGNOSIS — I071 Rheumatic tricuspid insufficiency: Secondary | ICD-10-CM

## 2021-06-16 ENCOUNTER — Other Ambulatory Visit (HOSPITAL_COMMUNITY): Payer: BC Managed Care – PPO

## 2021-06-19 NOTE — Progress Notes (Incomplete)
CARDIOLOGY CONSULT NOTE  ? ? ? ? ? ?Patient ID: ?Stacey Ruiz ?MRN: [id number] ?DOB/AGE: Aug 29, 1975 47 y.o. ? 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 ?Active Problems: ?  * No active hospital problems. * ? ? ?HPI:  46 y.o. referred by Dr Ronnald Ramp for TV disease. She has a history of HTN with non compliance and running out of medicine Seen in ED 05/06/21 with elevated BP likely gastroenteritis dehydration. EMS indicate possible PAF but only SR/ST noted in ER. KUB negative TSH normal WBC 11.3 some hemoconcentration with Hct 46.4 now normal F/U monitor 05/28/21 with NSR. No SVT/PAF PAC/PVC < 1% total beats  ? ?TTE 06/09/21 EF 62% mild LVH normal RV size and function normal estimated PA pressure trivial MR TR noted as moderate To my review TR only looked mild and there were no morphologic signs of pulmonary HTN ? ?She has 3 children and also raising a grandchild. 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 She tries to walk for exercise.  ? ?She is on norvasc 10 mg and Dyazide 37.5/25 mg  ? ?*** ? ? ? ? ? ? ?ROS ?All other systems reviewed and negative except as noted above ? ?Past Medical History:  ?Diagnosis Date  ?? Anxiety   ?? Depression   ?? Headache(784.0)   ? migraines  ?? Hypertension   ?  ?Family History  ?Problem Relation Age of Onset  ?? Cancer Mother   ?? Breast cancer Maternal Aunt 55  ?? Diabetes Neg Hx   ?? Early death Neg Hx   ?? Heart disease Neg Hx   ?? Hyperlipidemia Neg Hx   ?? Hypertension Neg Hx   ?? Kidney disease Neg Hx   ?? Stroke Neg Hx   ?  ?Social History  ? ?Socioeconomic History  ?? Marital status: Widowed  ?  Spouse name: Not on file  ?? Number of children: Not on file  ?? Years of education: Not on file  ?? Highest education level:  Not on file  ?Occupational History  ?? Not on file  ?Tobacco Use  ?? Smoking status: Former  ?  Types: Cigarettes  ?  Quit date: 11/12/2007  ?  Years since quitting: 13.6  ?? Smokeless tobacco: Never  ?Substance and Sexual Activity  ?? Alcohol use: Yes  ?  Alcohol/week: 7.0 standard drinks  ?  Types: 7 Glasses of wine per week  ?  Comment: occ  ?? Drug use: Yes  ?  Types: Marijuana  ?  Comment: occ  ?? Sexual activity: Never  ?  Birth control/protection: Surgical  ?Other Topics Concern  ?? Not on file  ?Social History Narrative  ?? Not on file  ? ?Social Determinants of Health  ? ?Financial Resource Strain: Not on file  ?Food Insecurity: Not on file  ?Transportation Needs: Not on file  ?Physical Activity: Not on file  ?Stress: Not on file  ?Social Connections: Not on file  ?Intimate Partner Violence: Not on file  ?  ?Past Surgical History:  ?Procedure Laterality Date  ?? ABLATION    ?? FRACTURE SURGERY  2002  ? left wrist  ?? TUBAL LIGATION  11/28/2010  ? Procedure: ESSURE TUBAL STERILIZATION;  Surgeon: Agnes Lawrence, MD;  Location: [facility];  Service: Gynecology;  Laterality: N/A;  Attempted essure sterilization. Essure device placed in leftt side only. could not do procedure on right fallopian tube.   ?? TUBAL LIGATION  12/05/2010  ? Procedure: ESSURE TUBAL STERILIZATION;  Surgeon: Agnes Lawrence, MD;  Location: [facility];  Service: Gynecology;  Laterality: Right;  ?  ? ? ?Current Outpatient Medications:  ??  amLODipine (NORVASC) 10 MG tablet, Take 1 tablet (10 mg total) by mouth daily., Disp: 90 tablet, Rfl: 1 ??  benzonatate (TESSALON PERLES) 100 MG capsule, Take 1 capsule (100 mg total) by mouth 3 (three) times daily as needed., Disp: 30 capsule, Rfl: 0 ??  diazepam (VALIUM) 5 MG tablet, Take 5 mg by mouth as needed., Disp: , Rfl:  ??  fluconazole (DIFLUCAN) 150 MG tablet, Take 150 mg by mouth once. (Patient not taking: Reported on 05/14/2021), Disp: , Rfl:  ??  ibuprofen (ADVIL) 600 MG tablet, TAKE 1  TABLET(600 MG) BY MOUTH EVERY 8 HOURS AS NEEDED, Disp: 90 tablet, Rfl: 1 ??  triamterene-hydrochlorothiazide (DYAZIDE) 37.5-25 MG capsule, Take 1 each (1 capsule total) by mouth daily., Disp: 90 capsule, Rfl: 1 ? ? ? ?Physical Exam: ?There were no vitals taken for this visit.  ?  ?Affect appropriate ?Healthy:  appears stated age ?HEENT: normal ?Neck supple with no adenopathy ?JVP normal no bruits no thyromegaly ?Lungs clear with no wheezing and good diaphragmatic motion ?Heart:  S1/S2 no murmur, no rub, gallop or click ?PMI normal ?Abdomen: benighn, BS positve, no tenderness, no AAA ?no bruit.  No HSM or HJR ?Distal pulses intact with no bruits ?No edema ?Neuro non-focal ?Skin warm and dry ?No muscular weakness ? ? ?Labs: ?  ?Lab Results  ?Component Value Date  ? WBC 5.5 05/13/2021  ? HGB 13.7 05/13/2021  ? HCT 41.0 05/13/2021  ? MCV 86.6 05/13/2021  ? PLT 313.0 05/13/2021  ? No results for input(s): NA, K, CL, CO2, BUN, CREATININE, CALCIUM, PROT, BILITOT, ALKPHOS, ALT, AST, GLUCOSE in the last 168 hours. ? ?Invalid input(s): LABALBU ?No results found for: CKTOTAL, CKMB, CKMBINDEX, TROPONINI  ?Lab Results  ?Component Value Date  ? CHOL 155 05/13/2021  ? CHOL 165 01/15/2020  ? CHOL 146 07/26/2017  ? ?Lab Results  ?Component Value Date  ? HDL 37.30 (L) 05/13/2021  ? HDL 43.50 01/15/2020  ? HDL 54.90 07/26/2017  ? ?Lab Results  ?Component Value Date  ? [place] 83 05/13/2021  ? [place] 92 01/15/2020  ? [place] 75 07/26/2017  ? ?Lab Results  ?Component Value Date  ? TRIG 172.0 (H) 05/13/2021  ? TRIG 149.0 01/15/2020  ? TRIG 78.0 07/26/2017  ? ?Lab Results  ?Component Value Date  ? CHOLHDL 4 05/13/2021  ? CHOLHDL 4 01/15/2020  ? CHOLHDL 3 07/26/2017  ? ?No results found for: LDLDIRECT  ?  ?Radiology: ?ECHOCARDIOGRAM COMPLETE ? ?Result Date: 06/09/2021 ?   ECHOCARDIOGRAM REPORT   Patient Name:   Stacey Ruiz Date of Exam: 06/09/2021 Medical Rec #:  [id number]         Height:       65.0 in Accession #:    [id number]         Weight:       200.6 lb Date of Birth:  Aug 17, 1975          BSA:          1.981 m? Patient Age:    65 years          BP:  116/75 mmHg Patient Gender: F                 HR:           46 bpm. Exam Location:  Outpatient Procedure: 2D Echo, 3D Echo, Cardiac Doppler, Color Doppler and Strain Analysis Indications:    I48.91* Unspeicified atrial fibrillation  History:        Patient has no prior history of Echocardiogram examinations.                 Abnormal ECG, Arrythmias:Atrial Fibrillation; Risk                 Factors:Hypertension.  Sonographer:    Roseanna Rainbow RDCS Referring Phys: [phone] DONNA C CARROLL  Sonographer Comments: Global longitudinal strain was attempted. IMPRESSIONS  1. Left ventricular ejection fraction by 3D volume is 62 %. The left ventricle has normal function. The left ventricle has no regional wall motion abnormalities. There is mild concentric left ventricular hypertrophy. Left ventricular diastolic parameters are indeterminate.  2. Right ventricular systolic function is normal. The right ventricular size is normal. There is normal pulmonary artery systolic pressure.  3. The mitral valve is normal in structure. Trivial mitral valve regurgitation. No evidence of mitral stenosis.  4. Tricuspid valve regurgitation is moderate.  5. The aortic valve is normal in structure. Aortic valve regurgitation is not visualized. No aortic stenosis is present.  6. The inferior vena cava is normal in size with greater than 50% respiratory variability, suggesting right atrial pressure of 3 mmHg. Comparison(s): No prior Echocardiogram. FINDINGS  Left Ventricle: Left ventricular ejection fraction by 3D volume is 62 %. The left ventricle has normal function. The left ventricle has no regional wall motion abnormalities. The left ventricular internal cavity size was normal in size. There is mild concentric left ventricular hypertrophy. Left ventricular diastolic parameters are indeterminate. Right Ventricle: The  right ventricular size is normal. No increase in right ventricular wall thickness. Right ventricular systolic function is normal. There is normal pulmonary artery systolic pressure. The tricuspid regurgitant veloc

## 2021-06-27 ENCOUNTER — Ambulatory Visit: Payer: BC Managed Care – PPO | Admitting: Cardiovascular Disease

## 2021-07-08 ENCOUNTER — Other Ambulatory Visit: Payer: Self-pay | Admitting: Chiropractic Medicine

## 2021-07-08 DIAGNOSIS — M25511 Pain in right shoulder: Secondary | ICD-10-CM

## 2021-07-15 ENCOUNTER — Ambulatory Visit: Payer: BC Managed Care – PPO | Admitting: Internal Medicine

## 2021-07-15 NOTE — Progress Notes (Deleted)
?Cardiology Office Note:   ? ?Date:  07/15/2021  ? ?ID:  Stacey Ruiz, DOB March 21, 1976, MRN [id number] ? ?PCP:  Janith Lima, MD ?  ?[facility] Providers ?Cardiologist:  None { ?Click to update primary MD,subspecialty MD or APP then REFRESH:1}   ? ?Referring MD: Janith Lima, MD  ? ?No chief complaint on file. ?*** ? ?History of Present Illness:   ? ?Stacey Ruiz is a 46 y.o. female with a hx of *** ? ? ?Patient was seen in January with ?AF. Not captured at [facility]. Underwent a ziopatch that did not show afib. Shows sinus rhythm with less than 1% ventricular and SVT ectopy.  She underwent an echo that showed normal LV function. Her PASP was normal. TR velocity of 2.3 m/s. Consult for TR.  She denies hx of sleep apnea ? ?Past Medical History:  ?Diagnosis Date  ? Anxiety   ? Depression   ? Headache(784.0)   ? migraines  ? Hypertension   ? ? ?Past Surgical History:  ?Procedure Laterality Date  ? ABLATION    ? FRACTURE SURGERY  2002  ? left wrist  ? TUBAL LIGATION  11/28/2010  ? Procedure: ESSURE TUBAL STERILIZATION;  Surgeon: Agnes Lawrence, MD;  Location: [facility];  Service: Gynecology;  Laterality: N/A;  Attempted essure sterilization. Essure device placed in leftt side only. could not do procedure on right fallopian tube.   ? TUBAL LIGATION  12/05/2010  ? Procedure: ESSURE TUBAL STERILIZATION;  Surgeon: Agnes Lawrence, MD;  Location: [facility];  Service: Gynecology;  Laterality: Right;  ? ? ?Current Medications: ?No outpatient medications have been marked as taking for the 07/15/21 encounter (Appointment) with Janina Mayo, MD.  ?  ? ?Allergies:   Hydrocodone and Oxycodone  ? ?Social History  ? ?Socioeconomic History  ? Marital status: Widowed  ?  Spouse name: Not on file  ? Number of children: Not on file  ? Years of education: Not on file  ? Highest education level: Not on file  ?Occupational History  ? Not on file  ?Tobacco Use  ? Smoking status: Former  ?  Types: Cigarettes  ?  Quit date:  11/12/2007  ?  Years since quitting: 13.6  ? Smokeless tobacco: Never  ?Substance and Sexual Activity  ? Alcohol use: Yes  ?  Alcohol/week: 7.0 standard drinks  ?  Types: 7 Glasses of wine per week  ?  Comment: occ  ? Drug use: Yes  ?  Types: Marijuana  ?  Comment: occ  ? Sexual activity: Never  ?  Birth control/protection: Surgical  ?Other Topics Concern  ? Not on file  ?Social History Narrative  ? Not on file  ? ?Social Determinants of Health  ? ?Financial Resource Strain: Not on file  ?Food Insecurity: Not on file  ?Transportation Needs: Not on file  ?Physical Activity: Not on file  ?Stress: Not on file  ?Social Connections: Not on file  ?  ? ?Family History: ?The patient's ***family history includes Breast cancer (age of onset: 18) in her maternal aunt; Cancer in her mother. There is no history of Diabetes, Early death, Heart disease, Hyperlipidemia, Hypertension, Kidney disease, or Stroke. ? ?ROS:   ?Please see the history of present illness.    ?*** All other systems reviewed and are negative. ? ?EKGs/Labs/Other Studies Reviewed:   ? ?The following studies were reviewed today: ?*** ? ?EKG:  EKG is *** ordered today.  The ekg ordered today demonstrates *** ? ?  Recent Labs: ?05/05/2021: ALT 19; Magnesium 1.9 ?05/13/2021: BUN 12; Creatinine, Ser 0.87; Hemoglobin 13.7; Platelets 313.0; Potassium 3.7; Sodium 139; TSH 0.61  ?Recent Lipid Panel ?   ?Component Value Date/Time  ? CHOL 155 05/13/2021 1132  ? TRIG 172.0 (H) 05/13/2021 1132  ? HDL 37.30 (L) 05/13/2021 1132  ? CHOLHDL 4 05/13/2021 1132  ? VLDL 34.4 05/13/2021 1132  ? [place] 83 05/13/2021 1132  ? ? ? ?Risk Assessment/Calculations:   ?{Does this patient have ATRIAL FIBRILLATION?:[phone]} ? ?    ? ?Physical Exam:   ? ?VS:  There were no vitals taken for this visit.   ? ?Wt Readings from Last 3 Encounters:  ?05/14/21 200 lb 9.6 oz (91 kg)  ?05/13/21 196 lb (88.9 kg)  ?06/24/20 191 lb (86.6 kg)  ?  ? ?GEN: *** Well nourished, well developed in no acute  distress ?HEENT: Normal ?NECK: No JVD; No carotid bruits ?LYMPHATICS: No lymphadenopathy ?CARDIAC: ***RRR, no murmurs, rubs, gallops ?RESPIRATORY:  Clear to auscultation without rales, wheezing or rhonchi  ?ABDOMEN: Soft, non-tender, non-distended ?MUSCULOSKELETAL:  No edema; No deformity  ?SKIN: Warm and dry ?NEUROLOGIC:  Alert and oriented x 3 ?PSYCHIATRIC:  Normal affect  ? ?ASSESSMENT:   ? ?No diagnosis found. ?PLAN:   ? ?In order of problems listed above: ? ?*** ? ?   ? ?{Are you ordering a CV Procedure (e.g. stress test, cath, DCCV, TEE, etc)?   Press F2        :[id number]}  ? ? ?Medication Adjustments/Labs and Tests Ordered: ?Current medicines are reviewed at length with the patient today.  Concerns regarding medicines are outlined above.  ?No orders of the defined types were placed in this encounter. ? ?No orders of the defined types were placed in this encounter. ? ? ?There are no Patient Instructions on file for this visit.  ? ?Signed, ?Janina Mayo, MD  ?07/15/2021 8:14 AM    ?[place] ?

## 2021-07-15 NOTE — Progress Notes (Deleted)
?  ?Cardiology Office Note ? ? ?Date:  07/15/2021  ? ?ID:  Stacey Ruiz, DOB 1975/10/05, MRN [id number] ? ?PCP:  Janith Lima, MD  ?Cardiologist:   Kyri Dai Martinique, MD  ? ?No chief complaint on file. ? ? ?  ?History of Present Illness: ?Stacey Ruiz is a 46 y.o. female who is seen at the request of Dr Ronnald Ramp for evaluation of tricuspid insufficiency. She has a history of HTN. She was seen in the ED in January this year with N/V and diarrhea. Was briefly in Afib with RVR but quickly converted to NSR. She was quite hypertensive but with starting back on he medication her BP was controlled. Seen in Afib clinic in February and doing well. Echo was done showing moderate TR otherwise normal. Event monitor showed no recurrent AFib.  ? ? ? ?Past Medical History:  ?Diagnosis Date  ? Anxiety   ? Depression   ? Headache(784.0)   ? migraines  ? Hypertension   ? ? ?Past Surgical History:  ?Procedure Laterality Date  ? ABLATION    ? FRACTURE SURGERY  2002  ? left wrist  ? TUBAL LIGATION  11/28/2010  ? Procedure: ESSURE TUBAL STERILIZATION;  Surgeon: Agnes Lawrence, MD;  Location: [facility];  Service: Gynecology;  Laterality: N/A;  Attempted essure sterilization. Essure device placed in leftt side only. could not do procedure on right fallopian tube.   ? TUBAL LIGATION  12/05/2010  ? Procedure: ESSURE TUBAL STERILIZATION;  Surgeon: Agnes Lawrence, MD;  Location: [facility];  Service: Gynecology;  Laterality: Right;  ? ? ? ?Current Outpatient Medications  ?Medication Sig Dispense Refill  ? amLODipine (NORVASC) 10 MG tablet Take 1 tablet (10 mg total) by mouth daily. 90 tablet 1  ? benzonatate (TESSALON PERLES) 100 MG capsule Take 1 capsule (100 mg total) by mouth 3 (three) times daily as needed. 30 capsule 0  ? diazepam (VALIUM) 5 MG tablet Take 5 mg by mouth as needed.    ? fluconazole (DIFLUCAN) 150 MG tablet Take 150 mg by mouth once. (Patient not taking: Reported on 05/14/2021)    ? ibuprofen (ADVIL) 600 MG tablet TAKE  1 TABLET(600 MG) BY MOUTH EVERY 8 HOURS AS NEEDED 90 tablet 1  ? triamterene-hydrochlorothiazide (DYAZIDE) 37.5-25 MG capsule Take 1 each (1 capsule total) by mouth daily. 90 capsule 1  ? ?No current facility-administered medications for this visit.  ? ? ?Allergies:   Hydrocodone and Oxycodone  ? ? ?Social History:  The patient  reports that she quit smoking about 13 years ago. Her smoking use included cigarettes. She has never used smokeless tobacco. She reports current alcohol use of about 7.0 standard drinks per week. She reports current drug use. Drug: Marijuana.  ? ?Family History:  The patient's family history includes Breast cancer (age of onset: 75) in her maternal aunt; Cancer in her mother.  ? ? ?ROS:  Please see the history of present illness.   Otherwise, review of systems are positive for none.   All other systems are reviewed and negative.  ? ? ?PHYSICAL EXAM: ?VS:  There were no vitals taken for this visit. , BMI There is no height or weight on file to calculate BMI. ?GEN: Well nourished, well developed, in no acute distress ?HEENT: normal ?Neck: no JVD, carotid bruits, or masses ?Cardiac: RRR; no murmurs, rubs, or gallops,no edema  ?Respiratory:  clear to auscultation bilaterally, normal work of breathing ?GI: soft, nontender, nondistended, + BS ?MS: no deformity  or atrophy ?Skin: warm and dry, no rash ?Neuro:  Strength and sensation are intact ?Psych: euthymic mood, full affect ? ? ?EKG:  EKG {ACTION; IS/IS FGH:[id number]} ordered today. ?The ekg ordered today demonstrates *** ? ? ?Recent Labs: ?05/05/2021: ALT 19; Magnesium 1.9 ?05/13/2021: BUN 12; Creatinine, Ser 0.87; Hemoglobin 13.7; Platelets 313.0; Potassium 3.7; Sodium 139; TSH 0.61  ? ? ?Lipid Panel ?   ?Component Value Date/Time  ? CHOL 155 05/13/2021 1132  ? TRIG 172.0 (H) 05/13/2021 1132  ? HDL 37.30 (L) 05/13/2021 1132  ? CHOLHDL 4 05/13/2021 1132  ? VLDL 34.4 05/13/2021 1132  ? [place] 83 05/13/2021 1132  ? ?  ? ?Wt Readings from Last 3  Encounters:  ?05/14/21 200 lb 9.6 oz (91 kg)  ?05/13/21 196 lb (88.9 kg)  ?06/24/20 191 lb (86.6 kg)  ?  ? ? ?Other studies Reviewed: ?Additional studies/ records that were reviewed today include:  ? ?Event monitor 05/28/21: Study Highlights ? ?Patch Wear Time:  7 days and 9 hours ?  ?Predominant underlying rhythm was sinus rhythm ?Less than 1% ventricular and supraventricular ectopy ?Events associated with sinus rhythm and sinus tachycardia ?  ?Will Curt Bears, MD ? ? ?Echo 06/09/21: IMPRESSIONS  ? ? ? 1. Left ventricular ejection fraction by 3D volume is 62 %. The left  ?ventricle has normal function. The left ventricle has no regional wall  ?motion abnormalities. There is mild concentric left ventricular  ?hypertrophy. Left ventricular diastolic  ?parameters are indeterminate.  ? 2. Right ventricular systolic function is normal. The right ventricular  ?size is normal. There is normal pulmonary artery systolic pressure.  ? 3. The mitral valve is normal in structure. Trivial mitral valve  ?regurgitation. No evidence of mitral stenosis.  ? 4. Tricuspid valve regurgitation is moderate.  ? 5. The aortic valve is normal in structure. Aortic valve regurgitation is  ?not visualized. No aortic stenosis is present.  ? 6. The inferior vena cava is normal in size with greater than 50%  ?respiratory variability, suggesting right atrial pressure of 3 mmHg.  ? ?Comparison(s): No prior Echocardiogram.  ? ? ?ASSESSMENT AND PLAN: ? ?1.  *** ? ? ?Current medicines are reviewed at length with the patient today.  The patient {ACTIONS; HAS/DOES NOT HAVE:19233} concerns regarding medicines. ? ?The following changes have been made:  {PLAN; NO CHANGE:13088:s} ? ?Labs/ tests ordered today include: *** ?No orders of the defined types were placed in this encounter. ? ? ?   ? ? ?Disposition:   FU with *** in {gen number [id number]} {Days to years:10300} ? ?Signed, ?Alashia Brownfield Martinique, MD  ?07/15/2021 2:51 PM    ?[facility] ?[address], [place], [place], [address] ?Phone [phone], Fax [phone] ? ? ?

## 2021-07-16 ENCOUNTER — Ambulatory Visit: Payer: BC Managed Care – PPO | Admitting: Cardiology

## 2021-07-23 ENCOUNTER — Ambulatory Visit
Admission: RE | Admit: 2021-07-23 | Discharge: 2021-07-23 | Disposition: A | Discharge status: Home / Self Care | Payer: BC Managed Care – PPO | Source: Ambulatory Visit | Attending: Chiropractic Medicine | Admitting: Chiropractic Medicine

## 2021-07-23 DIAGNOSIS — M19011 Primary osteoarthritis, right shoulder: Secondary | ICD-10-CM | POA: Diagnosis not present

## 2021-07-23 DIAGNOSIS — S46011A Strain of muscle(s) and tendon(s) of the rotator cuff of right shoulder, initial encounter: Secondary | ICD-10-CM | POA: Diagnosis not present

## 2021-07-23 DIAGNOSIS — M25511 Pain in right shoulder: Secondary | ICD-10-CM

## 2021-07-30 ENCOUNTER — Encounter: Payer: Self-pay | Admitting: Gastroenterology

## 2021-08-05 DIAGNOSIS — M25511 Pain in right shoulder: Secondary | ICD-10-CM | POA: Diagnosis not present

## 2021-09-02 ENCOUNTER — Ambulatory Visit (AMBULATORY_SURGERY_CENTER): Payer: Self-pay | Admitting: *Deleted

## 2021-09-02 VITALS — Ht 65.0 in | Wt 193.0 lb

## 2021-09-02 DIAGNOSIS — Z1211 Encounter for screening for malignant neoplasm of colon: Secondary | ICD-10-CM

## 2021-09-02 DIAGNOSIS — Z800 Family history of malignant neoplasm of digestive organs: Secondary | ICD-10-CM

## 2021-09-02 MED ORDER — NA SULFATE-K SULFATE-MG SULF 17.5-3.13-1.6 GM/177ML PO SOLN: 1.0000 | Freq: Once | ORAL | 0 refills | Status: AC

## 2021-09-02 NOTE — Progress Notes (Signed)

## 2021-09-18 ENCOUNTER — Encounter: Payer: Self-pay | Admitting: Gastroenterology

## 2021-09-22 ENCOUNTER — Encounter: Payer: Self-pay | Admitting: Gastroenterology

## 2021-09-22 ENCOUNTER — Ambulatory Visit (AMBULATORY_SURGERY_CENTER): Payer: BC Managed Care – PPO | Admitting: Gastroenterology

## 2021-09-22 VITALS — BP 106/74 | HR 62 | Temp 98.6°F | Resp 11 | Ht 65.0 in | Wt 193.0 lb

## 2021-09-22 DIAGNOSIS — K635 Polyp of colon: Secondary | ICD-10-CM | POA: Diagnosis not present

## 2021-09-22 DIAGNOSIS — Z800 Family history of malignant neoplasm of digestive organs: Secondary | ICD-10-CM

## 2021-09-22 DIAGNOSIS — Z1211 Encounter for screening for malignant neoplasm of colon: Secondary | ICD-10-CM | POA: Diagnosis not present

## 2021-09-22 DIAGNOSIS — D122 Benign neoplasm of ascending colon: Secondary | ICD-10-CM

## 2021-09-22 MED ORDER — SODIUM CHLORIDE 0.9 % IV SOLN: 500.0000 mL | Freq: Once | INTRAVENOUS | Status: DC

## 2021-09-22 NOTE — Patient Instructions (Signed)
YOU HAD AN ENDOSCOPIC PROCEDURE TODAY AT [facility]:   Refer to the procedure report that was given to you for any specific questions about what was found during the examination.  If the procedure report does not answer your questions, please call your gastroenterologist to clarify.  If you requested that your care partner not be given the details of your procedure findings, then the procedure report has been included in a sealed envelope for you to review at your convenience later.  YOU SHOULD EXPECT: Some feelings of bloating in the abdomen. Passage of more gas than usual.  Walking can help get rid of the air that was put into your GI tract during the procedure and reduce the bloating. If you had a lower endoscopy (such as a colonoscopy or flexible sigmoidoscopy) you may notice spotting of blood in your stool or on the toilet paper. If you underwent a bowel prep for your procedure, you may not have a normal bowel movement for a few days.  Please Note:  You might notice some irritation and congestion in your nose or some drainage.  This is from the oxygen used during your procedure.  There is no need for concern and it should clear up in a day or so.  SYMPTOMS TO REPORT IMMEDIATELY:  Following lower endoscopy (colonoscopy or flexible sigmoidoscopy):  Excessive amounts of blood in the stool  Significant tenderness or worsening of abdominal pains  Swelling of the abdomen that is new, acute  Fever of 100F or higher  Following upper endoscopy (EGD)  Vomiting of blood or coffee ground material  New chest pain or pain under the shoulder blades  Painful or persistently difficult swallowing  New shortness of breath  Fever of 100F or higher  Black, tarry-looking stools  For urgent or emergent issues, a gastroenterologist can be reached at any hour by calling [phone]. Do not use MyChart messaging for urgent concerns.    DIET:  We do recommend a small meal at first, but  then you may proceed to your regular diet.  Drink plenty of fluids but you should avoid alcoholic beverages for 24 hours.  ACTIVITY:  You should plan to take it easy for the rest of today and you should NOT DRIVE or use heavy machinery until tomorrow (because of the sedation medicines used during the test).    FOLLOW UP: Our staff will call the number listed on your records 24-72 hours following your procedure to check on you and address any questions or concerns that you may have regarding the information given to you following your procedure. If we do not reach you, we will leave a message.  We will attempt to reach you two times.  During this call, we will ask if you have developed any symptoms of COVID 19. If you develop any symptoms (ie: fever, flu-like symptoms, shortness of breath, cough etc.) before then, please call [phone].  If you test positive for Covid 19 in the 2 weeks post procedure, please call and report this information to [place].    If any biopsies were taken you will be contacted by phone or by letter within the next 1-3 weeks.  Please call us at [phone] if you have not heard about the biopsies in 3 weeks.    SIGNATURES/CONFIDENTIALITY: You and/or your care partner have signed paperwork which will be entered into your electronic medical record.  These signatures attest to the fact that that the information above on your After  Visit Summary has been reviewed and is understood.  Full responsibility of the confidentiality of this discharge information lies with you and/or your care-partner.

## 2021-09-22 NOTE — Progress Notes (Signed)
Called to room to assist during endoscopic procedure.  Patient ID and intended procedure confirmed with present staff. Received instructions for my participation in the procedure from the performing physician.  

## 2021-09-22 NOTE — Op Note (Signed)
[facility] Patient Name: Stacey Ruiz Procedure Date: 09/22/2021 9:01 AM MRN: [id number] Endoscopist: Mallie Mussel L. Loletha Carrow , MD Age: 46 Referring MD:  Date of Birth: Dec 12, 1975 Gender: Female Account #: [id number] Procedure:                Colonoscopy Indications:              Colon cancer screening in patient at increased                            risk: Colorectal cancer in mother, This is the                            patient's first colonoscopy Medicines:                Monitored Anesthesia Care Procedure:                Pre-Anesthesia Assessment:                           - Prior to the procedure, a History and Physical                            was performed, and patient medications and                            allergies were reviewed. The patient's tolerance of                            previous anesthesia was also reviewed. The risks                            and benefits of the procedure and the sedation                            options and risks were discussed with the patient.                            All questions were answered, and informed consent                            was obtained. Prior Anticoagulants: The patient has                            taken no previous anticoagulant or antiplatelet                            agents. ASA Grade Assessment: II - A patient with                            mild systemic disease. After reviewing the risks                            and benefits, the patient was deemed in  satisfactory condition to undergo the procedure.                           After obtaining informed consent, the colonoscope                            was passed under direct vision. Throughout the                            procedure, the patient's blood pressure, pulse, and                            oxygen saturations were monitored continuously. The                            CF [id number] #[id number] was introduced  through the anus                            and advanced to the the cecum, identified by                            appendiceal orifice and ileocecal valve. The                            colonoscopy was performed without difficulty. The                            patient tolerated the procedure well. The quality                            of the bowel preparation was excellent. The                            ileocecal valve, appendiceal orifice, and rectum                            were photographed. Scope In: 9:11:30 AM Scope Out: 9:23:06 AM Scope Withdrawal Time: 0 hours 8 minutes 3 seconds  Total Procedure Duration: 0 hours 11 minutes 36 seconds  Findings:                 The perianal and digital rectal examinations were                            normal.                           Repeat examination of right colon under NBI                            performed.                           A 5 mm polyp was found in the ascending colon. The  polyp was sessile. The polyp was removed with a                            cold snare. Resection and retrieval were complete.                           Multiple small-mouthed diverticula were found in                            the left colon.                           The exam was otherwise without abnormality on                            direct and retroflexion views. Complications:            No immediate complications. Estimated Blood Loss:     Estimated blood loss was minimal. Impression:               - One 5 mm polyp in the ascending colon, removed                            with a cold snare. Resected and retrieved.                           - Diverticulosis in the left colon.                           - The examination was otherwise normal on direct                            and retroflexion views. Recommendation:           - Patient has a contact number available for                            emergencies. The  signs and symptoms of potential                            delayed complications were discussed with the                            patient. Return to normal activities tomorrow.                            Written discharge instructions were provided to the                            patient.                           - Resume previous diet.                           - Continue present medications.                           -  Await pathology results.                           - Repeat colonoscopy in 5 years for polyp                            surveillance and family history of colon cancer. Fynn Adel L. Loletha Carrow, MD 09/22/2021 9:26:48 AM This report has been signed electronically.

## 2021-09-22 NOTE — Progress Notes (Signed)
To pacu, Vss. Report to RN.tb

## 2021-09-22 NOTE — Progress Notes (Signed)
Pt's states no medical or surgical changes since previsit or office visit. 

## 2021-09-22 NOTE — Progress Notes (Signed)
History and Physical:  This patient presents for endoscopic testing for: Encounter Diagnosis  Name Primary?   Family history of colon cancer in mother Yes    First screening colonoscopy.  Mother died from metastatic CRC Patient denies chronic abdominal pain, rectal bleeding, constipation or diarrhea.    Patient is otherwise without complaints or active issues today.   Past Medical History: Past Medical History:  Diagnosis Date   Anemia    Anxiety    Arthritis    knee right   Depression    Headache(784.0)    migraines   Hypertension      Past Surgical History: Past Surgical History:  Procedure Laterality Date   ABLATION     FRACTURE SURGERY  04/06/2000   left wrist   TUBAL LIGATION  11/28/2010   Procedure: ESSURE TUBAL STERILIZATION;  Surgeon: Agnes Lawrence, MD;  Location: [facility];  Service: Gynecology;  Laterality: N/A;  Attempted essure sterilization. Essure device placed in leftt side only. could not do procedure on right fallopian tube.    TUBAL LIGATION  12/05/2010   Procedure: ESSURE TUBAL STERILIZATION;  Surgeon: Agnes Lawrence, MD;  Location: [facility];  Service: Gynecology;  Laterality: Right;   WISDOM TOOTH EXTRACTION      Allergies: Allergies  Allergen Reactions   Hydrocodone Itching and Nausea And Vomiting   Oxycodone Itching and Nausea And Vomiting    Outpatient Meds: Current Outpatient Medications  Medication Sig Dispense Refill   amLODipine (NORVASC) 10 MG tablet Take 1 tablet (10 mg total) by mouth daily. 90 tablet 1   Ascorbic Acid (VITAMIN C PO) Take by mouth daily. Take one chewable tablet     Cholecalciferol (VITAMIN D3 PO) Take by mouth daily. Take 5000 iu     ibuprofen (ADVIL) 600 MG tablet TAKE 1 TABLET(600 MG) BY MOUTH EVERY 8 HOURS AS NEEDED 90 tablet 1   Multiple Vitamin (MULTIVITAMIN PO) Take by mouth daily. Petra Kuba own women take one daily     Multiple Vitamins-Minerals (ZINC PO) Take 50 mg by mouth. Take one pill daily      OVER THE COUNTER MEDICATION 500 mg daily. [place] tail mushroom vitamin-take one daily     OVER THE COUNTER MEDICATION Oil of oregano drops-take first 5 days of the month     triamterene-hydrochlorothiazide (DYAZIDE) 37.5-25 MG capsule Take 1 each (1 capsule total) by mouth daily. 90 capsule 1   benzonatate (TESSALON PERLES) 100 MG capsule Take 1 capsule (100 mg total) by mouth 3 (three) times daily as needed. 30 capsule 0   BLACK COHOSH PO Take by mouth daily. Take one     diazepam (VALIUM) 5 MG tablet Take 5 mg by mouth as needed.     Current Facility-Administered Medications  Medication Dose Route Frequency Provider Last Rate Last Admin   0.9 %  sodium chloride infusion  500 mL Intravenous Once Nelida Meuse III, MD          ___________________________________________________________________ Objective   Exam:  BP 103/69   Pulse 70   Temp 98.6 F (37 C)   Ht '5\' 5"'$  (1.651 m)   Wt 193 lb (87.5 kg)   SpO2 100%   BMI 32.12 kg/m   CV: RRR without murmur, S1/S2 Resp: clear to auscultation bilaterally, normal RR and effort noted GI: soft, no tenderness, with active bowel sounds.   Assessment: Encounter Diagnosis  Name Primary?   Family history of colon cancer in mother Yes     Plan: Colonoscopy  The benefits and risks of the planned procedure were described in detail with the patient or (when appropriate) their health care proxy.  Risks were outlined as including, but not limited to, bleeding, infection, perforation, adverse medication reaction leading to cardiac or pulmonary decompensation, pancreatitis (if ERCP).  The limitation of incomplete mucosal visualization was also discussed.  No guarantees or warranties were given.    The patient is appropriate for an endoscopic procedure in the ambulatory setting.   - Wilfrid Lund, MD

## 2021-09-23 ENCOUNTER — Telehealth: Payer: Self-pay | Admitting: *Deleted

## 2021-09-23 NOTE — Telephone Encounter (Signed)
  Follow up Call-     09/22/2021    8:12 AM  Call back number  Post procedure Call Back phone  # [phone]  Permission to leave phone message Yes     Patient questions:  Do you have a fever, pain , or abdominal swelling? No. Pain Score  0 *  Have you tolerated food without any problems? Yes.    Have you been able to return to your normal activities? Yes.    Do you have any questions about your discharge instructions: Diet   No. Medications  No. Follow up visit  No.  Do you have questions or concerns about your Care? No.  Actions: * If pain score is 4 or above: No action needed, pain <4.

## 2021-09-24 ENCOUNTER — Encounter: Payer: Self-pay | Admitting: Gastroenterology

## 2021-11-12 ENCOUNTER — Other Ambulatory Visit: Payer: Self-pay | Admitting: Internal Medicine

## 2021-11-12 DIAGNOSIS — Z1231 Encounter for screening mammogram for malignant neoplasm of breast: Secondary | ICD-10-CM

## 2021-11-25 ENCOUNTER — Ambulatory Visit
Admission: RE | Admit: 2021-11-25 | Discharge: 2021-11-25 | Disposition: A | Discharge status: Home / Self Care | Payer: BC Managed Care – PPO | Source: Ambulatory Visit | Attending: Internal Medicine | Admitting: Internal Medicine

## 2021-11-25 DIAGNOSIS — Z1231 Encounter for screening mammogram for malignant neoplasm of breast: Secondary | ICD-10-CM | POA: Diagnosis not present

## 2021-12-05 DIAGNOSIS — M2021 Hallux rigidus, right foot: Secondary | ICD-10-CM | POA: Diagnosis not present

## 2022-03-11 DIAGNOSIS — M2021 Hallux rigidus, right foot: Secondary | ICD-10-CM | POA: Diagnosis not present

## 2022-05-06 ENCOUNTER — Ambulatory Visit: Payer: BC Managed Care – PPO | Admitting: Internal Medicine

## 2022-05-06 ENCOUNTER — Encounter: Payer: Self-pay | Admitting: Internal Medicine

## 2022-05-06 VITALS — BP 126/76 | HR 78 | Temp 98.7°F | Ht 65.0 in | Wt 186.0 lb

## 2022-05-06 DIAGNOSIS — E559 Vitamin D deficiency, unspecified: Secondary | ICD-10-CM

## 2022-05-06 DIAGNOSIS — I10 Essential (primary) hypertension: Secondary | ICD-10-CM

## 2022-05-06 DIAGNOSIS — E611 Iron deficiency: Secondary | ICD-10-CM

## 2022-05-06 DIAGNOSIS — R739 Hyperglycemia, unspecified: Secondary | ICD-10-CM | POA: Diagnosis not present

## 2022-05-06 DIAGNOSIS — R109 Unspecified abdominal pain: Secondary | ICD-10-CM | POA: Diagnosis not present

## 2022-05-06 DIAGNOSIS — R1110 Vomiting, unspecified: Secondary | ICD-10-CM | POA: Diagnosis not present

## 2022-05-06 DIAGNOSIS — R197 Diarrhea, unspecified: Secondary | ICD-10-CM | POA: Diagnosis not present

## 2022-05-06 DIAGNOSIS — E538 Deficiency of other specified B group vitamins: Secondary | ICD-10-CM

## 2022-05-06 LAB — CBC WITH DIFFERENTIAL/PLATELET
Basophils Absolute: 0 10*3/uL (ref 0.0–0.1)
Basophils Relative: 0.8 % (ref 0.0–3.0)
Eosinophils Absolute: 0 10*3/uL (ref 0.0–0.7)
Eosinophils Relative: 0.2 % (ref 0.0–5.0)
HCT: 41.1 % (ref 36.0–46.0)
Hemoglobin: 14.2 g/dL (ref 12.0–15.0)
Lymphocytes Relative: 20.9 % (ref 12.0–46.0)
Lymphs Abs: 1.3 10*3/uL (ref 0.7–4.0)
MCHC: 34.7 g/dL (ref 30.0–36.0)
MCV: 85.8 fl (ref 78.0–100.0)
Monocytes Absolute: 0.5 10*3/uL (ref 0.1–1.0)
Monocytes Relative: 8.3 % (ref 3.0–12.0)
Neutro Abs: 4.2 10*3/uL (ref 1.4–7.7)
Neutrophils Relative %: 69.8 % (ref 43.0–77.0)
Platelets: 294 10*3/uL (ref 150.0–400.0)
RBC: 4.79 Mil/uL (ref 3.87–5.11)
RDW: 12.7 % (ref 11.5–15.5)
WBC: 6.1 10*3/uL (ref 4.0–10.5)

## 2022-05-06 LAB — URINALYSIS, ROUTINE W REFLEX MICROSCOPIC
Bilirubin Urine: NEGATIVE
Hgb urine dipstick: NEGATIVE
Ketones, ur: NEGATIVE
Leukocytes,Ua: NEGATIVE
Nitrite: NEGATIVE
Specific Gravity, Urine: 1.025 (ref 1.000–1.030)
Total Protein, Urine: NEGATIVE
Urine Glucose: NEGATIVE
Urobilinogen, UA: 1 (ref 0.0–1.0)
pH: 6 (ref 5.0–8.0)

## 2022-05-06 LAB — VITAMIN B12: Vitamin B-12: 393 pg/mL (ref 211–911)

## 2022-05-06 LAB — BASIC METABOLIC PANEL
BUN: 14 mg/dL (ref 6–23)
CO2: 32 mEq/L (ref 19–32)
Calcium: 10.2 mg/dL (ref 8.4–10.5)
Chloride: 97 mEq/L (ref 96–112)
Creatinine, Ser: 0.94 mg/dL (ref 0.40–1.20)
GFR: 72.02 mL/min (ref >60.00)
Glucose, Bld: 103 mg/dL — ABNORMAL HIGH (ref 70–99)
Potassium: 4.3 mEq/L (ref 3.5–5.1)
Sodium: 137 mEq/L (ref 135–145)

## 2022-05-06 LAB — LIPID PANEL
Cholesterol: 197 mg/dL (ref 0–200)
HDL: 47.6 mg/dL (ref >39.00)
LDL Cholesterol: 121 mg/dL — ABNORMAL HIGH (ref 0–99)
NonHDL: 149.88
Total CHOL/HDL Ratio: 4
Triglycerides: 142 mg/dL (ref 0.0–149.0)
VLDL: 28.4 mg/dL (ref 0.0–40.0)

## 2022-05-06 LAB — HEPATIC FUNCTION PANEL
ALT: 18 U/L (ref 0–35)
AST: 20 U/L (ref 0–37)
Albumin: 4.9 g/dL (ref 3.5–5.2)
Alkaline Phosphatase: 76 U/L (ref 39–117)
Bilirubin, Direct: 0.1 mg/dL (ref 0.0–0.3)
Total Bilirubin: 0.7 mg/dL (ref 0.2–1.2)
Total Protein: 8.5 g/dL — ABNORMAL HIGH (ref 6.0–8.3)

## 2022-05-06 LAB — IBC PANEL
Iron: 134 ug/dL (ref 42–145)
Saturation Ratios: 39.6 % (ref 20.0–50.0)
TIBC: 338.8 ug/dL (ref 250.0–450.0)
Transferrin: 242 mg/dL (ref 212.0–360.0)

## 2022-05-06 LAB — VITAMIN D 25 HYDROXY (VIT D DEFICIENCY, FRACTURES): VITD: 41.99 ng/mL (ref 30.00–100.00)

## 2022-05-06 LAB — TSH: TSH: 1.12 u[IU]/mL (ref 0.35–5.50)

## 2022-05-06 LAB — LIPASE: Lipase: 23 U/L (ref 11.0–59.0)

## 2022-05-06 LAB — H. PYLORI ANTIBODY, IGG: H Pylori IgG: NEGATIVE

## 2022-05-06 LAB — FERRITIN: Ferritin: 154.6 ng/mL (ref 10.0–291.0)

## 2022-05-06 LAB — HEMOGLOBIN A1C: Hgb A1c MFr Bld: 6.1 % (ref 4.6–6.5)

## 2022-05-06 MED ORDER — DICYCLOMINE HCL 10 MG PO CAPS
10.0000 mg | ORAL_CAPSULE | Freq: Three times a day (TID) | ORAL | 1 refills | Status: DC
Start: 2022-05-06 — End: 2022-06-09

## 2022-05-06 NOTE — Assessment & Plan Note (Signed)
BP Readings from Last 3 Encounters:  05/06/22 126/76  09/22/21 106/74  05/25/21 123/84   Stable, pt to continue medical treatment norvasc 10 qd, dyazide 1 qd

## 2022-05-06 NOTE — Assessment & Plan Note (Signed)
Last vitamin D Lab Results  Component Value Date   VD25OH 41.99 05/06/2022   Stable, cont oral replacement

## 2022-05-06 NOTE — Patient Instructions (Signed)
Please take all new medication as prescribed - the dicyclomine as needed for abd pains  Please continue all other medications as before, and refills have been done if requested.  Please have the pharmacy call with any other refills you may need.  Please continue your efforts at being more active, low cholesterol diet, and weight control.  You are otherwise up to date with prevention measures today.  Please keep your appointments with your specialists as you may have planned  You will be contacted regarding the referral for: Gastroenterology  Please go to the LAB at the blood drawing area for the tests to be done  You will be contacted by phone if any changes need to be made immediately.  Otherwise, you will receive a letter about your results with an explanation, but please check with MyChart first.  Please remember to sign up for MyChart if you have not done so, as this will be important to you in the future with finding out test results, communicating by private email, and scheduling acute appointments online when needed.

## 2022-05-06 NOTE — Progress Notes (Signed)
Patient ID: AGAM TUOHY, female   DOB: 01-Nov-1975, 47 y.o.   MRN: [id number]        Chief Complaint: follow up abd pain, hyperglycemia, htn       HPI:  Stacey Ruiz is a 47 y.o. female here with c/o > 1 yr onset generalized abd pain mild to mod intermittent , assoc with sweats, nausea, and diarrhea, without fever, vomiting or blood.  Dietary changes or eating do not seem to make worse.  No ETOH use, takes advil prn very infrequently, does have occasional marijuana gummy, denies constipation.  Now s/p colonoscopy June 2023 with polyp.  TUMS yesterday did not help.  Pt denies chest pain, increased sob or doe, wheezing, orthopnea, PND, increased LE swelling, palpitations, dizziness or syncope.   Pt denies polydipsia, polyuria, or new focal neuro s/s.    Pt denies fever, wt loss, night sweats, loss of appetite, or other constitutional symptoms         Wt Readings from Last 3 Encounters:  05/06/22 186 lb (84.4 kg)  09/22/21 193 lb (87.5 kg)  09/02/21 193 lb (87.5 kg)   BP Readings from Last 3 Encounters:  05/06/22 126/76  09/22/21 106/74  05/25/21 123/84         Past Medical History:  Diagnosis Date   Anemia    Anxiety    Arthritis    knee right   Depression    Headache(784.0)    migraines   Hypertension    Past Surgical History:  Procedure Laterality Date   ABLATION     FRACTURE SURGERY  04/06/2000   left wrist   TUBAL LIGATION  11/28/2010   Procedure: ESSURE TUBAL STERILIZATION;  Surgeon: Agnes Lawrence, MD;  Location: [facility];  Service: Gynecology;  Laterality: N/A;  Attempted essure sterilization. Essure device placed in leftt side only. could not do procedure on right fallopian tube.    TUBAL LIGATION  12/05/2010   Procedure: ESSURE TUBAL STERILIZATION;  Surgeon: Agnes Lawrence, MD;  Location: [facility];  Service: Gynecology;  Laterality: Right;   WISDOM TOOTH EXTRACTION      reports that she quit smoking about 14 years ago. Her smoking use included  cigarettes. She has never been exposed to tobacco smoke. She has never used smokeless tobacco. She reports current alcohol use of about 7.0 standard drinks of alcohol per week. She reports current drug use. Drug: Marijuana. family history includes Breast cancer (age of onset: 43) in her maternal aunt; Cancer in her mother; Colon cancer in her mother. Allergies  Allergen Reactions   Hydrocodone Itching and Nausea And Vomiting   Oxycodone Itching and Nausea And Vomiting   Current Outpatient Medications on File Prior to Visit  Medication Sig Dispense Refill   amLODipine (NORVASC) 10 MG tablet Take 1 tablet (10 mg total) by mouth daily. 90 tablet 1   Ascorbic Acid (VITAMIN C PO) Take by mouth daily. Take one chewable tablet     benzonatate (TESSALON PERLES) 100 MG capsule Take 1 capsule (100 mg total) by mouth 3 (three) times daily as needed. 30 capsule 0   BLACK COHOSH PO Take by mouth daily. Take one     Cholecalciferol (VITAMIN D3 PO) Take by mouth daily. Take 5000 iu     diazepam (VALIUM) 5 MG tablet Take 5 mg by mouth as needed.     ibuprofen (ADVIL) 600 MG tablet TAKE 1 TABLET(600 MG) BY MOUTH EVERY 8 HOURS AS NEEDED 90 tablet 1  Multiple Vitamin (MULTIVITAMIN PO) Take by mouth daily. Petra Kuba own women take one daily     Multiple Vitamins-Minerals (ZINC PO) Take 50 mg by mouth. Take one pill daily     OVER THE COUNTER MEDICATION 500 mg daily. [place] tail mushroom vitamin-take one daily     OVER THE COUNTER MEDICATION Oil of oregano drops-take first 5 days of the month     triamterene-hydrochlorothiazide (DYAZIDE) 37.5-25 MG capsule Take 1 each (1 capsule total) by mouth daily. 90 capsule 1   No current facility-administered medications on file prior to visit.        ROS:  All others reviewed and negative.  Objective        PE:  BP 126/76 (BP Location: Left Arm, Patient Position: Sitting, Cuff Size: Large)   Pulse 78   Temp 98.7 F (37.1 C) (Oral)   Ht '5\' 5"'$  (1.651 m)   Wt 186 lb  (84.4 kg)   BMI 30.95 kg/m                 Constitutional: Pt appears in NAD               HENT: Head: NCAT.                Right Ear: External ear normal.                 Left Ear: External ear normal.                Eyes: . Pupils are equal, round, and reactive to light. Conjunctivae and EOM are normal               Nose: without d/c or deformity               Neck: Neck supple. Gross normal ROM               Cardiovascular: Normal rate and regular rhythm.                 Pulmonary/Chest: Effort normal and breath sounds without rales or wheezing.                Abd:  Soft, NT, ND, + BS, no organomegaly               Neurological: Pt is alert. At baseline orientation, motor grossly intact               Skin: Skin is warm. No rashes, no other new lesions, LE edema - none               Psychiatric: Pt behavior is normal without agitation   Micro: none  Cardiac tracings I have personally interpreted today:  none  Pertinent Radiological findings (summarize): none   Lab Results  Component Value Date   WBC 6.1 05/06/2022   HGB 14.2 05/06/2022   HCT 41.1 05/06/2022   PLT 294.0 05/06/2022   GLUCOSE 103 (H) 05/06/2022   CHOL 197 05/06/2022   TRIG 142.0 05/06/2022   HDL 47.60 05/06/2022   LDLCALC 121 (H) 05/06/2022   ALT 18 05/06/2022   AST 20 05/06/2022   NA 137 05/06/2022   K 4.3 05/06/2022   CL 97 05/06/2022   CREATININE 0.94 05/06/2022   BUN 14 05/06/2022   CO2 32 05/06/2022   TSH 1.12 05/06/2022   INR 0.9 05/20/2011   HGBA1C 6.1 05/06/2022   Assessment/Plan:  Stacey Ruiz is a 47 y.o. Black  or African American [2] female with  has a past medical history of Anemia, Anxiety, Arthritis, Depression, Headache(784.0), and Hypertension.  Abdominal pain, vomiting, and diarrhea I suspect most c/w IBS, for dicyclomine 10 mg prn trial, labs today including cbc, GI referral  Essential hypertension, benign BP Readings from Last 3 Encounters:  05/06/22 126/76  09/22/21  106/74  05/25/21 123/84   Stable, pt to continue medical treatment norvasc 10 qd, dyazide 1 qd   Vitamin D deficiency Last vitamin D Lab Results  Component Value Date   VD25OH 41.99 05/06/2022   Stable, cont oral replacement   Hyperglycemia Lab Results  Component Value Date   HGBA1C 6.1 05/06/2022   Stable, pt to continue current medical treatment   - diet, wt control  Followup: Return if symptoms worsen or fail to improve.  Cathlean Cower, MD 05/06/2022 9:08 PM [facility]

## 2022-05-06 NOTE — Assessment & Plan Note (Signed)
Lab Results  Component Value Date   HGBA1C 6.1 05/06/2022   Stable, pt to continue current medical treatment   - diet, wt control

## 2022-05-06 NOTE — Assessment & Plan Note (Signed)
I suspect most c/w IBS, for dicyclomine 10 mg prn trial, labs today including cbc, GI referral

## 2022-05-11 ENCOUNTER — Ambulatory Visit: Payer: BC Managed Care – PPO | Admitting: Gastroenterology

## 2022-05-11 NOTE — Progress Notes (Deleted)
[facility] Consult Note:  History: Stacey Ruiz 05/11/2022  Referring provider: Janith Lima, MD  Reason for consult/chief complaint: No chief complaint on file.   Subjective  HPI: [name] saw primary care last month reporting over a year of generalized abdominal pain with nausea vomiting diarrhea and sweats.  PCP suspected IBS, prescribed dicyclomine. ***  Family history of colon cancer in mother -for screening colonoscopy with Dr. Loletha Carrow June 2023.  Diverticulosis, diminutive ascending colon SSP without dysplasia, 5-year recall recommended. ROS:  Review of Systems   Past Medical History: Past Medical History:  Diagnosis Date   Anemia    Anxiety    Arthritis    knee right   Depression    Headache(784.0)    migraines   Hypertension      Past Surgical History: Past Surgical History:  Procedure Laterality Date   ABLATION     FRACTURE SURGERY  04/06/2000   left wrist   TUBAL LIGATION  11/28/2010   Procedure: ESSURE TUBAL STERILIZATION;  Surgeon: Agnes Lawrence, MD;  Location: [facility];  Service: Gynecology;  Laterality: N/A;  Attempted essure sterilization. Essure device placed in leftt side only. could not do procedure on right fallopian tube.    TUBAL LIGATION  12/05/2010   Procedure: ESSURE TUBAL STERILIZATION;  Surgeon: Agnes Lawrence, MD;  Location: [facility];  Service: Gynecology;  Laterality: Right;   WISDOM TOOTH EXTRACTION       Family History: Family History  Problem Relation Age of Onset   Colon cancer Mother    Cancer Mother    Breast cancer Maternal Aunt 31   Diabetes Neg Hx    Early death Neg Hx    Heart disease Neg Hx    Hyperlipidemia Neg Hx    Hypertension Neg Hx    Kidney disease Neg Hx    Stroke Neg Hx    Crohn's disease Neg Hx    Esophageal cancer Neg Hx    Rectal cancer Neg Hx    Stomach cancer Neg Hx     Social History: Social History   Socioeconomic History   Marital status: Widowed     Spouse name: Not on file   Number of children: Not on file   Years of education: Not on file   Highest education level: Not on file  Occupational History   Not on file  Tobacco Use   Smoking status: Former    Types: Cigarettes    Quit date: 11/12/2007    Years since quitting: 14.5    Passive exposure: Never   Smokeless tobacco: Never  Vaping Use   Vaping Use: Never used  Substance and Sexual Activity   Alcohol use: Yes    Alcohol/week: 7.0 standard drinks of alcohol    Types: 7 Glasses of wine per week    Comment: occ   Drug use: Yes    Types: Marijuana    Comment: yesterday 09/21/21   Sexual activity: Never    Birth control/protection: Surgical  Other Topics Concern   Not on file  Social History Narrative   Not on file   Social Determinants of Health   Financial Resource Strain: Not on file  Food Insecurity: Not on file  Transportation Needs: Not on file  Physical Activity: Not on file  Stress: Not on file  Social Connections: Not on file    Allergies: Allergies  Allergen Reactions   Hydrocodone Itching and Nausea And Vomiting   Oxycodone Itching and Nausea And  Vomiting    Outpatient Meds: Current Outpatient Medications  Medication Sig Dispense Refill   amLODipine (NORVASC) 10 MG tablet Take 1 tablet (10 mg total) by mouth daily. 90 tablet 1   Ascorbic Acid (VITAMIN C PO) Take by mouth daily. Take one chewable tablet     benzonatate (TESSALON PERLES) 100 MG capsule Take 1 capsule (100 mg total) by mouth 3 (three) times daily as needed. 30 capsule 0   BLACK COHOSH PO Take by mouth daily. Take one     Cholecalciferol (VITAMIN D3 PO) Take by mouth daily. Take 5000 iu     diazepam (VALIUM) 5 MG tablet Take 5 mg by mouth as needed.     dicyclomine (BENTYL) 10 MG capsule Take 1 capsule (10 mg total) by mouth 4 (four) times daily -  before meals and at bedtime. 90 capsule 1   ibuprofen (ADVIL) 600 MG tablet TAKE 1 TABLET(600 MG) BY MOUTH EVERY 8 HOURS AS NEEDED 90  tablet 1   Multiple Vitamin (MULTIVITAMIN PO) Take by mouth daily. Petra Kuba own women take one daily     Multiple Vitamins-Minerals (ZINC PO) Take 50 mg by mouth. Take one pill daily     OVER THE COUNTER MEDICATION 500 mg daily. [place] tail mushroom vitamin-take one daily     OVER THE COUNTER MEDICATION Oil of oregano drops-take first 5 days of the month     triamterene-hydrochlorothiazide (DYAZIDE) 37.5-25 MG capsule Take 1 each (1 capsule total) by mouth daily. 90 capsule 1   No current facility-administered medications for this visit.      ___________________________________________________________________ Objective   Exam:  There were no vitals taken for this visit. Wt Readings from Last 3 Encounters:  05/06/22 186 lb (84.4 kg)  09/22/21 193 lb (87.5 kg)  09/02/21 193 lb (87.5 kg)    General: ***  Eyes: sclera anicteric, no redness ENT: oral mucosa moist without lesions, no cervical or supraclavicular lymphadenopathy CV: ***, no JVD, no peripheral edema Resp: clear to auscultation bilaterally, normal RR and effort noted GI: soft, *** tenderness, with active bowel sounds. No guarding or palpable organomegaly noted. Skin; warm and dry, no rash or jaundice noted Neuro: awake, alert and oriented x 3. Normal gross motor function and fluent speech  Labs:     Latest Ref Rng & Units 05/06/2022    9:59 AM 05/13/2021   11:32 AM 05/05/2021   11:05 PM  CBC  WBC 4.0 - 10.5 K/uL 6.1  5.5  11.3   Hemoglobin 12.0 - 15.0 g/dL 14.2  13.7  16.2   Hematocrit 36.0 - 46.0 % 41.1  41.0  46.4   Platelets 150.0 - 400.0 K/uL 294.0  313.0  389       Latest Ref Rng & Units 05/06/2022    9:59 AM 05/13/2021   11:32 AM 05/05/2021   11:05 PM  CMP  Glucose 70 - 99 mg/dL 103  123  169   BUN 6 - 23 mg/dL '14  12  5   '$ Creatinine 0.40 - 1.20 mg/dL 0.94  0.87  0.77   Sodium 135 - 145 mEq/L 137  139  135   Potassium 3.5 - 5.1 mEq/L 4.3  3.7  3.8   Chloride 96 - 112 mEq/L 97  96  99   CO2 19 - 32 mEq/L 32   38  19   Calcium 8.4 - 10.5 mg/dL 10.2  10.1  9.8   Total Protein 6.0 - 8.3 g/dL 8.5   9.0  Total Bilirubin 0.2 - 1.2 mg/dL 0.7   0.8   Alkaline Phos 39 - 117 U/L 76   89   AST 0 - 37 U/L 20   24   ALT 0 - 35 U/L 18   19      Radiologic Studies:  ***  Assessment: No diagnosis found.  ***  Plan:  ***  Thank you for the courtesy of this consult.  Please call me with any questions or concerns.  Nelida Meuse III  CC: Referring provider noted above

## 2022-05-14 ENCOUNTER — Encounter (HOSPITAL_COMMUNITY): Payer: Self-pay | Admitting: *Deleted

## 2022-06-09 ENCOUNTER — Encounter: Payer: Self-pay | Admitting: Internal Medicine

## 2022-06-09 ENCOUNTER — Ambulatory Visit (INDEPENDENT_AMBULATORY_CARE_PROVIDER_SITE_OTHER): Payer: BC Managed Care – PPO | Admitting: Internal Medicine

## 2022-06-09 VITALS — BP 120/82 | HR 83 | Temp 98.2°F | Resp 16 | Ht 65.0 in | Wt 194.0 lb

## 2022-06-09 DIAGNOSIS — I10 Essential (primary) hypertension: Secondary | ICD-10-CM | POA: Diagnosis not present

## 2022-06-09 DIAGNOSIS — Z0001 Encounter for general adult medical examination with abnormal findings: Secondary | ICD-10-CM | POA: Diagnosis not present

## 2022-06-09 DIAGNOSIS — R7303 Prediabetes: Secondary | ICD-10-CM | POA: Diagnosis not present

## 2022-06-09 NOTE — Progress Notes (Signed)
Subjective:  Patient ID: Stacey Ruiz, female    DOB: 11/17/1975  Age: 47 y.o. MRN: [id number]  CC: Annual Exam and Hypertension   HPI Aubrionna H Greaney presents for a CPX and f/up ----  She is active and denies chest pain, shortness of breath, diaphoresis, or edema.  Outpatient Medications Prior to Visit  Medication Sig Dispense Refill   Ascorbic Acid (VITAMIN C PO) Take by mouth daily. Take one chewable tablet     BLACK COHOSH PO Take by mouth daily. Take one     Cholecalciferol (VITAMIN D3 PO) Take by mouth daily. Take 5000 iu     ibuprofen (ADVIL) 600 MG tablet TAKE 1 TABLET(600 MG) BY MOUTH EVERY 8 HOURS AS NEEDED 90 tablet 1   Multiple Vitamin (MULTIVITAMIN PO) Take by mouth daily. Petra Kuba own women take one daily     Multiple Vitamins-Minerals (ZINC PO) Take 50 mg by mouth. Take one pill daily     OVER THE COUNTER MEDICATION 500 mg daily. [place] tail mushroom vitamin-take one daily     OVER THE COUNTER MEDICATION Oil of oregano drops-take first 5 days of the month     triamterene-hydrochlorothiazide (DYAZIDE) 37.5-25 MG capsule Take 1 each (1 capsule total) by mouth daily. 90 capsule 1   amLODipine (NORVASC) 10 MG tablet Take 1 tablet (10 mg total) by mouth daily. 90 tablet 1   benzonatate (TESSALON PERLES) 100 MG capsule Take 1 capsule (100 mg total) by mouth 3 (three) times daily as needed. (Patient not taking: Reported on 06/09/2022) 30 capsule 0   diazepam (VALIUM) 5 MG tablet Take 5 mg by mouth as needed. (Patient not taking: Reported on 06/09/2022)     dicyclomine (BENTYL) 10 MG capsule Take 1 capsule (10 mg total) by mouth 4 (four) times daily -  before meals and at bedtime. (Patient not taking: Reported on 06/09/2022) 90 capsule 1   No facility-administered medications prior to visit.    ROS Review of Systems  Constitutional: Negative.  Negative for diaphoresis and fatigue.  HENT: Negative.    Eyes: Negative.   Respiratory:  Negative for cough, chest tightness,  shortness of breath and wheezing.   Cardiovascular:  Negative for chest pain, palpitations and leg swelling.  Gastrointestinal:  Negative for abdominal pain, constipation, diarrhea and vomiting.  Endocrine: Negative.   Genitourinary: Negative.  Negative for difficulty urinating.  Musculoskeletal: Negative.   Skin: Negative.   Neurological:  Negative for dizziness and weakness.  Hematological:  Negative for adenopathy. Does not bruise/bleed easily.  Psychiatric/Behavioral: Negative.      Objective:  BP 120/82 (BP Location: Left Arm, Patient Position: Sitting, Cuff Size: Normal)   Pulse 83   Temp 98.2 F (36.8 C) (Oral)   Resp 16   Ht '5\' 5"'$  (1.651 m)   Wt 194 lb (88 kg)   LMP  (LMP Unknown)   SpO2 99%   BMI 32.28 kg/m   BP Readings from Last 3 Encounters:  06/15/22 107/73  06/09/22 120/82  05/06/22 126/76    Wt Readings from Last 3 Encounters:  06/15/22 193 lb (87.5 kg)  06/09/22 194 lb (88 kg)  05/06/22 186 lb (84.4 kg)    Physical Exam Vitals reviewed.  Constitutional:      Appearance: Normal appearance.  HENT:     Nose: Nose normal.     Mouth/Throat:     Mouth: Mucous membranes are moist.  Eyes:     General: No scleral icterus.    Conjunctiva/sclera: Conjunctivae  normal.  Cardiovascular:     Rate and Rhythm: Normal rate and regular rhythm.     Pulses: Normal pulses.     Heart sounds: No murmur heard.    No friction rub. No gallop.  Pulmonary:     Effort: Pulmonary effort is normal.     Breath sounds: No stridor. No wheezing, rhonchi or rales.  Abdominal:     General: Abdomen is flat.     Palpations: There is no mass.     Tenderness: There is no abdominal tenderness. There is no guarding.     Hernia: No hernia is present.  Musculoskeletal:        General: Normal range of motion.     Cervical back: Neck supple.     Right lower leg: No edema.     Left lower leg: No edema.  Lymphadenopathy:     Cervical: No cervical adenopathy.  Skin:    General: Skin  is warm and dry.     Findings: No lesion.  Neurological:     General: No focal deficit present.     Mental Status: She is alert. Mental status is at baseline.  Psychiatric:        Mood and Affect: Mood normal.        Behavior: Behavior normal.     Lab Results  Component Value Date   WBC 6.1 05/06/2022   HGB 14.2 05/06/2022   HCT 41.1 05/06/2022   PLT 294.0 05/06/2022   GLUCOSE 103 (H) 05/06/2022   CHOL 197 05/06/2022   TRIG 142.0 05/06/2022   HDL 47.60 05/06/2022   LDLCALC 121 (H) 05/06/2022   ALT 18 05/06/2022   AST 20 05/06/2022   NA 137 05/06/2022   K 4.3 05/06/2022   CL 97 05/06/2022   CREATININE 0.94 05/06/2022   BUN 14 05/06/2022   CO2 32 05/06/2022   TSH 1.12 05/06/2022   INR 0.9 05/20/2011   HGBA1C 6.1 05/06/2022    MM 3D SCREEN BREAST BILATERAL  Result Date: 11/26/2021 CLINICAL DATA:  Screening. EXAM: DIGITAL SCREENING BILATERAL MAMMOGRAM WITH TOMOSYNTHESIS AND CAD TECHNIQUE: Bilateral screening digital craniocaudal and mediolateral oblique mammograms were obtained. Bilateral screening digital breast tomosynthesis was performed. The images were evaluated with computer-aided detection. COMPARISON:  Previous exam(s). ACR Breast Density Category b: There are scattered areas of fibroglandular density. FINDINGS: There are no findings suspicious for malignancy. IMPRESSION: No mammographic evidence of malignancy. A result letter of this screening mammogram will be mailed directly to the patient. RECOMMENDATION: Screening mammogram in one year. (Code:SM-B-01Y) BI-RADS CATEGORY  1: Negative. Electronically Signed   By: Everlean Alstrom M.D.   On: 11/26/2021 14:32    Assessment & Plan:   Audriella was seen today for annual exam and hypertension.  Diagnoses and all orders for this visit:  Essential hypertension, benign- Her blood pressure is adequately well-controlled.  Encounter for general adult medical examination with abnormal findings- Exam completed, labs reviewed,  vaccines reviewed, cancer screenings are up-to-date, patient education was given.  Prediabetes- She agrees to work on her lifestyle modifications.   I have discontinued Pooja H. Kloss's amLODipine, benzonatate, diazepam, and dicyclomine. I am also having her maintain her ibuprofen, triamterene-hydrochlorothiazide, BLACK COHOSH PO, Ascorbic Acid (VITAMIN C PO), Cholecalciferol (VITAMIN D3 PO), Multiple Vitamin (MULTIVITAMIN PO), OVER THE COUNTER MEDICATION, Multiple Vitamins-Minerals (ZINC PO), and OVER THE COUNTER MEDICATION.  No orders of the defined types were placed in this encounter.    Follow-up: Return in about 6 months (around 12/10/2022).  Scarlette Calico, MD

## 2022-06-09 NOTE — Patient Instructions (Signed)

## 2022-06-15 ENCOUNTER — Other Ambulatory Visit (HOSPITAL_COMMUNITY)
Admission: RE | Admit: 2022-06-15 | Discharge: 2022-06-15 | Disposition: A | Discharge status: Home / Self Care | Payer: BC Managed Care – PPO | Source: Ambulatory Visit | Attending: Obstetrics and Gynecology | Admitting: Obstetrics and Gynecology

## 2022-06-15 ENCOUNTER — Encounter: Payer: Self-pay | Admitting: Obstetrics and Gynecology

## 2022-06-15 ENCOUNTER — Ambulatory Visit (INDEPENDENT_AMBULATORY_CARE_PROVIDER_SITE_OTHER): Payer: BC Managed Care – PPO | Admitting: Obstetrics and Gynecology

## 2022-06-15 VITALS — BP 107/73 | HR 80 | Ht 65.0 in | Wt 193.0 lb

## 2022-06-15 DIAGNOSIS — Z01419 Encounter for gynecological examination (general) (routine) without abnormal findings: Secondary | ICD-10-CM | POA: Insufficient documentation

## 2022-06-15 NOTE — Progress Notes (Signed)
Subjective:     Stacey Ruiz is a 47 y.o. female with BMI 32 and amenorrhea for almost a year who is here for a comprehensive physical exam. The patient reports no problems. She denies pelvic pain or abnormal discharge. She is not sexually active. She denies urinary complaints or constipation. She admits to some occasional vasomotor symptoms which she manages with supplements. She is actively changing her diet and lifestyle to improve A1c value.   Past Medical History:  Diagnosis Date   Anemia    Anxiety    Arthritis    knee right   Depression    Headache(784.0)    migraines   Hypertension    Past Surgical History:  Procedure Laterality Date   ABLATION     FRACTURE SURGERY  04/06/2000   left wrist   TUBAL LIGATION  11/28/2010   Procedure: ESSURE TUBAL STERILIZATION;  Surgeon: Agnes Lawrence, MD;  Location: [facility];  Service: Gynecology;  Laterality: N/A;  Attempted essure sterilization. Essure device placed in leftt side only. could not do procedure on right fallopian tube.    TUBAL LIGATION  12/05/2010   Procedure: ESSURE TUBAL STERILIZATION;  Surgeon: Agnes Lawrence, MD;  Location: [facility];  Service: Gynecology;  Laterality: Right;   WISDOM TOOTH EXTRACTION     Family History  Problem Relation Age of Onset   Colon cancer Mother    Cancer Mother    Breast cancer Maternal Aunt 41   Diabetes Neg Hx    Early death Neg Hx    Heart disease Neg Hx    Hyperlipidemia Neg Hx    Hypertension Neg Hx    Kidney disease Neg Hx    Stroke Neg Hx    Crohn's disease Neg Hx    Esophageal cancer Neg Hx    Rectal cancer Neg Hx    Stomach cancer Neg Hx     Social History   Socioeconomic History   Marital status: Widowed    Spouse name: Not on file   Number of children: Not on file   Years of education: Not on file   Highest education level: Not on file  Occupational History   Not on file  Tobacco Use   Smoking status: Former    Types: Cigarettes    Quit date:  11/12/2007    Years since quitting: 14.6    Passive exposure: Never   Smokeless tobacco: Never  Vaping Use   Vaping Use: Never used  Substance and Sexual Activity   Alcohol use: Yes    Alcohol/week: 7.0 standard drinks of alcohol    Types: 7 Glasses of wine per week    Comment: occ   Drug use: Yes    Types: Marijuana    Comment: yesterday 09/21/21   Sexual activity: Never    Birth control/protection: Surgical  Other Topics Concern   Not on file  Social History Narrative   Not on file   Social Determinants of Health   Financial Resource Strain: Not on file  Food Insecurity: Not on file  Transportation Needs: Not on file  Physical Activity: Not on file  Stress: Not on file  Social Connections: Not on file  Intimate Partner Violence: Not on file   Health Maintenance  Topic Date Due   INFLUENZA VACCINE  07/05/2022 (Originally 11/04/2021)   COVID-19 Vaccine (1) 12/01/2022 (Originally 11/13/1975)   PAP SMEAR-Modifier  06/25/2023   COLONOSCOPY (Pts 45-68yr Insurance coverage will need to be confirmed)  09/23/2026  DTaP/Tdap/Td (3 - Td or Tdap) 05/14/2031   Hepatitis C Screening  Completed   HIV Screening  Completed   HPV VACCINES  Aged Out       Review of Systems Pertinent items noted in HPI and remainder of comprehensive ROS otherwise negative.   Objective:  Blood pressure 107/73, pulse 80, height '5\' 5"'$  (1.651 m), weight 193 lb (87.5 kg).   GENERAL: Well-developed, well-nourished female in no acute distress.  HEENT: Normocephalic, atraumatic. Sclerae anicteric.  NECK: Supple. Normal thyroid.  LUNGS: Clear to auscultation bilaterally.  HEART: Regular rate and rhythm. BREASTS: Symmetric in size. No palpable masses or lymphadenopathy, skin changes, or nipple drainage. ABDOMEN: Soft, nontender, nondistended. No organomegaly. PELVIC: Normal external female genitalia. Vagina is pink and rugated.  Normal discharge. Normal appearing cervix. Uterus is normal in size. No adnexal  mass or tenderness. Chaperone present during the pelvic exam EXTREMITIES: No cyanosis, clubbing, or edema, 2+ distal pulses.     Assessment:    Healthy female exam.      Plan:    Pap smear collected  Normal mammogram 11/2021 Current on colonoscopy 09/2021 Health maintenance labs done in January 2024 Patient will be contacted with abnormal results See After Visit Summary for Counseling Recommendations

## 2022-06-17 LAB — CYTOLOGY - PAP
Adequacy: ABSENT
Comment: NEGATIVE
Diagnosis: NEGATIVE
High risk HPV: NEGATIVE

## 2022-06-18 ENCOUNTER — Encounter: Payer: Self-pay | Admitting: Internal Medicine

## 2022-06-18 DIAGNOSIS — R7303 Prediabetes: Secondary | ICD-10-CM | POA: Insufficient documentation

## 2022-08-12 DIAGNOSIS — M2021 Hallux rigidus, right foot: Secondary | ICD-10-CM | POA: Diagnosis not present

## 2022-11-19 ENCOUNTER — Encounter (INDEPENDENT_AMBULATORY_CARE_PROVIDER_SITE_OTHER): Payer: Self-pay

## 2022-12-10 ENCOUNTER — Ambulatory Visit: Payer: BC Managed Care – PPO | Admitting: Internal Medicine

## 2023-01-12 ENCOUNTER — Encounter: Payer: Self-pay | Admitting: Internal Medicine

## 2023-01-12 ENCOUNTER — Ambulatory Visit: Payer: BC Managed Care – PPO | Admitting: Internal Medicine

## 2023-01-12 VITALS — BP 164/96 | HR 71 | Temp 98.2°F | Resp 16 | Ht 65.0 in | Wt 195.0 lb

## 2023-01-12 DIAGNOSIS — R7303 Prediabetes: Secondary | ICD-10-CM | POA: Diagnosis not present

## 2023-01-12 DIAGNOSIS — E876 Hypokalemia: Secondary | ICD-10-CM

## 2023-01-12 DIAGNOSIS — I10 Essential (primary) hypertension: Secondary | ICD-10-CM | POA: Diagnosis not present

## 2023-01-12 DIAGNOSIS — R0609 Other forms of dyspnea: Secondary | ICD-10-CM | POA: Insufficient documentation

## 2023-01-12 LAB — CBC WITH DIFFERENTIAL/PLATELET
Basophils Absolute: 0 10*3/uL (ref 0.0–0.1)
Basophils Relative: 0.9 % (ref 0.0–3.0)
Eosinophils Absolute: 0.1 10*3/uL (ref 0.0–0.7)
Eosinophils Relative: 2.1 % (ref 0.0–5.0)
HCT: 45.8 % (ref 36.0–46.0)
Hemoglobin: 15.3 g/dL — ABNORMAL HIGH (ref 12.0–15.0)
Lymphocytes Relative: 46.3 % — ABNORMAL HIGH (ref 12.0–46.0)
Lymphs Abs: 2.5 10*3/uL (ref 0.7–4.0)
MCHC: 33.4 g/dL (ref 30.0–36.0)
MCV: 87.4 fL (ref 78.0–100.0)
Monocytes Absolute: 0.5 10*3/uL (ref 0.1–1.0)
Monocytes Relative: 9 % (ref 3.0–12.0)
Neutro Abs: 2.2 10*3/uL (ref 1.4–7.7)
Neutrophils Relative %: 41.7 % — ABNORMAL LOW (ref 43.0–77.0)
Platelets: 307 10*3/uL (ref 150.0–400.0)
RBC: 5.24 Mil/uL — ABNORMAL HIGH (ref 3.87–5.11)
RDW: 13.1 % (ref 11.5–15.5)
WBC: 5.3 10*3/uL (ref 4.0–10.5)

## 2023-01-12 LAB — BASIC METABOLIC PANEL
BUN: 11 mg/dL (ref 6–23)
CO2: 29 meq/L (ref 19–32)
Calcium: 10.8 mg/dL — ABNORMAL HIGH (ref 8.4–10.5)
Chloride: 96 meq/L (ref 96–112)
Creatinine, Ser: 0.95 mg/dL (ref 0.40–1.20)
GFR: 70.77 mL/min (ref >60.00)
Glucose, Bld: 117 mg/dL — ABNORMAL HIGH (ref 70–99)
Potassium: 3.7 meq/L (ref 3.5–5.1)
Sodium: 136 meq/L (ref 135–145)

## 2023-01-12 LAB — TROPONIN I (HIGH SENSITIVITY): High Sens Troponin I: 6 ng/L (ref 2–17)

## 2023-01-12 LAB — BRAIN NATRIURETIC PEPTIDE: Pro B Natriuretic peptide (BNP): 9 pg/mL (ref 0.0–100.0)

## 2023-01-12 LAB — HEMOGLOBIN A1C: Hgb A1c MFr Bld: 6.2 % (ref 4.6–6.5)

## 2023-01-12 NOTE — Progress Notes (Signed)
Subjective:  Patient ID: Stacey Ruiz, female    DOB: Sep 25, 1975  Age: 47 y.o. MRN: [id number]  CC: Hypertension   HPI Stacey Ruiz presents for f/up ----  Discussed the use of AI scribe software for clinical note transcription with the patient, who gave verbal consent to proceed.  History of Present Illness   The patient with hypertension, reported experiencing a headache over the last 3 days, which they attributed to high blood pressure. They noted that they had been extremely busy and had neglected their self-care. They denied taking any medications that could potentially raise their blood pressure, such as decongestants or anti-inflammatories. according to RX refills she may not be taking any antihypertensives  The patient also reported experiencing shortness of breath when active, particularly when climbing stairs. They noted that they had been carrying a child and other items at the time but found themselves winded after the activity. This was a new symptom for them, and they expressed concern due to their father's recent hospitalization for blood clots in his lungs, which had presented with shortness of breath.  The patient also mentioned experiencing some chest pain, which they had initially attributed to gas. The pain was not constant and had since resolved. They denied any leg or foot swelling.       Outpatient Medications Prior to Visit  Medication Sig Dispense Refill   BLACK COHOSH PO Take by mouth daily. Take one     Cholecalciferol (VITAMIN D3 PO) Take by mouth daily. Take 5000 iu     Multiple Vitamin (MULTIVITAMIN PO) Take by mouth daily. [place] own women take one daily     Multiple Vitamins-Minerals (ZINC PO) Take 50 mg by mouth. Take one pill daily     OVER THE COUNTER MEDICATION 500 mg daily. [place] tail mushroom vitamin-take one daily     OVER THE COUNTER MEDICATION Oil of oregano drops-take first 5 days of the month     Ascorbic Acid (VITAMIN C PO) Take by  mouth daily. Take one chewable tablet     ibuprofen (ADVIL) 600 MG tablet TAKE 1 TABLET(600 MG) BY MOUTH EVERY 8 HOURS AS NEEDED 90 tablet 1   triamterene-hydrochlorothiazide (DYAZIDE) 37.5-25 MG capsule Take 1 each (1 capsule total) by mouth daily. 90 capsule 1   No facility-administered medications prior to visit.    ROS Review of Systems  Constitutional:  Negative for chills, diaphoresis, fatigue and fever.  HENT: Negative.    Respiratory:  Positive for shortness of breath (DOE). Negative for apnea, choking and wheezing.   Cardiovascular:  Negative for chest pain, palpitations and leg swelling.  Gastrointestinal: Negative.  Negative for abdominal pain, constipation, diarrhea, nausea and vomiting.  Genitourinary:  Negative for difficulty urinating.  Musculoskeletal: Negative.  Negative for arthralgias, joint swelling and myalgias.  Skin: Negative.   Neurological:  Positive for headaches. Negative for dizziness and weakness.  Hematological:  Negative for adenopathy. Does not bruise/bleed easily.  Psychiatric/Behavioral: Negative.      Objective:  BP (!) 164/96 (BP Location: Right Arm, Patient Position: Sitting, Cuff Size: Large)   Pulse 71   Temp 98.2 F (36.8 C) (Oral)   Resp 16   Ht 5\' 5"  (1.651 m)   Wt 195 lb (88.5 kg)   SpO2 99%   BMI 32.45 kg/m   BP Readings from Last 3 Encounters:  01/12/23 (!) 164/96  06/15/22 107/73  06/09/22 120/82    Wt Readings from Last 3 Encounters:  01/12/23 195 lb (88.5  kg)  06/15/22 193 lb (87.5 kg)  06/09/22 194 lb (88 kg)    Physical Exam Vitals reviewed.  Constitutional:      Appearance: Normal appearance. She is not ill-appearing.  HENT:     Mouth/Throat:     Mouth: Mucous membranes are moist.  Eyes:     General: No scleral icterus.    Conjunctiva/sclera: Conjunctivae normal.  Cardiovascular:     Rate and Rhythm: Normal rate and regular rhythm.     Heart sounds: No murmur heard.    No friction rub. No gallop.      Comments: EKG- NSR, 71 bpm LAD Minimal LVH No Q waves or acute ST/T wave changes Pulmonary:     Effort: Pulmonary effort is normal.     Breath sounds: No stridor. No wheezing, rhonchi or rales.  Abdominal:     General: Abdomen is flat.     Palpations: There is no mass.     Tenderness: There is no abdominal tenderness. There is no guarding.     Hernia: No hernia is present.  Musculoskeletal:        General: Normal range of motion.     Cervical back: Neck supple.     Right lower leg: No edema.     Left lower leg: No edema.  Lymphadenopathy:     Cervical: No cervical adenopathy.  Skin:    General: Skin is dry.  Neurological:     General: No focal deficit present.     Mental Status: She is alert. Mental status is at baseline.  Psychiatric:        Mood and Affect: Mood normal.        Behavior: Behavior normal.     Lab Results  Component Value Date   WBC 5.3 01/12/2023   HGB 15.3 (H) 01/12/2023   HCT 45.8 01/12/2023   PLT 307.0 01/12/2023   GLUCOSE 117 (H) 01/12/2023   CHOL 197 05/06/2022   TRIG 142.0 05/06/2022   HDL 47.60 05/06/2022   LDLCALC 121 (H) 05/06/2022   ALT 18 05/06/2022   AST 20 05/06/2022   NA 136 01/12/2023   K 3.7 01/12/2023   CL 96 01/12/2023   CREATININE 0.95 01/12/2023   BUN 11 01/12/2023   CO2 29 01/12/2023   TSH 1.12 05/06/2022   INR 0.9 05/20/2011   HGBA1C 6.2 01/12/2023    No results found.  Assessment & Plan:   Essential hypertension, benign - Her BP is high and she is symptomatic. Will try to get better control of her BP. -     Basic metabolic panel; Future -     CBC with Differential/Platelet; Future -     EKG 12-Lead -     Triamterene-HCTZ; Take 1 each (1 capsule total) by mouth daily.  Dispense: 90 capsule; Refill: 0 -     amLODIPine Besylate; Take 1 tablet (5 mg total) by mouth daily.  Dispense: 90 tablet; Refill: 0  Prediabetes -     Basic metabolic panel; Future -     Hemoglobin A1c; Future  DOE (dyspnea on exertion)- EKG  and labs are reassuring. -     Troponin I (High Sensitivity); Future -     Brain natriuretic peptide; Future -     EKG 12-Lead  Hypercalcemia -     Ambulatory referral to Endocrinology  Chronic hypokalemia -     Triamterene-HCTZ; Take 1 each (1 capsule total) by mouth daily.  Dispense: 90 capsule; Refill: 0  Follow-up: Return in about 3 months (around 04/14/2023).  Sanda Linger, MD

## 2023-01-12 NOTE — Patient Instructions (Signed)
Hypertension, Adult High blood pressure (hypertension) is when the force of blood pumping through the arteries is too strong. The arteries are the blood vessels that carry blood from the heart throughout the body. Hypertension forces the heart to work harder to pump blood and may cause arteries to become narrow or stiff. Untreated or uncontrolled hypertension can lead to a heart attack, heart failure, a stroke, kidney disease, and other problems. A blood pressure reading consists of a higher number over a lower number. Ideally, your blood pressure should be below 120/80. The first ("top") number is called the systolic pressure. It is a measure of the pressure in your arteries as your heart beats. The second ("bottom") number is called the diastolic pressure. It is a measure of the pressure in your arteries as the heart relaxes. What are the causes? The exact cause of this condition is not known. There are some conditions that result in high blood pressure. What increases the risk? Certain factors may make you more likely to develop high blood pressure. Some of these risk factors are under your control, including: Smoking. Not getting enough exercise or physical activity. Being overweight. Having too much fat, sugar, calories, or salt (sodium) in your diet. Drinking too much alcohol. Other risk factors include: Having a personal history of heart disease, diabetes, high cholesterol, or kidney disease. Stress. Having a family history of high blood pressure and high cholesterol. Having obstructive sleep apnea. Age. The risk increases with age. What are the signs or symptoms? High blood pressure may not cause symptoms. Very high blood pressure (hypertensive crisis) may cause: Headache. Fast or irregular heartbeats (palpitations). Shortness of breath. Nosebleed. Nausea and vomiting. Vision changes. Severe chest pain, dizziness, and seizures. How is this diagnosed? This condition is diagnosed by  measuring your blood pressure while you are seated, with your arm resting on a flat surface, your legs uncrossed, and your feet flat on the floor. The cuff of the blood pressure monitor will be placed directly against the skin of your upper arm at the level of your heart. Blood pressure should be measured at least twice using the same arm. Certain conditions can cause a difference in blood pressure between your right and left arms. If you have a high blood pressure reading during one visit or you have normal blood pressure with other risk factors, you may be asked to: Return on a different day to have your blood pressure checked again. Monitor your blood pressure at home for 1 week or longer. If you are diagnosed with hypertension, you may have other blood or imaging tests to help your health care provider understand your overall risk for other conditions. How is this treated? This condition is treated by making healthy lifestyle changes, such as eating healthy foods, exercising more, and reducing your alcohol intake. You may be referred for counseling on a healthy diet and physical activity. Your health care provider may prescribe medicine if lifestyle changes are not enough to get your blood pressure under control and if: Your systolic blood pressure is above 130. Your diastolic blood pressure is above 80. Your personal target blood pressure may vary depending on your medical conditions, your age, and other factors. Follow these instructions at home: Eating and drinking  Eat a diet that is high in fiber and potassium, and low in sodium, added sugar, and fat. An example of this eating plan is called the DASH diet. DASH stands for Dietary Approaches to Stop Hypertension. To eat this way: Eat   plenty of fresh fruits and vegetables. Try to fill one half of your plate at each meal with fruits and vegetables. Eat whole grains, such as whole-wheat pasta, brown rice, or whole-grain bread. Fill about one  fourth of your plate with whole grains. Eat or drink low-fat dairy products, such as skim milk or low-fat yogurt. Avoid fatty cuts of meat, processed or cured meats, and poultry with skin. Fill about one fourth of your plate with lean proteins, such as fish, chicken without skin, beans, eggs, or tofu. Avoid pre-made and processed foods. These tend to be higher in sodium, added sugar, and fat. Reduce your daily sodium intake. Many people with hypertension should eat less than 1,500 mg of sodium a day. Do not drink alcohol if: Your health care provider tells you not to drink. You are pregnant, may be pregnant, or are planning to become pregnant. If you drink alcohol: Limit how much you have to: 0-1 drink a day for women. 0-2 drinks a day for men. Know how much alcohol is in your drink. In the [place] one drink equals one 12 oz bottle of beer (355 mL), one 5 oz glass of wine (148 mL), or one 1 oz glass of hard liquor (44 mL). Lifestyle  Work with your health care provider to maintain a healthy body weight or to lose weight. Ask what an ideal weight is for you. Get at least 30 minutes of exercise that causes your heart to beat faster (aerobic exercise) most days of the week. Activities may include walking, swimming, or biking. Include exercise to strengthen your muscles (resistance exercise), such as Pilates or lifting weights, as part of your weekly exercise routine. Try to do these types of exercises for 30 minutes at least 3 days a week. Do not use any products that contain nicotine or tobacco. These products include cigarettes, chewing tobacco, and vaping devices, such as e-cigarettes. If you need help quitting, ask your health care provider. Monitor your blood pressure at home as told by your health care provider. Keep all follow-up visits. This is important. Medicines Take over-the-counter and prescription medicines only as told by your health care provider. Follow directions carefully. Blood  pressure medicines must be taken as prescribed. Do not skip doses of blood pressure medicine. Doing this puts you at risk for problems and can make the medicine less effective. Ask your health care provider about side effects or reactions to medicines that you should watch for. Contact a health care provider if you: Think you are having a reaction to a medicine you are taking. Have headaches that keep coming back (recurring). Feel dizzy. Have swelling in your ankles. Have trouble with your vision. Get help right away if you: Develop a severe headache or confusion. Have unusual weakness or numbness. Feel faint. Have severe pain in your chest or abdomen. Vomit repeatedly. Have trouble breathing. These symptoms may be an emergency. Get help right away. Call 911. Do not wait to see if the symptoms will go away. Do not drive yourself to the hospital. Summary Hypertension is when the force of blood pumping through your arteries is too strong. If this condition is not controlled, it may put you at risk for serious complications. Your personal target blood pressure may vary depending on your medical conditions, your age, and other factors. For most people, a normal blood pressure is less than 120/80. Hypertension is treated with lifestyle changes, medicines, or a combination of both. Lifestyle changes include losing weight, eating a healthy,   low-sodium diet, exercising more, and limiting alcohol. This information is not intended to replace advice given to you by your health care provider. Make sure you discuss any questions you have with your health care provider. Document Revised: 01/28/2021 Document Reviewed: 01/28/2021 Elsevier Patient Education  2024 [employer]  

## 2023-01-13 ENCOUNTER — Encounter: Payer: Self-pay | Admitting: Internal Medicine

## 2023-01-13 ENCOUNTER — Other Ambulatory Visit: Payer: Self-pay | Admitting: Internal Medicine

## 2023-01-13 DIAGNOSIS — Z1231 Encounter for screening mammogram for malignant neoplasm of breast: Secondary | ICD-10-CM

## 2023-01-13 MED ORDER — AMLODIPINE BESYLATE 5 MG PO TABS
5.0000 mg | ORAL_TABLET | Freq: Every day | ORAL | 0 refills | Status: DC
Start: 2023-01-13 — End: 2023-08-23

## 2023-01-13 MED ORDER — TRIAMTERENE-HCTZ 37.5-25 MG PO CAPS: 1.0000 | ORAL_CAPSULE | Freq: Every day | ORAL | 0 refills | Status: DC

## 2023-01-18 ENCOUNTER — Ambulatory Visit
Admission: RE | Admit: 2023-01-18 | Discharge: 2023-01-18 | Disposition: A | Discharge status: Home / Self Care | Payer: BC Managed Care – PPO | Source: Ambulatory Visit | Attending: Internal Medicine | Admitting: Internal Medicine

## 2023-01-18 DIAGNOSIS — Z1231 Encounter for screening mammogram for malignant neoplasm of breast: Secondary | ICD-10-CM | POA: Diagnosis not present

## 2023-02-24 DIAGNOSIS — R7303 Prediabetes: Secondary | ICD-10-CM | POA: Diagnosis not present

## 2023-02-24 DIAGNOSIS — E559 Vitamin D deficiency, unspecified: Secondary | ICD-10-CM | POA: Diagnosis not present

## 2023-02-24 DIAGNOSIS — I10 Essential (primary) hypertension: Secondary | ICD-10-CM | POA: Diagnosis not present

## 2023-03-10 DIAGNOSIS — I10 Essential (primary) hypertension: Secondary | ICD-10-CM | POA: Diagnosis not present

## 2023-03-10 DIAGNOSIS — R7303 Prediabetes: Secondary | ICD-10-CM | POA: Diagnosis not present

## 2023-03-11 ENCOUNTER — Ambulatory Visit: Payer: BC Managed Care – PPO | Admitting: Emergency Medicine

## 2023-03-12 ENCOUNTER — Encounter: Payer: Self-pay | Admitting: Family Medicine

## 2023-03-12 ENCOUNTER — Ambulatory Visit: Payer: BC Managed Care – PPO | Admitting: Family Medicine

## 2023-03-12 VITALS — BP 124/82 | HR 107 | Temp 98.5°F | Ht 65.0 in | Wt 194.0 lb

## 2023-03-12 DIAGNOSIS — J029 Acute pharyngitis, unspecified: Secondary | ICD-10-CM | POA: Diagnosis not present

## 2023-03-12 DIAGNOSIS — H66001 Acute suppurative otitis media without spontaneous rupture of ear drum, right ear: Secondary | ICD-10-CM | POA: Diagnosis not present

## 2023-03-12 MED ORDER — AMOXICILLIN-POT CLAVULANATE 875-125 MG PO TABS
1.0000 | ORAL_TABLET | Freq: Two times a day (BID) | ORAL | 0 refills | Status: AC
Start: 2023-03-12 — End: 2023-03-22

## 2023-03-12 MED ORDER — FLUCONAZOLE 150 MG PO TABS: ORAL_TABLET | ORAL | 0 refills | Status: DC

## 2023-03-12 NOTE — Patient Instructions (Signed)
I have sent in Augmentin for you to take twice a day for 10 days.  This medication can upset your stomach, so I tell everyone to take it with a meal.  Follow-up with me for new or worsening symptoms.

## 2023-03-12 NOTE — Progress Notes (Signed)
Acute Office Visit  Subjective:     Patient ID: Stacey Ruiz, female    DOB: 08/21/1975, 47 y.o.   MRN: [id number]  Chief Complaint  Patient presents with   Sore Throat    Patient states having a tickle in her throat and had this a little before thanksgivings. She has coughs when she lays down. Sore throat is worse at night but is okay during the day. States her face was pounding right before thanksgiving hasn't happened again.     Sore Throat    47 year old female presents for evaluation of sore throat, fatigue, right ear pain for the last 8 days.  States that children and grandchildren are with her most of the time. Has been taking ibuprofen with some relief. Denies cough, shortness of breath, discharge from the ears, headaches, abdominal pain, nausea, vomiting, diarrhea, rash, fever, chills, other symptoms. Medical history as outlined below.  ROS Per HPI      Objective:    BP 124/82 (BP Location: Left Arm, Patient Position: Sitting, Cuff Size: Normal)   Pulse (!) 107   Temp 98.5 F (36.9 C) (Oral)   Ht 5\' 5"  (1.651 m)   Wt 194 lb (88 kg)   SpO2 96%   BMI 32.28 kg/m    Physical Exam Vitals and nursing note reviewed.  Constitutional:      Appearance: Normal appearance. She is normal weight.     Comments: Appears fatigued  HENT:     Head: Normocephalic and atraumatic.     Right Ear: Swelling present. No drainage. A middle ear effusion is present. Tympanic membrane is erythematous.     Left Ear: Tympanic membrane and ear canal normal. No drainage, swelling or tenderness.  No middle ear effusion. Tympanic membrane is not erythematous.     Nose: Nose normal.     Mouth/Throat:     Mouth: Mucous membranes are moist.     Pharynx: Oropharynx is clear.     Tonsils: No tonsillar exudate or tonsillar abscesses.     Comments: Cobblestoning present Eyes:     Extraocular Movements: Extraocular movements intact.     Pupils: Pupils are equal, round, and reactive to  light.  Cardiovascular:     Rate and Rhythm: Regular rhythm. Tachycardia present.     Heart sounds: Normal heart sounds.  Pulmonary:     Effort: Pulmonary effort is normal. No respiratory distress.     Breath sounds: Normal breath sounds. No wheezing, rhonchi or rales.  Musculoskeletal:        General: Normal range of motion.     Cervical back: Normal range of motion.  Neurological:     General: No focal deficit present.     Mental Status: She is alert and oriented to person, place, and time.  Psychiatric:        Mood and Affect: Mood normal.        Thought Content: Thought content normal.     No results found for any visits on 03/12/23.      Assessment & Plan:  1. Sore throat  -Discussed that most likely humidifier in your room, this is likely coming from sinus drainage -Discussed drinking plenty of fluids and getting rest  2. Non-recurrent acute suppurative otitis media of right ear without spontaneous rupture of tympanic membrane  - amoxicillin-clavulanate (AUGMENTIN) 875-125 MG tablet; Take 1 tablet by mouth 2 (two) times daily for 10 days.  Dispense: 20 tablet; Refill: 0 - fluconazole (DIFLUCAN) 150 MG tablet; Take  one tablet at the onset of symptoms. If symptoms are still present 3 days later, take the second tablet.  Dispense: 2 tablet; Refill: 0    Meds ordered this encounter  Medications   amoxicillin-clavulanate (AUGMENTIN) 875-125 MG tablet    Sig: Take 1 tablet by mouth 2 (two) times daily for 10 days.    Dispense:  20 tablet    Refill:  0   fluconazole (DIFLUCAN) 150 MG tablet    Sig: Take one tablet at the onset of symptoms. If symptoms are still present 3 days later, take the second tablet.    Dispense:  2 tablet    Refill:  0    Return if symptoms worsen or fail to improve.  Moshe Cipro, FNP

## 2023-05-13 ENCOUNTER — Ambulatory Visit (INDEPENDENT_AMBULATORY_CARE_PROVIDER_SITE_OTHER): Payer: BC Managed Care – PPO

## 2023-05-13 ENCOUNTER — Encounter: Payer: Self-pay | Admitting: Internal Medicine

## 2023-05-13 ENCOUNTER — Ambulatory Visit: Payer: BC Managed Care – PPO | Admitting: Internal Medicine

## 2023-05-13 VITALS — BP 136/74 | HR 78 | Temp 98.6°F | Resp 18 | Ht 65.0 in | Wt 192.0 lb

## 2023-05-13 DIAGNOSIS — R051 Acute cough: Secondary | ICD-10-CM | POA: Diagnosis not present

## 2023-05-13 DIAGNOSIS — J069 Acute upper respiratory infection, unspecified: Secondary | ICD-10-CM

## 2023-05-13 DIAGNOSIS — I10 Essential (primary) hypertension: Secondary | ICD-10-CM

## 2023-05-13 DIAGNOSIS — M549 Dorsalgia, unspecified: Secondary | ICD-10-CM | POA: Diagnosis not present

## 2023-05-13 DIAGNOSIS — R059 Cough, unspecified: Secondary | ICD-10-CM | POA: Diagnosis not present

## 2023-05-13 LAB — POC COVID19 BINAXNOW: SARS Coronavirus 2 Ag: NEGATIVE

## 2023-05-13 LAB — POC INFLUENZA A&B (BINAX/QUICKVUE)
Influenza A, POC: NEGATIVE
Influenza B, POC: NEGATIVE

## 2023-05-13 NOTE — Addendum Note (Signed)
 Addended by: Katherene Pals on: 05/13/2023 04:26 PM   Modules accepted: Orders

## 2023-05-13 NOTE — Progress Notes (Signed)
 Subjective:    Patient ID: Stacey Ruiz, female    DOB: 03/01/1976, 48 y.o.   MRN: [id number]      HPI Stacey Ruiz is here for  Chief Complaint  Patient presents with   Cough    Cough and low back pain (started Friday); Grandson tested positive for COVID and he tested positive for flu; Cough is dry and non-productive; Hx of pneumonia and would like xray to make sure she does not have it again    She is here for an acute visit for cold symptoms.   Her symptoms started one week ago.  Her grandson was sick with covid and flu.  She is experiencing dec appetite, fatigue, nasal congestion, chest tightness, wheeze esp at night, cough - occ productive, SOB, nausea and headaches.  Not sure if she had a fever.  She has tried taking mucinex , tylenol  or advil       Medications and allergies reviewed with patient and updated if appropriate.  Current Outpatient Medications on File Prior to Visit  Medication Sig Dispense Refill   amLODipine  (NORVASC ) 5 MG tablet Take 1 tablet (5 mg total) by mouth daily. 90 tablet 0   BLACK COHOSH PO Take by mouth daily. Take one     Cholecalciferol  (VITAMIN D3 PO) Take by mouth daily. Take 5000 iu     Multiple Vitamin (MULTIVITAMIN PO) Take by mouth daily. [place] own women take one daily     Multiple Vitamins-Minerals (ZINC PO) Take 50 mg by mouth. Take one pill daily     OVER THE COUNTER MEDICATION 500 mg daily. [place] tail mushroom vitamin-take one daily     OVER THE COUNTER MEDICATION Oil of oregano drops-take first 5 days of the month     triamterene -hydrochlorothiazide (DYAZIDE) 37.5-25 MG capsule Take 1 each (1 capsule total) by mouth daily. 90 capsule 0   Ascorbic Acid 500 MG CAPS as directed Orally     fluconazole  (DIFLUCAN ) 150 MG tablet Take one tablet at the onset of symptoms. If symptoms are still present 3 days later, take the second tablet. (Patient not taking: Reported on 05/13/2023) 2 tablet 0   No current facility-administered  medications on file prior to visit.    Review of Systems  Constitutional:  Positive for appetite change (dec) and fatigue. Negative for fever.  HENT:  Positive for congestion (mild - developed later after cough). Negative for ear pain, sinus pressure (one night - resolved) and sore throat.   Respiratory:  Positive for cough (occ brings up phlegm), chest tightness, shortness of breath and wheezing (when she lays down - heard rattling last night).   Cardiovascular:  Negative for chest pain.  Gastrointestinal:  Positive for nausea. Negative for diarrhea and vomiting.  Musculoskeletal:  Positive for back pain (dull pain in lower back - 5/10) and myalgias (arms, legs - improved).  Neurological:  Positive for headaches. Negative for dizziness and light-headedness.       Objective:   Vitals:   05/13/23 0805  BP: 136/74  Pulse: 78  Resp: 18  Temp: 98.6 F (37 C)   BP Readings from Last 3 Encounters:  05/13/23 136/74  03/12/23 124/82  01/12/23 (!) 164/96   Wt Readings from Last 3 Encounters:  05/13/23 192 lb (87.1 kg)  03/12/23 194 lb (88 kg)  01/12/23 195 lb (88.5 kg)   Body mass index is 31.95 kg/m.    Physical Exam Constitutional:      General: She is not in acute distress.  Appearance: Normal appearance. She is not ill-appearing.  HENT:     Head: Normocephalic and atraumatic.     Right Ear: Tympanic membrane, ear canal and external ear normal.     Left Ear: Tympanic membrane, ear canal and external ear normal.     Mouth/Throat:     Mouth: Mucous membranes are moist.     Pharynx: No oropharyngeal exudate or posterior oropharyngeal erythema.  Eyes:     Conjunctiva/sclera: Conjunctivae normal.  Cardiovascular:     Rate and Rhythm: Normal rate and regular rhythm.  Pulmonary:     Effort: Pulmonary effort is normal. No respiratory distress.     Breath sounds: Normal breath sounds. No wheezing or rales.  Musculoskeletal:     Cervical back: Neck supple. No tenderness.   Lymphadenopathy:     Cervical: No cervical adenopathy.  Skin:    General: Skin is warm and dry.  Neurological:     Mental Status: She is alert.            Assessment & Plan:    URI, viral: Acute Symptoms likely viral in nature Rapid covid and flu tests are negative Continue symptomatic treatment with over-the-counter cold medications, Tylenol /ibuprofen  Increase rest and fluids Call if symptoms worsen or do not improve  Hypertension: Chronic BP well controlled Continue amlodipine  5 mg daily, Dyazide 37.5-25 mg - 1 cap daily

## 2023-05-13 NOTE — Patient Instructions (Addendum)
      Have a chest xray    Medications changes include :   None - continue otc cold medications     Return if symptoms worsen or fail to improve.

## 2023-05-24 DIAGNOSIS — R7303 Prediabetes: Secondary | ICD-10-CM | POA: Diagnosis not present

## 2023-06-04 ENCOUNTER — Other Ambulatory Visit: Payer: Self-pay | Admitting: Internal Medicine

## 2023-06-04 DIAGNOSIS — E876 Hypokalemia: Secondary | ICD-10-CM

## 2023-06-04 DIAGNOSIS — I10 Essential (primary) hypertension: Secondary | ICD-10-CM

## 2023-06-10 DIAGNOSIS — E1165 Type 2 diabetes mellitus with hyperglycemia: Secondary | ICD-10-CM | POA: Diagnosis not present

## 2023-06-10 DIAGNOSIS — I10 Essential (primary) hypertension: Secondary | ICD-10-CM | POA: Diagnosis not present

## 2023-06-10 DIAGNOSIS — E781 Pure hyperglyceridemia: Secondary | ICD-10-CM | POA: Diagnosis not present

## 2023-08-20 ENCOUNTER — Other Ambulatory Visit: Payer: Self-pay | Admitting: Internal Medicine

## 2023-08-20 DIAGNOSIS — I10 Essential (primary) hypertension: Secondary | ICD-10-CM

## 2023-08-23 ENCOUNTER — Telehealth: Payer: Self-pay

## 2023-08-23 ENCOUNTER — Ambulatory Visit: Admitting: Family Medicine

## 2023-08-23 ENCOUNTER — Ambulatory Visit: Payer: Self-pay

## 2023-08-23 ENCOUNTER — Encounter: Payer: Self-pay | Admitting: Family Medicine

## 2023-08-23 VITALS — BP 140/90 | HR 98 | Temp 98.2°F | Resp 18 | Ht 65.0 in | Wt 199.0 lb

## 2023-08-23 DIAGNOSIS — I10 Essential (primary) hypertension: Secondary | ICD-10-CM

## 2023-08-23 DIAGNOSIS — R1031 Right lower quadrant pain: Secondary | ICD-10-CM

## 2023-08-23 MED ORDER — AMLODIPINE BESYLATE 5 MG PO TABS: 5.0000 mg | ORAL_TABLET | Freq: Every day | ORAL | 0 refills | Status: DC

## 2023-08-23 NOTE — Telephone Encounter (Signed)
  Chief Complaint: abd pain Symptoms: intermittent pain, HA Frequency: 3 days Pertinent Negatives: Patient denies fever, change in bowel habits, GU s/s Disposition: [] ED /[] Urgent Care (no appt availability in office) / [x] Appointment(In office/virtual)/ []  [place] [facility]/ [] Home Care/ [] Refused Recommended Disposition /[]  Mobile Bus/ []  Follow-up with PCP Additional Notes: Pt states that she woke in the middle of the night 3 nights ago with stomach pain that traveled into her chest. Pt states the pain is intermittent in her abd currently. Pt states that her BM's have been normal for her to this point. States she is nauseous but no vomiting. Pt stats that she is menopausal, denies fever.  Pt still has GB and appendix. Pt scheduled for today.   Copied from CRM [id number]. Topic: Clinical - Red Word Triage >> Aug 23, 2023  9:49 AM Corin V wrote: [facility] that prompted transfer to Nurse Triage: Patient is having excruciating stomach pain to the point of nausea but no throwing up. Pain has been going on for 3 days. She is getting a headache and any pressure will cause pain. The pain moves across her stomach. Pain is 7-8/10. It is not constant and it comes and goes. It is like a cramp. Reason for Disposition  [1] MODERATE pain (e.g., interferes with normal activities) AND [2] pain comes and goes (cramps) AND [3] present > 24 hours  (Exception: Pain with Vomiting or Diarrhea - see that Guideline.)  Answer Assessment - Initial Assessment Questions 1. LOCATION: "Where does it hurt?"      abd 2. RADIATION: "Does the pain shoot anywhere else?" (e.g., chest, back)     Throughout stomach 3. ONSET: "When did the pain begin?" (e.g., minutes, hours or days ago)      3 days ago 4. SUDDEN: "Gradual or sudden onset?"     sudden 5. PATTERN "Does the pain come and go, or is it constant?"    - If it comes and goes: "How long does it last?" "Do you have pain now?"     (Note: Comes and goes means  the pain is intermittent. It goes away completely between bouts.)    - If constant: "Is it getting better, staying the same, or getting worse?"      (Note: Constant means the pain never goes away completely; most serious pain is constant and gets worse.)      intermittent 6. SEVERITY: "How bad is the pain?"  (e.g., Scale 1-10; mild, moderate, or severe)    - MILD (1-3): Doesn't interfere with normal activities, abdomen soft and not tender to touch.     - MODERATE (4-7): Interferes with normal activities or awakens from sleep, abdomen tender to touch.     - SEVERE (8-10): Excruciating pain, doubled over, unable to do any normal activities.       7-8 7. RECURRENT SYMPTOM: "Have you ever had this type of stomach pain before?" If Yes, ask: "When was the last time?" and "What happened that time?"      denies 8. CAUSE: "What do you think is causing the stomach pain?"     unsure 9. RELIEVING/AGGRAVATING FACTORS: "What makes it better or worse?" (e.g., antacids, bending or twisting motion, bowel movement)     denies 10. OTHER SYMPTOMS: "Do you have any other symptoms?" (e.g., back pain, diarrhea, fever, urination pain, vomiting)       HA  Protocols used: Abdominal Pain - Female-A-AH

## 2023-08-23 NOTE — Telephone Encounter (Signed)
 Copied from CRM [id number]. Topic: Clinical - Medication Prior Auth >> Aug 23, 2023  3:56 PM Lenon Radar A wrote: Reason for CRM: [facility] called in to see if patients CT requires PA. Please contact [id number] [phone].

## 2023-08-23 NOTE — Progress Notes (Signed)
 Assessment & Plan:  1. Right lower quadrant abdominal pain (Primary) STAT CT of the abdomen ordered to rule out appendicitis.  Patient is unable to have this completed this evening due to timing.  Advised if pain worsens overnight to go to emergency room. - CT ABDOMEN PELVIS WO CONTRAST; Future  2. Essential hypertension, benign Uncontrolled.  Refilled amlodipine  5 mg once daily.  Encouraged patient to monitor her blood pressure at home and keep a log to bring with her to her follow-up appointment with her PCP. - amLODipine  (NORVASC ) 5 MG tablet; Take 1 tablet (5 mg total) by mouth daily.  Dispense: 30 tablet; Refill: 0   Follow up plan: Return for chronic follow-up with PCP (patient is pastdue).  Hershel Los, MSN, APRN, FNP-C  Subjective:  HPI: Stacey Ruiz is a 48 y.o. female presenting on 08/23/2023 for Abdominal Pain (Started Friday night - woke with some chest discomfort: pain the takes breath away, painful touch - general pain around abdomen, cramps at times. No fevers, some nausea. No diarrhea no vomiting ) and Medication Refill (Amlodipine  - out current for a few weeks )  Patient reports Friday night she woke with chest discomfort in the middle of her chest, she moved and burped and the pain subsided.  Saturday she started experiencing abdominal pain.  She states the pain is so bad it takes her breath away at times and it hurts to walk.  She has regular/loose bowel movements every day; this has not changed.  She is experiencing nausea but no vomiting.  She has been out of her amlodipine  for a few weeks and is in need of a refill.    ROS: Negative unless specifically indicated above in HPI.   Relevant past medical history reviewed and updated as indicated.   Allergies and medications reviewed and updated.   Current Outpatient Medications:    amLODipine  (NORVASC ) 5 MG tablet, TAKE 1 TABLET(5 MG) BY MOUTH DAILY. Needs appointment with PCP for refills., Disp: 30 tablet,  Rfl: 0   Ascorbic Acid 500 MG CAPS, as directed Orally, Disp: , Rfl:    Cholecalciferol  (VITAMIN D3 PO), Take by mouth daily. Take 5000 iu, Disp: , Rfl:    Multiple Vitamin (MULTIVITAMIN PO), Take by mouth daily. Lonne Roan own women take one daily, Disp: , Rfl:    Multiple Vitamins-Minerals (ZINC PO), Take 50 mg by mouth. Take one pill daily, Disp: , Rfl:    OVER THE COUNTER MEDICATION, 500 mg daily. [place] tail mushroom vitamin-take one daily, Disp: , Rfl:    OVER THE COUNTER MEDICATION, Oil of oregano drops-take first 5 days of the month, Disp: , Rfl:    triamterene -hydrochlorothiazide (DYAZIDE) 37.5-25 MG capsule, TAKE 1 CAPSULE BY MOUTH EVERY DAY, Disp: 90 capsule, Rfl: 0   BLACK COHOSH PO, Take by mouth daily. Take one (Patient not taking: Reported on 08/23/2023), Disp: , Rfl:   Allergies  Allergen Reactions   Hydrocodone  Itching and Nausea And Vomiting   Oxycodone  Itching and Nausea And Vomiting    Objective:   BP (!) 140/90   Pulse 98   Temp 98.2 F (36.8 C)   Resp 18   Ht 5\' 5"  (1.651 m)   Wt 199 lb (90.3 kg)   LMP 08/22/2020 (Approximate)   SpO2 99%   BMI 33.12 kg/m    Physical Exam Vitals reviewed.  Constitutional:      General: She is not in acute distress.    Appearance: Normal appearance. She is not ill-appearing,  toxic-appearing or diaphoretic.  HENT:     Head: Normocephalic and atraumatic.  Eyes:     General: No scleral icterus.       Right eye: No discharge.        Left eye: No discharge.     Conjunctiva/sclera: Conjunctivae normal.  Cardiovascular:     Rate and Rhythm: Normal rate and regular rhythm.     Heart sounds: Normal heart sounds. No murmur heard.    No friction rub. No gallop.  Pulmonary:     Effort: Pulmonary effort is normal. No respiratory distress.     Breath sounds: Normal breath sounds. No stridor. No wheezing, rhonchi or rales.  Abdominal:     General: Bowel sounds are normal.     Palpations: Abdomen is soft. There is no mass or  pulsatile mass.     Tenderness: There is abdominal tenderness in the right lower quadrant.  Musculoskeletal:        General: Normal range of motion.     Cervical back: Normal range of motion.  Skin:    General: Skin is warm and dry.     Capillary Refill: Capillary refill takes less than 2 seconds.  Neurological:     General: No focal deficit present.     Mental Status: She is alert and oriented to person, place, and time. Mental status is at baseline.  Psychiatric:        Mood and Affect: Mood normal.        Behavior: Behavior normal.        Thought Content: Thought content normal.        Judgment: Judgment normal.

## 2023-08-24 ENCOUNTER — Emergency Department (HOSPITAL_BASED_OUTPATIENT_CLINIC_OR_DEPARTMENT_OTHER)

## 2023-08-24 ENCOUNTER — Emergency Department (HOSPITAL_BASED_OUTPATIENT_CLINIC_OR_DEPARTMENT_OTHER)
Admission: EM | Admit: 2023-08-24 | Discharge: 2023-08-24 | Disposition: A | Discharge status: Home / Self Care | Attending: Emergency Medicine | Admitting: Emergency Medicine

## 2023-08-24 ENCOUNTER — Ambulatory Visit: Payer: Self-pay

## 2023-08-24 ENCOUNTER — Other Ambulatory Visit: Payer: Self-pay

## 2023-08-24 ENCOUNTER — Encounter (HOSPITAL_BASED_OUTPATIENT_CLINIC_OR_DEPARTMENT_OTHER): Payer: Self-pay | Admitting: Emergency Medicine

## 2023-08-24 DIAGNOSIS — R112 Nausea with vomiting, unspecified: Secondary | ICD-10-CM

## 2023-08-24 DIAGNOSIS — R1031 Right lower quadrant pain: Secondary | ICD-10-CM | POA: Diagnosis not present

## 2023-08-24 DIAGNOSIS — K5732 Diverticulitis of large intestine without perforation or abscess without bleeding: Secondary | ICD-10-CM | POA: Diagnosis not present

## 2023-08-24 DIAGNOSIS — R000 Tachycardia, unspecified: Secondary | ICD-10-CM | POA: Diagnosis not present

## 2023-08-24 DIAGNOSIS — I10 Essential (primary) hypertension: Secondary | ICD-10-CM | POA: Insufficient documentation

## 2023-08-24 DIAGNOSIS — E860 Dehydration: Secondary | ICD-10-CM

## 2023-08-24 DIAGNOSIS — D72829 Elevated white blood cell count, unspecified: Secondary | ICD-10-CM | POA: Insufficient documentation

## 2023-08-24 DIAGNOSIS — R109 Unspecified abdominal pain: Secondary | ICD-10-CM | POA: Diagnosis not present

## 2023-08-24 DIAGNOSIS — K439 Ventral hernia without obstruction or gangrene: Secondary | ICD-10-CM | POA: Diagnosis not present

## 2023-08-24 DIAGNOSIS — Z79899 Other long term (current) drug therapy: Secondary | ICD-10-CM | POA: Diagnosis not present

## 2023-08-24 DIAGNOSIS — K5792 Diverticulitis of intestine, part unspecified, without perforation or abscess without bleeding: Secondary | ICD-10-CM | POA: Diagnosis not present

## 2023-08-24 LAB — URINALYSIS, ROUTINE W REFLEX MICROSCOPIC
Bacteria, UA: NONE SEEN
Bilirubin Urine: NEGATIVE
Glucose, UA: NEGATIVE mg/dL
Ketones, ur: 40 mg/dL — AB
Leukocytes,Ua: NEGATIVE
Nitrite: NEGATIVE
Specific Gravity, Urine: >1.046 — ABNORMAL HIGH (ref 1.005–1.030)
pH: 6 (ref 5.0–8.0)

## 2023-08-24 LAB — COMPREHENSIVE METABOLIC PANEL WITH GFR
ALT: 14 U/L (ref 0–44)
AST: 17 U/L (ref 15–41)
Albumin: 4.5 g/dL (ref 3.5–5.0)
Alkaline Phosphatase: 85 U/L (ref 38–126)
Anion gap: 16 — ABNORMAL HIGH (ref 5–15)
BUN: 9 mg/dL (ref 6–20)
CO2: 28 mmol/L (ref 22–32)
Calcium: 10.3 mg/dL (ref 8.9–10.3)
Chloride: 93 mmol/L — ABNORMAL LOW (ref 98–111)
Creatinine, Ser: 0.99 mg/dL (ref 0.44–1.00)
GFR, Estimated: >60 mL/min (ref >60)
Glucose, Bld: 139 mg/dL — ABNORMAL HIGH (ref 70–99)
Potassium: 3.2 mmol/L — ABNORMAL LOW (ref 3.5–5.1)
Sodium: 137 mmol/L (ref 135–145)
Total Bilirubin: 1.9 mg/dL — ABNORMAL HIGH (ref 0.0–1.2)
Total Protein: 8.3 g/dL — ABNORMAL HIGH (ref 6.5–8.1)

## 2023-08-24 LAB — CBC WITH DIFFERENTIAL/PLATELET
Abs Immature Granulocytes: 0.05 10*3/uL (ref 0.00–0.07)
Basophils Absolute: 0 10*3/uL (ref 0.0–0.1)
Basophils Relative: 0 %
Eosinophils Absolute: 0 10*3/uL (ref 0.0–0.5)
Eosinophils Relative: 0 %
HCT: 42.3 % (ref 36.0–46.0)
Hemoglobin: 14.6 g/dL (ref 12.0–15.0)
Immature Granulocytes: 0 %
Lymphocytes Relative: 14 %
Lymphs Abs: 1.8 10*3/uL (ref 0.7–4.0)
MCH: 29.2 pg (ref 26.0–34.0)
MCHC: 34.5 g/dL (ref 30.0–36.0)
MCV: 84.6 fL (ref 80.0–100.0)
Monocytes Absolute: 1.1 10*3/uL — ABNORMAL HIGH (ref 0.1–1.0)
Monocytes Relative: 9 %
Neutro Abs: 9.6 10*3/uL — ABNORMAL HIGH (ref 1.7–7.7)
Neutrophils Relative %: 77 %
Platelets: 336 10*3/uL (ref 150–400)
RBC: 5 MIL/uL (ref 3.87–5.11)
RDW: 12.3 % (ref 11.5–15.5)
WBC: 12.5 10*3/uL — ABNORMAL HIGH (ref 4.0–10.5)
nRBC: 0 % (ref 0.0–0.2)

## 2023-08-24 LAB — CBG MONITORING, ED: Glucose-Capillary: 150 mg/dL — ABNORMAL HIGH (ref 70–99)

## 2023-08-24 LAB — LIPASE, BLOOD: Lipase: 19 U/L (ref 11–51)

## 2023-08-24 MED ORDER — ONDANSETRON 4 MG PO TBDP: 4.0000 mg | ORAL_TABLET | Freq: Three times a day (TID) | ORAL | 0 refills | Status: DC | PRN

## 2023-08-24 MED ORDER — MORPHINE SULFATE (PF) 4 MG/ML IV SOLN
4.0000 mg | Freq: Once | INTRAVENOUS | Status: AC
  Administered 2023-08-24: 4 mg via INTRAVENOUS
  Filled 2023-08-24: qty 1

## 2023-08-24 MED ORDER — ONDANSETRON HCL 4 MG/2ML IJ SOLN
4.0000 mg | Freq: Once | INTRAMUSCULAR | Status: AC
  Administered 2023-08-24: 4 mg via INTRAVENOUS
  Filled 2023-08-24: qty 2

## 2023-08-24 MED ORDER — TRAMADOL HCL 50 MG PO TABS
50.0000 mg | ORAL_TABLET | Freq: Four times a day (QID) | ORAL | 0 refills | Status: DC | PRN
Start: 2023-08-24 — End: 2023-10-28

## 2023-08-24 MED ORDER — IOHEXOL 300 MG/ML  SOLN
100.0000 mL | Freq: Once | INTRAMUSCULAR | Status: AC | PRN
  Administered 2023-08-24: 100 mL via INTRAVENOUS

## 2023-08-24 MED ORDER — SODIUM CHLORIDE 0.9 % IV BOLUS
1000.0000 mL | Freq: Once | INTRAVENOUS | Status: AC
  Administered 2023-08-24: 1000 mL via INTRAVENOUS

## 2023-08-24 MED ORDER — AMOXICILLIN-POT CLAVULANATE 875-125 MG PO TABS: 1.0000 | ORAL_TABLET | Freq: Two times a day (BID) | ORAL | 0 refills | Status: DC

## 2023-08-24 NOTE — ED Provider Notes (Signed)
 [place] EMERGENCY DEPARTMENT AT [facility] Provider Note   CSN: [id number] Arrival date & time: 08/24/23  4098     History  Chief Complaint  Patient presents with   Abdominal Pain    Stacey Ruiz is a 48 y.o. female.  Pt is a 48 yo female with pmhx significant for migraines, anxiety, depression, htn, anemia, and arthritis.  Pt developed abd pain with n/v on 5/16.  She went to her pcp yesterday and a CT was ordered, but there was an issue with her insurance.  Pt said her pain has worsened and she is more uncomfortable.         Home Medications Prior to Admission medications   Medication Sig Start Date End Date Taking? Authorizing Provider  amoxicillin -clavulanate (AUGMENTIN ) 875-125 MG tablet Take 1 tablet by mouth every 12 (twelve) hours. 08/24/23  Yes Sueellen Emery, MD  ondansetron  (ZOFRAN -ODT) 4 MG disintegrating tablet Take 1 tablet (4 mg total) by mouth every 8 (eight) hours as needed for nausea or vomiting. 08/24/23  Yes Elkin Belfield, MD  traMADol (ULTRAM) 50 MG tablet Take 1 tablet (50 mg total) by mouth every 6 (six) hours as needed. 08/24/23  Yes Sueellen Emery, MD  amLODipine  (NORVASC ) 5 MG tablet Take 1 tablet (5 mg total) by mouth daily. 08/23/23   Zorita Hiss, FNP  Ascorbic Acid 500 MG CAPS as directed Orally    [provider]  BLACK COHOSH PO Take by mouth daily. Take one Patient not taking: Reported on 08/23/2023    [provider]  Cholecalciferol  (VITAMIN D3 PO) Take by mouth daily. Take 5000 iu    [provider]  Multiple Vitamin (MULTIVITAMIN PO) Take by mouth daily. Lonne Roan own women take one daily    [provider]  Multiple Vitamins-Minerals (ZINC PO) Take 50 mg by mouth. Take one pill daily    [provider]  OVER THE COUNTER MEDICATION 500 mg daily. [place] tail mushroom vitamin-take one daily    [provider]  OVER THE COUNTER MEDICATION Oil of oregano drops-take first 5 days  of the month    [provider]  triamterene -hydrochlorothiazide (DYAZIDE) 37.5-25 MG capsule TAKE 1 CAPSULE BY MOUTH EVERY DAY 06/07/23   Arcadio Knuckles, MD      Allergies    Hydrocodone  and Oxycodone     Review of Systems   Review of Systems  Gastrointestinal:  Positive for abdominal pain, nausea and vomiting.  All other systems reviewed and are negative.   Physical Exam Updated Vital Signs BP 135/84 (BP Location: Left Arm)   Pulse 88   Temp 99.6 F (37.6 C)   Resp 16   LMP 08/22/2020 (Approximate)   SpO2 100%  Physical Exam Vitals and nursing note reviewed.  Constitutional:      Appearance: She is well-developed.  HENT:     Head: Normocephalic and atraumatic.     Mouth/Throat:     Mouth: Mucous membranes are dry.     Pharynx: Oropharynx is clear.  Eyes:     Extraocular Movements: Extraocular movements intact.     Pupils: Pupils are equal, round, and reactive to light.  Cardiovascular:     Rate and Rhythm: Regular rhythm. Tachycardia present.  Pulmonary:     Effort: Pulmonary effort is normal.     Breath sounds: Normal breath sounds.  Abdominal:     General: Abdomen is flat. Bowel sounds are normal.     Palpations: Abdomen is soft.  Tenderness: There is abdominal tenderness in the right lower quadrant.  Skin:    General: Skin is warm.     Capillary Refill: Capillary refill takes less than 2 seconds.  Neurological:     General: No focal deficit present.     Mental Status: She is alert and oriented to person, place, and time.  Psychiatric:        Mood and Affect: Mood normal.        Behavior: Behavior normal.     ED Results / Procedures / Treatments   Labs (all labs ordered are listed, but only abnormal results are displayed) Labs Reviewed  CBC WITH DIFFERENTIAL/PLATELET - Abnormal; Notable for the following components:      Result Value   WBC 12.5 (*)    Neutro Abs 9.6 (*)    Monocytes Absolute 1.1 (*)    All other components within normal  limits  COMPREHENSIVE METABOLIC PANEL WITH GFR - Abnormal; Notable for the following components:   Potassium 3.2 (*)    Chloride 93 (*)    Glucose, Bld 139 (*)    Total Protein 8.3 (*)    Total Bilirubin 1.9 (*)    Anion gap 16 (*)    All other components within normal limits  CBG MONITORING, ED - Abnormal; Notable for the following components:   Glucose-Capillary 150 (*)    All other components within normal limits  LIPASE, BLOOD  URINALYSIS, ROUTINE W REFLEX MICROSCOPIC    EKG None  Radiology CT ABDOMEN PELVIS W CONTRAST Result Date: 08/24/2023 CLINICAL DATA:  Right lower quadrant abdominal pain. EXAM: CT ABDOMEN AND PELVIS WITH CONTRAST TECHNIQUE: Multidetector CT imaging of the abdomen and pelvis was performed using the standard protocol following bolus administration of intravenous contrast. RADIATION DOSE REDUCTION: This exam was performed according to the departmental dose-optimization program which includes automated exposure control, adjustment of the mA and/or kV according to patient size and/or use of iterative reconstruction technique. CONTRAST:  OMNIPAQUE  IOHEXOL  300 MG/ML  SOLN COMPARISON:  CT abdomen pelvis dated 12/17/2018. FINDINGS: Lower chest: The visualized lung bases are clear. No intra-abdominal free air.  Small free fluid in the pelvis. Hepatobiliary: Fatty liver. No biliary dilatation. The gallbladder is unremarkable. Pancreas: Unremarkable. No pancreatic ductal dilatation or surrounding inflammatory changes. Spleen: Normal in size without focal abnormality. Adrenals/Urinary Tract: The adrenal glands unremarkable. The kidneys, visualized ureters, and urinary bladder appear unremarkable. Stomach/Bowel: There is sigmoid diverticulosis without active inflammatory changes consistent with acute diverticulitis. No diverticular abscess or perforation. There is no bowel obstruction. The appendix is normal. Vascular/Lymphatic: Mild aortoiliac atherosclerotic disease. The IVC  is unremarkable. No portal venous gas. There is no adenopathy. Reproductive: The uterus is anteverted. Right tubal occlusion device. The left tubal occlusion device appears of side of the uterus in the left region of the left adnexal similar to prior CT. Other: Broad-based ventral hernia with focal protrusion of a segment of the transverse colon. Musculoskeletal: No acute osseous pathology. Old compression fracture of the superior endplate of L3. IMPRESSION: 1. Acute sigmoid diverticulitis. No diverticular abscess or perforation. 2. Fatty liver. 3. Broad-based ventral hernia with focal protrusion of a segment of the transverse colon. No bowel obstruction. Normal appendix. 4.  Aortic Atherosclerosis (ICD10-I70.0). Electronically Signed   By: Angus Bark M.D.   On: 08/24/2023 13:49    Procedures Procedures    Medications Ordered in ED Medications  sodium chloride  0.9 % bolus 1,000 mL (0 mLs Intravenous Stopped 08/24/23 1220)  morphine (PF)  4 MG/ML injection 4 mg (4 mg Intravenous Given 08/24/23 1102)  ondansetron  (ZOFRAN ) injection 4 mg (4 mg Intravenous Given 08/24/23 1102)  iohexol  (OMNIPAQUE ) 300 MG/ML solution 100 mL (100 mLs Intravenous Contrast Given 08/24/23 1211)  ondansetron  (ZOFRAN ) injection 4 mg (4 mg Intravenous Given 08/24/23 1340)    ED Course/ Medical Decision Making/ A&P                                 Medical Decision Making Amount and/or Complexity of Data Reviewed Labs: ordered. Radiology: ordered.  Risk Prescription drug management.   This patient presents to the ED for concern of abd pain, this involves an extensive number of treatment options, and is a complaint that carries with it a high risk of complications and morbidity.  The differential diagnosis includes appy, uti, diverticulitis   Co morbidities that complicate the patient evaluation   migraines, anxiety, depression, htn, anemia, and arthritis   Additional history obtained:  Additional history  obtained from epic chart review  Lab Tests:  I Ordered, and personally interpreted labs.  The pertinent results include:  cbc with wbc elevated at 12.5   Imaging Studies ordered:  I ordered imaging studies including ct abd/pelvis  I independently visualized and interpreted imaging which showed  Acute sigmoid diverticulitis. No diverticular abscess or  perforation.  2. Fatty liver.  3. Broad-based ventral hernia with focal protrusion of a segment of  the transverse colon. No bowel obstruction. Normal appendix.  4.  Aortic Atherosclerosis (ICD10-I70.0).   I agree with the radiologist interpretation   Medicines ordered and prescription drug management:  I ordered medication including ivfs/morphine/zofran   for sx  Reevaluation of the patient after these medicines showed that the patient improved I have reviewed the patients home medicines and have made adjustments as needed   Test Considered:  ct   Critical Interventions:  Pain control   Problem List / ED Course:  Diverticulitis:  pt's pain and nausea are much improved.  She is stable for d/c.  Return if worse.  F/u with pcp.  She is to also f/u with GI.   Reevaluation:  After the interventions noted above, I reevaluated the patient and found that they have :improved   Social Determinants of Health:  Lives at home   Dispostion:  After consideration of the diagnostic results and the patients response to treatment, I feel that the patent would benefit from discharge with outpatient f/u.          Final Clinical Impression(s) / ED Diagnoses Final diagnoses:  Diverticulitis    Rx / DC Orders ED Discharge Orders          Ordered    amoxicillin -clavulanate (AUGMENTIN ) 875-125 MG tablet  Every 12 hours        08/24/23 1402    ondansetron  (ZOFRAN -ODT) 4 MG disintegrating tablet  Every 8 hours PRN        08/24/23 1402    traMADol (ULTRAM) 50 MG tablet  Every 6 hours PRN        08/24/23 1402               Raine Blodgett, MD 08/24/23 1403

## 2023-08-24 NOTE — Telephone Encounter (Signed)
 Copied from CRM [id number]. Topic: Clinical - Red Word Triage >> Aug 24, 2023  7:41 AM Jenna Moan wrote: Red Word that prompted transfer to Nurse Triage: Patient says she is in extreme pain from her Stomach and was told she was getting a Ct scan order called to check on it   Chief Complaint: Pt. Seen in office yesterday. Asking if Abdominal CT has been scheduled. Pain is worse. Please advise. Symptoms: pain Frequency: Thursday Pertinent Negatives: Patient denies  Disposition: [] ED /[] Urgent Care (no appt availability in office) / [] Appointment(In office/virtual)/ []  [place] [facility]/ [] Home Care/ [] Refused Recommended Disposition /[] [place] Mobile Bus/ [x]  Follow-up with PCP Additional Notes: Advise  Reason for Disposition  [1] MILD-MODERATE pain AND [2] constant AND [3] present > 2 hours  Answer Assessment - Initial Assessment Questions 1. LOCATION: "Where does it hurt?"      All over 2. RADIATION: "Does the pain shoot anywhere else?" (e.g., chest, back)     no 3. ONSET: "When did the pain begin?" (e.g., minutes, hours or days ago)      Thursday 4. SUDDEN: "Gradual or sudden onset?"     sudden 5. PATTERN "Does the pain come and go, or is it constant?"    - If it comes and goes: "How long does it last?" "Do you have pain now?"     (Note: Comes and goes means the pain is intermittent. It goes away completely between bouts.)    - If constant: "Is it getting better, staying the same, or getting worse?"      (Note: Constant means the pain never goes away completely; most serious pain is constant and gets worse.)      Constant 6. SEVERITY: "How bad is the pain?"  (e.g., Scale 1-10; mild, moderate, or severe)    - MILD (1-3): Doesn't interfere with normal activities, abdomen soft and not tender to touch.     - MODERATE (4-7): Interferes with normal activities or awakens from sleep, abdomen tender to touch.     - SEVERE (8-10): Excruciating pain, doubled over, unable to do any  normal activities.       9 7. RECURRENT SYMPTOM: "Have you ever had this type of stomach pain before?" If Yes, ask: "When was the last time?" and "What happened that time?"      no 8. CAUSE: "What do you think is causing the stomach pain?"     unsure 9. RELIEVING/AGGRAVATING FACTORS: "What makes it better or worse?" (e.g., antacids, bending or twisting motion, bowel movement)     no 10. OTHER SYMPTOMS: "Do you have any other symptoms?" (e.g., back pain, diarrhea, fever, urination pain, vomiting)       no 11. PREGNANCY: "Is there any chance you are pregnant?" "When was your last menstrual period?"       no  Protocols used: Abdominal Pain - [facility]

## 2023-08-24 NOTE — Telephone Encounter (Signed)
Patient is currently at the ER. 

## 2023-08-24 NOTE — ED Triage Notes (Signed)
 Pt reports CP Friday night that resolved, lower ABD pain with tenderness on palpation to RLQ and n/v on sat that has worsened

## 2023-08-26 ENCOUNTER — Emergency Department (HOSPITAL_BASED_OUTPATIENT_CLINIC_OR_DEPARTMENT_OTHER)

## 2023-08-26 ENCOUNTER — Other Ambulatory Visit: Payer: Self-pay

## 2023-08-26 ENCOUNTER — Other Ambulatory Visit (HOSPITAL_BASED_OUTPATIENT_CLINIC_OR_DEPARTMENT_OTHER): Payer: Self-pay

## 2023-08-26 ENCOUNTER — Emergency Department (HOSPITAL_BASED_OUTPATIENT_CLINIC_OR_DEPARTMENT_OTHER)
Admission: EM | Admit: 2023-08-26 | Discharge: 2023-08-26 | Disposition: A | Discharge status: Home / Self Care | Attending: Emergency Medicine | Admitting: Emergency Medicine

## 2023-08-26 ENCOUNTER — Encounter (HOSPITAL_BASED_OUTPATIENT_CLINIC_OR_DEPARTMENT_OTHER): Payer: Self-pay | Admitting: Emergency Medicine

## 2023-08-26 DIAGNOSIS — R1010 Upper abdominal pain, unspecified: Secondary | ICD-10-CM | POA: Diagnosis not present

## 2023-08-26 DIAGNOSIS — R748 Abnormal levels of other serum enzymes: Secondary | ICD-10-CM | POA: Diagnosis not present

## 2023-08-26 DIAGNOSIS — R110 Nausea: Secondary | ICD-10-CM | POA: Insufficient documentation

## 2023-08-26 DIAGNOSIS — R1011 Right upper quadrant pain: Secondary | ICD-10-CM | POA: Diagnosis not present

## 2023-08-26 DIAGNOSIS — R61 Generalized hyperhidrosis: Secondary | ICD-10-CM | POA: Insufficient documentation

## 2023-08-26 LAB — CBC WITH DIFFERENTIAL/PLATELET
Abs Immature Granulocytes: 0 10*3/uL (ref 0.00–0.07)
Basophils Absolute: 0.1 10*3/uL (ref 0.0–0.1)
Basophils Relative: 1 %
Eosinophils Absolute: 0.2 10*3/uL (ref 0.0–0.5)
Eosinophils Relative: 4 %
HCT: 39.7 % (ref 36.0–46.0)
Hemoglobin: 13.6 g/dL (ref 12.0–15.0)
Immature Granulocytes: 0 %
Lymphocytes Relative: 29 %
Lymphs Abs: 1.3 10*3/uL (ref 0.7–4.0)
MCH: 29.5 pg (ref 26.0–34.0)
MCHC: 34.3 g/dL (ref 30.0–36.0)
MCV: 86.1 fL (ref 80.0–100.0)
Monocytes Absolute: 0.5 10*3/uL (ref 0.1–1.0)
Monocytes Relative: 12 %
Neutro Abs: 2.5 10*3/uL (ref 1.7–7.7)
Neutrophils Relative %: 54 %
Platelets: 346 10*3/uL (ref 150–400)
RBC: 4.61 MIL/uL (ref 3.87–5.11)
RDW: 12.3 % (ref 11.5–15.5)
WBC: 4.5 10*3/uL (ref 4.0–10.5)
nRBC: 0 % (ref 0.0–0.2)

## 2023-08-26 LAB — COMPREHENSIVE METABOLIC PANEL WITH GFR
ALT: 20 U/L (ref 0–44)
AST: 23 U/L (ref 15–41)
Albumin: 4.4 g/dL (ref 3.5–5.0)
Alkaline Phosphatase: 74 U/L (ref 38–126)
Anion gap: 12 (ref 5–15)
BUN: 12 mg/dL (ref 6–20)
CO2: 31 mmol/L (ref 22–32)
Calcium: 10.3 mg/dL (ref 8.9–10.3)
Chloride: 96 mmol/L — ABNORMAL LOW (ref 98–111)
Creatinine, Ser: 0.93 mg/dL (ref 0.44–1.00)
GFR, Estimated: >60 mL/min (ref >60)
Glucose, Bld: 152 mg/dL — ABNORMAL HIGH (ref 70–99)
Potassium: 4.3 mmol/L (ref 3.5–5.1)
Sodium: 139 mmol/L (ref 135–145)
Total Bilirubin: 0.6 mg/dL (ref 0.0–1.2)
Total Protein: 8.4 g/dL — ABNORMAL HIGH (ref 6.5–8.1)

## 2023-08-26 LAB — LIPASE, BLOOD: Lipase: 103 U/L — ABNORMAL HIGH (ref 11–51)

## 2023-08-26 MED ORDER — SUCRALFATE 1 G PO TABS: 1.0000 g | ORAL_TABLET | Freq: Two times a day (BID) | ORAL | 0 refills | Status: AC

## 2023-08-26 MED ORDER — PANTOPRAZOLE SODIUM 40 MG PO TBEC: 40.0000 mg | DELAYED_RELEASE_TABLET | Freq: Every day | ORAL | 0 refills | Status: AC

## 2023-08-26 MED ORDER — PANTOPRAZOLE SODIUM 40 MG PO TBEC
40.0000 mg | DELAYED_RELEASE_TABLET | Freq: Every day | ORAL | Status: DC
  Administered 2023-08-26: 40 mg via ORAL
  Filled 2023-08-26: qty 1

## 2023-08-26 MED ORDER — ONDANSETRON 4 MG PO TBDP
4.0000 mg | ORAL_TABLET | Freq: Once | ORAL | Status: AC
Start: 2023-08-26 — End: 2023-08-26
  Administered 2023-08-26: 4 mg via ORAL
  Filled 2023-08-26: qty 1

## 2023-08-26 MED ORDER — SUCRALFATE 1 G PO TABS
1.0000 g | ORAL_TABLET | Freq: Once | ORAL | Status: AC
  Administered 2023-08-26: 1 g via ORAL
  Filled 2023-08-26: qty 1

## 2023-08-26 NOTE — ED Provider Notes (Signed)
 [facility] DEPARTMENT AT [facility] Provider Note   CSN: [id number] Arrival date & time: 08/26/23  9562     History  Chief Complaint  Patient presents with   Abdominal Pain    Stacey Ruiz is a 48 y.o. female.  HPI    48 year old female comes in with chief complaint of abdominal pain. Patient states that she started experiencing severe abdominal pain this morning, with associated nausea, sweating.  Patient's pain is not much improved, pretty much gone.  Patient was seen in the ER few days back.  At that time she was having lower quadrant abdominal pain.  Patient had CT scan that revealed diverticulitis.  Patient was started on antibiotics and yesterday she actually felt well.  This morning, after shower as she was getting ready for work, she had sudden, sharp pain in the upper part of her abdomen, and she had to ball up in pain.  She was having nausea and felt clammy at that time.    Home Medications Prior to Admission medications   Medication Sig Start Date End Date Taking? Authorizing Provider  pantoprazole (PROTONIX) 40 MG tablet Take 1 tablet (40 mg total) by mouth daily. 08/26/23  Yes Deatra Face, MD  sucralfate (CARAFATE) 1 g tablet Take 1 tablet (1 g total) by mouth 2 (two) times daily before lunch and supper. 08/26/23  Yes Deatra Face, MD  amLODipine  (NORVASC ) 5 MG tablet Take 1 tablet (5 mg total) by mouth daily. 08/23/23   Zorita Hiss, FNP  amoxicillin -clavulanate (AUGMENTIN ) 875-125 MG tablet Take 1 tablet by mouth every 12 (twelve) hours. 08/24/23   Haviland, Julie, MD  Ascorbic Acid 500 MG CAPS as directed Orally    [provider]  Cholecalciferol  (VITAMIN D3 PO) Take by mouth daily. Take 5000 iu    [provider]  Multiple Vitamin (MULTIVITAMIN PO) Take by mouth daily. Lonne Roan own women take one daily    [provider]  Multiple Vitamins-Minerals (ZINC PO) Take 50 mg by mouth. Take one pill daily     [provider]  ondansetron  (ZOFRAN -ODT) 4 MG disintegrating tablet Take 1 tablet (4 mg total) by mouth every 8 (eight) hours as needed for nausea or vomiting. 08/24/23   Sueellen Emery, MD  OVER THE COUNTER MEDICATION 500 mg daily. [place] tail mushroom vitamin-take one daily    [provider]  OVER THE COUNTER MEDICATION Oil of oregano drops-take first 5 days of the month    [provider]  traMADol (ULTRAM) 50 MG tablet Take 1 tablet (50 mg total) by mouth every 6 (six) hours as needed. 08/24/23   Haviland, Julie, MD  triamterene -hydrochlorothiazide (DYAZIDE) 37.5-25 MG capsule TAKE 1 CAPSULE BY MOUTH EVERY DAY 06/07/23   Arcadio Knuckles, MD      Allergies    Hydrocodone  and Oxycodone     Review of Systems   Review of Systems  All other systems reviewed and are negative.   Physical Exam Updated Vital Signs BP 118/87   Pulse 80   Temp 98.1 F (36.7 C)   Resp 18   Ht 5\' 5"  (1.651 m)   Wt 90.3 kg   LMP 08/22/2020 (Approximate)   SpO2 96%   BMI 33.12 kg/m  Physical Exam Vitals and nursing note reviewed.  Constitutional:      Appearance: She is well-developed.  HENT:     Head: Atraumatic.  Cardiovascular:     Rate and Rhythm: Normal rate.  Pulmonary:  Effort: Pulmonary effort is normal.  Abdominal:     Tenderness: There is abdominal tenderness.  Musculoskeletal:     Cervical back: Normal range of motion and neck supple.  Skin:    General: Skin is warm and dry.  Neurological:     Mental Status: She is alert and oriented to person, place, and time.     ED Results / Procedures / Treatments   Labs (all labs ordered are listed, but only abnormal results are displayed) Labs Reviewed  COMPREHENSIVE METABOLIC PANEL WITH GFR - Abnormal; Notable for the following components:      Result Value   Chloride 96 (*)    Glucose, Bld 152 (*)    Total Protein 8.4 (*)    All other components within normal limits  LIPASE, BLOOD - Abnormal; Notable for  the following components:   Lipase 103 (*)    All other components within normal limits  CBC WITH DIFFERENTIAL/PLATELET    EKG None  Radiology US  Abdomen Limited RUQ (LIVER/GB) Result Date: 08/26/2023 CLINICAL DATA:  [id number] RUQ pain 151471 EXAM: ULTRASOUND ABDOMEN LIMITED RIGHT UPPER QUADRANT COMPARISON:  Aug 25, 2023 FINDINGS: Gallbladder: Moderate volume layering biliary sludge. No shadowing gallstones. No wall thickening or pericholecystic fluid. No sonographic Murphy's sign noted by sonographer. Common bile duct: Diameter: 5 mm Liver: Increased echogenicity. No focal lesion identified. No intrahepatic biliary ductal dilation. Portal vein is patent on color Doppler imaging with normal direction of blood flow towards the liver. Other: None. IMPRESSION: 1. No cholecystolithiasis or changes of acute cholecystitis. 2. Diffuse hepatic steatosis. Electronically Signed   By: Rance Burrows M.D.   On: 08/26/2023 12:15    Procedures Procedures    Medications Ordered in ED Medications  pantoprazole (PROTONIX) EC tablet 40 mg (40 mg Oral Given 08/26/23 1020)  sucralfate (CARAFATE) tablet 1 g (1 g Oral Given 08/26/23 1020)  ondansetron  (ZOFRAN -ODT) disintegrating tablet 4 mg (4 mg Oral Given 08/26/23 0912)    ED Course/ Medical Decision Making/ A&P Clinical Course as of 08/26/23 1317  Thu Aug 26, 2023  1023 Lipase, blood(!) Reassessed. On exam, RUQ pain, mild, no epigastric tenderness. Lipase elevation ? Stone. Unlikely pancreatitis. No abd pain in the epigastric region at all. Will not therefore CT again. If US  neg, will d/c with return precautions.  [AN]  1310 Pt reassessed. Pt's VSS and WNL. Pt's cap refill < 3 seconds. Pt has been hydrated in the ER and now passed po challenge. We will discharge with antiemetic. Strict ER return precautions have been discussed and pt will return if he is unable to tolerate fluids and symptoms are getting worse.  [AN]    Clinical Course User Index [AN]  Deatra Face, MD                                 Medical Decision Making Amount and/or Complexity of Data Reviewed Labs: ordered. Decision-making details documented in ED Course. Radiology: ordered.  Risk Prescription drug management.  This patient presents to the ED with chief complaint(s) of sudden onset, severe abdominal pain in the upper quadrants with pertinent past medical history of recent diagnosis of diverticulitis, family history of colon cancer.The complaint involves an extensive differential diagnosis and also carries with it a high risk of complications and morbidity.    The differential diagnosis includes  Pancreatitis, Hepatobiliary pathology including cholelithiasis and cholecystitis, Gastritis/peptic ulcer disease, small bowel obstruction, hernia related pain, acute  coronary syndrome, Aortic Dissection   The initial plan is to get basic labs, lipase and reassess the patient. Patient's pain has improved, there is no peritonitis and she had a recent CT scan therefore we will not get CT upfront.   Additional history obtained: Records reviewed recent CT scan that revealed diverticulitis.  Patient also had hernia that was seen. On exam, we do not have pronounced hernia finding.  Independent labs interpretation:  The following labs were independently interpreted: CBC is reassuring.  Metabolic profile is normal.  Patient however does have elevated lipase in the setting of abnormal LFTs.  After this, I reassessed the patient and decided to get ultrasound right upper quadrant given she had mild right upper quadrant discomfort without any Murphy's and no epigastric tenderness.  CT still not indicated at that time based on our assessment.  Independent visualization and interpretation of imaging: - I independently visualized the following imaging with scope of interpretation limited to determining acute life threatening conditions related to emergency care: , which revealed  ultrasound right upper quadrant, no evidence of gallstone.  Treatment and Reassessment: Patient reassessed again after the ultrasound.  She informs me that she feels better and wants to go home.  No abdominal pain still.  Still no indication for CT scan.  Will on PPI.  We have advised that she follow-up with her PCP next week and get repeat lipase and for GI doctor at that time to assess patient to see if she needs GI follow-up.  We will put her on PPI.  Return precautions discussed.  Patient asked me if she can go to [facility] next week for work, I informed her that there is no reason to restrict her travel, but she needs to be careful with p.o. intake.   Final Clinical Impression(s) / ED Diagnoses Final diagnoses:  Elevated lipase  Pain of upper abdomen    Rx / DC Orders ED Discharge Orders          Ordered    pantoprazole (PROTONIX) 40 MG tablet  Daily        08/26/23 1312    sucralfate (CARAFATE) 1 g tablet  2 times daily before lunch and supper        08/26/23 1312              Deatra Face, MD 08/26/23 1317

## 2023-08-26 NOTE — ED Notes (Signed)
 Pt given discharge instructions and reviewed prescriptions. Opportunities given for questions. Pt verbalizes understanding. Jillyn Hidden, RN

## 2023-08-26 NOTE — Discharge Instructions (Addendum)
 You were seen in the emergency room for abdominal pain. Fortunately, it appears that your pain resolved on its own.  The workup in the emergency room revealed slightly elevated lipase or pancreas enzyme.  It does not appear to us  that you have pancreatitis.  Ultrasound of your gallbladder is also normal.  1 possibility we have considered is that you might have peptic ulcer disease or gastritis that could be causing irritation in that area along with pain.  Please start taking the medications that are prescribed. Call your PCP for a follow-up next week, have them consider repeating your lipase to make sure it has normalized.  Discussed with them if it is important for you to follow-up with GI doctors again.  Return to the emergency room if you start having severe pain, severe nausea or vomiting.

## 2023-08-26 NOTE — ED Triage Notes (Signed)
 Pt c/o mid-abdominal pain that started Saturday. Was seen at PCP Monday, [facility] Tuesday and sent home with medications. Worsening this am

## 2023-09-01 ENCOUNTER — Ambulatory Visit: Payer: Self-pay

## 2023-09-01 NOTE — Telephone Encounter (Signed)
 Copied from CRM [id number]. Topic: Clinical - Red Word Triage >> Sep 01, 2023  1:41 PM Stacey Ruiz wrote: Red Word that prompted transfer to Nurse Triage: Patient requesting to speak with nurse due to being in [place]  and feeling like she's having to catch her breath from time to time due to the high elevation.  Chief Complaint: Mild SOB Symptoms: SOB upon exertion and at night, anxiety Frequency: Since yesterday Pertinent Negatives: Patient denies dizziness Disposition: [] ED /[x] Urgent Care (no appt availability in office) / [] Appointment(In office/virtual)/ []  [facility]/ [] Home Care/ [] Refused Recommended Disposition /[] [facility]/ []  Follow-up with PCP Additional Notes: Patient called in to report mild SOB. Patient stated she arrived in [place]  for work yesterday and believes she is experiencing SOB due to the higher altitude. Patient able to speak in clear and complete sentences while on phone with this RN. Patient denied dizziness and lightheadedness. Patient stated she has a history of anxiety that may be contributing to symptoms. Given that patient is out of state, this RN advised UC at this time. Patient verbalized understanding and agreed to go. Provided care advice and instructed patient to call back if symptoms worsen. Patient complied.   Reason for Disposition  [1] MILD difficulty breathing (e.g., minimal/no SOB at rest, SOB with walking, pulse <100) AND [2] NEW-onset or WORSE than normal  Answer Assessment - Initial Assessment Questions 1. RESPIRATORY STATUS: "Describe your breathing?" (e.g., wheezing, shortness of breath, unable to speak, severe coughing)      States she feels SOB/like she needs to stop and take a deep breath at night when she is trying to sleep 2. ONSET: "When did this breathing problem begin?"      When she arrived in [place]  yesterday  3. PATTERN "Does the difficult breathing come and go, or has it been constant since it started?"      Comes  and goes depending on what she is doing 4. SEVERITY: "How bad is your breathing?" (e.g., mild, moderate, severe)    - MILD: No SOB at rest, mild SOB with walking, speaks normally in sentences, can lie down, no retractions, pulse < 100.    - MODERATE: SOB at rest, SOB with minimal exertion and prefers to sit, cannot lie down flat, speaks in phrases, mild retractions, audible wheezing, pulse 100-120.    - SEVERE: Very SOB at rest, speaks in single words, struggling to breathe, sitting hunched forward, retractions, pulse > 120      Mild- SOB upon exertion and talking, denies SOB at this time, able to speak in clear and complete sentences while on phone with this RN 5. RECURRENT SYMPTOM: "Have you had difficulty breathing before?" If Yes, ask: "When was the last time?" and "What happened that time?"      Denies 8. CAUSE: "What do you think is causing the breathing problem?"      At higher altitude in [place]  this week for work 9. OTHER SYMPTOMS: "Do you have any other symptoms? (e.g., dizziness, runny nose, cough, chest pain, fever)     Denies dizziness and feeling faint History of anxiety  10. O2 SATURATION MONITOR:  "Do you use an oxygen saturation monitor (pulse oximeter) at home?" If Yes, ask: "What is your reading (oxygen level) today?" "What is your usual oxygen saturation reading?" (e.g., 95%)       Denies  Protocols used: Breathing Difficulty-A-AH

## 2023-09-04 NOTE — Progress Notes (Signed)
 Acute Office Visit  Subjective:     Patient ID: Stacey Ruiz, female    DOB: [birth year], 48 y.o.   MRN: [id number]  Chief Complaint  Patient presents with   Hospitalization Follow-up    Feels better since ED visit possible GI referral.    HPI Patient is in today for f/u of ER visits for diverticulitis. Reports that she is feeling better. Completed antibiotic course. Beginning to get her appetite back. Has not seen GI yet. Requesting referral today. She has stopped taking metformin, states it upsets her stomach and causes diarrhea. Denies current symptoms of nausea, vomiting, abdominal pain, diarrhea.  Separately requesting referral for therapist for anxiety. Not currently being treated for this. Denies SI, HI.   ROS Per HPI      Objective:    BP 110/80 (BP Location: Left Arm, Patient Position: Sitting)   Temp 98.1 F (36.7 C) (Temporal)   Ht 5\' 5"  (1.651 m)   Wt 202 lb (91.6 kg)   LMP 08/22/2020 (Approximate)   BMI 33.61 kg/m    Physical Exam Vitals and nursing note reviewed.  Constitutional:      General: She is not in acute distress.    Appearance: Normal appearance. She is obese.  HENT:     Head: Normocephalic and atraumatic.     Right Ear: External ear normal.     Left Ear: External ear normal.     Nose: Nose normal.     Mouth/Throat:     Mouth: Mucous membranes are moist.     Pharynx: Oropharynx is clear.  Eyes:     Extraocular Movements: Extraocular movements intact.     Pupils: Pupils are equal, round, and reactive to light.  Cardiovascular:     Rate and Rhythm: Normal rate and regular rhythm.     Pulses: Normal pulses.     Heart sounds: Normal heart sounds.  Pulmonary:     Effort: Pulmonary effort is normal. No respiratory distress.     Breath sounds: Normal breath sounds. No wheezing, rhonchi or rales.  Musculoskeletal:        General: Normal range of motion.     Cervical back: Normal range of motion.     Right lower leg: No edema.      Left lower leg: No edema.  Lymphadenopathy:     Cervical: No cervical adenopathy.  Neurological:     General: No focal deficit present.     Mental Status: She is alert and oriented to person, place, and time.  Psychiatric:        Mood and Affect: Mood normal.        Thought Content: Thought content normal.    Results for orders placed or performed in visit on 09/06/23  CBC with Differential/Platelet  Result Value Ref Range   WBC 5.2 4.0 - 10.5 K/uL   RBC 4.39 3.87 - 5.11 Mil/uL   Hemoglobin 12.7 12.0 - 15.0 g/dL   HCT 19.1 47.8 - 29.5 %   MCV 85.4 78.0 - 100.0 fl   MCHC 34.0 30.0 - 36.0 g/dL   RDW 62.1 30.8 - 65.7 %   Platelets 301.0 150.0 - 400.0 K/uL   Neutrophils Relative % 33.1 (L) 43.0 - 77.0 %   Lymphocytes Relative 54.5 (H) 12.0 - 46.0 %   Monocytes Relative 7.8 3.0 - 12.0 %   Eosinophils Relative 3.7 0.0 - 5.0 %   Basophils Relative 0.9 0.0 - 3.0 %   Neutro Abs 1.7 1.4 -  7.7 K/uL   Lymphs Abs 2.8 0.7 - 4.0 K/uL   Monocytes Absolute 0.4 0.1 - 1.0 K/uL   Eosinophils Absolute 0.2 0.0 - 0.7 K/uL   Basophils Absolute 0.0 0.0 - 0.1 K/uL  Comprehensive metabolic panel with GFR  Result Value Ref Range   Sodium 138 135 - 145 mEq/L   Potassium 3.9 3.5 - 5.1 mEq/L   Chloride 101 96 - 112 mEq/L   CO2 30 19 - 32 mEq/L   Glucose, Bld 116 (H) 70 - 99 mg/dL   BUN 11 6 - 23 mg/dL   Creatinine, Ser 0.98 0.40 - 1.20 mg/dL   Total Bilirubin 0.4 0.2 - 1.2 mg/dL   Alkaline Phosphatase 64 39 - 117 U/L   AST 18 0 - 37 U/L   ALT 18 0 - 35 U/L   Total Protein 7.6 6.0 - 8.3 g/dL   Albumin 4.4 3.5 - 5.2 g/dL   GFR 11.91 >47.82 mL/min   Calcium 9.4 8.4 - 10.5 mg/dL  Amylase  Result Value Ref Range   Amylase 91 27 - 131 U/L  Lipase  Result Value Ref Range   Lipase 17.0 11.0 - 59.0 U/L        Assessment & Plan:   RLQ abdominal pain -     CBC with Differential/Platelet -     Comprehensive metabolic panel with GFR -     Amylase -     Lipase -     Ambulatory referral to  Gastroenterology  GAD (generalized anxiety disorder) -     Ambulatory referral to Psychology  MDD (major depressive disorder), recurrent episode, mild (HCC) -     Ambulatory referral to Psychology  Class 1 obesity due to excess calories with serious comorbidity and body mass index (BMI) of 33.0 to 33.9 in adult -     Ambulatory referral to Gastroenterology  Diverticulitis -     CBC with Differential/Platelet -     Comprehensive metabolic panel with GFR -     Amylase -     Lipase -     Ambulatory referral to Gastroenterology     No orders of the defined types were placed in this encounter.   Return if symptoms worsen or fail to improve, for with PCP as scheduled.  Wellington Half, FNP

## 2023-09-06 ENCOUNTER — Ambulatory Visit: Admitting: Family Medicine

## 2023-09-06 ENCOUNTER — Encounter: Payer: Self-pay | Admitting: Family Medicine

## 2023-09-06 VITALS — BP 110/80 | Temp 98.1°F | Ht 65.0 in | Wt 202.0 lb

## 2023-09-06 DIAGNOSIS — R1031 Right lower quadrant pain: Secondary | ICD-10-CM | POA: Diagnosis not present

## 2023-09-06 DIAGNOSIS — F330 Major depressive disorder, recurrent, mild: Secondary | ICD-10-CM

## 2023-09-06 DIAGNOSIS — E6609 Other obesity due to excess calories: Secondary | ICD-10-CM

## 2023-09-06 DIAGNOSIS — E66811 Obesity, class 1: Secondary | ICD-10-CM | POA: Diagnosis not present

## 2023-09-06 DIAGNOSIS — K5792 Diverticulitis of intestine, part unspecified, without perforation or abscess without bleeding: Secondary | ICD-10-CM | POA: Diagnosis not present

## 2023-09-06 DIAGNOSIS — Z6833 Body mass index (BMI) 33.0-33.9, adult: Secondary | ICD-10-CM

## 2023-09-06 DIAGNOSIS — F411 Generalized anxiety disorder: Secondary | ICD-10-CM

## 2023-09-06 LAB — COMPREHENSIVE METABOLIC PANEL WITH GFR
ALT: 18 U/L (ref 0–35)
AST: 18 U/L (ref 0–37)
Albumin: 4.4 g/dL (ref 3.5–5.2)
Alkaline Phosphatase: 64 U/L (ref 39–117)
BUN: 11 mg/dL (ref 6–23)
CO2: 30 meq/L (ref 19–32)
Calcium: 9.4 mg/dL (ref 8.4–10.5)
Chloride: 101 meq/L (ref 96–112)
Creatinine, Ser: 0.82 mg/dL (ref 0.40–1.20)
GFR: 84.06 mL/min (ref >60.00)
Glucose, Bld: 116 mg/dL — ABNORMAL HIGH (ref 70–99)
Potassium: 3.9 meq/L (ref 3.5–5.1)
Sodium: 138 meq/L (ref 135–145)
Total Bilirubin: 0.4 mg/dL (ref 0.2–1.2)
Total Protein: 7.6 g/dL (ref 6.0–8.3)

## 2023-09-06 LAB — CBC WITH DIFFERENTIAL/PLATELET
Basophils Absolute: 0 10*3/uL (ref 0.0–0.1)
Basophils Relative: 0.9 % (ref 0.0–3.0)
Eosinophils Absolute: 0.2 10*3/uL (ref 0.0–0.7)
Eosinophils Relative: 3.7 % (ref 0.0–5.0)
HCT: 37.5 % (ref 36.0–46.0)
Hemoglobin: 12.7 g/dL (ref 12.0–15.0)
Lymphocytes Relative: 54.5 % — ABNORMAL HIGH (ref 12.0–46.0)
Lymphs Abs: 2.8 10*3/uL (ref 0.7–4.0)
MCHC: 34 g/dL (ref 30.0–36.0)
MCV: 85.4 fl (ref 78.0–100.0)
Monocytes Absolute: 0.4 10*3/uL (ref 0.1–1.0)
Monocytes Relative: 7.8 % (ref 3.0–12.0)
Neutro Abs: 1.7 10*3/uL (ref 1.4–7.7)
Neutrophils Relative %: 33.1 % — ABNORMAL LOW (ref 43.0–77.0)
Platelets: 301 10*3/uL (ref 150.0–400.0)
RBC: 4.39 Mil/uL (ref 3.87–5.11)
RDW: 12.6 % (ref 11.5–15.5)
WBC: 5.2 10*3/uL (ref 4.0–10.5)

## 2023-09-06 LAB — AMYLASE: Amylase: 91 U/L (ref 27–131)

## 2023-09-06 LAB — LIPASE: Lipase: 17 U/L (ref 11.0–59.0)

## 2023-09-06 NOTE — Patient Instructions (Addendum)
 We are checking labs today, will be in contact with any results that require further attention.  Collagen yogurt.  We have placed a referral for you today. Someone will be reaching out to get you scheduled.  Pepcid before spicy foods  Culturelle or florastor as probiotics

## 2023-09-09 ENCOUNTER — Ambulatory Visit: Payer: Self-pay | Admitting: Family Medicine

## 2023-09-12 DIAGNOSIS — F330 Major depressive disorder, recurrent, mild: Secondary | ICD-10-CM | POA: Insufficient documentation

## 2023-09-16 ENCOUNTER — Other Ambulatory Visit: Payer: Self-pay | Admitting: Internal Medicine

## 2023-09-16 DIAGNOSIS — E876 Hypokalemia: Secondary | ICD-10-CM

## 2023-09-16 DIAGNOSIS — I10 Essential (primary) hypertension: Secondary | ICD-10-CM

## 2023-09-19 ENCOUNTER — Telehealth: Admitting: Physician Assistant

## 2023-09-19 DIAGNOSIS — B9689 Other specified bacterial agents as the cause of diseases classified elsewhere: Secondary | ICD-10-CM

## 2023-09-19 DIAGNOSIS — J019 Acute sinusitis, unspecified: Secondary | ICD-10-CM | POA: Diagnosis not present

## 2023-09-20 MED ORDER — AMOXICILLIN-POT CLAVULANATE 875-125 MG PO TABS: 1.0000 | ORAL_TABLET | Freq: Two times a day (BID) | ORAL | 0 refills | Status: DC

## 2023-09-20 NOTE — Progress Notes (Signed)

## 2023-09-23 ENCOUNTER — Encounter: Payer: Self-pay | Admitting: Emergency Medicine

## 2023-09-23 ENCOUNTER — Ambulatory Visit: Admitting: Emergency Medicine

## 2023-09-23 VITALS — BP 126/74 | HR 102 | Temp 98.4°F | Ht 65.0 in | Wt 192.0 lb

## 2023-09-23 DIAGNOSIS — J069 Acute upper respiratory infection, unspecified: Secondary | ICD-10-CM | POA: Diagnosis not present

## 2023-09-23 DIAGNOSIS — R6889 Other general symptoms and signs: Secondary | ICD-10-CM | POA: Insufficient documentation

## 2023-09-23 MED ORDER — PREDNISONE 20 MG PO TABS: 20.0000 mg | ORAL_TABLET | Freq: Every day | ORAL | 0 refills | Status: AC

## 2023-09-23 NOTE — Progress Notes (Signed)
 Stacey Ruiz 48 y.o.   Chief Complaint  Patient presents with   Nasal Congestion    Patient here for cold/flu symptom, started last Tuesday with sniffle, loss of voice, congestion, yellow -green mucus, some SOB, head/ face pressure, some left ear pop. Patient is taking Augmentin  that was given to her during an Evisit on Monday     HISTORY OF PRESENT ILLNESS: Acute problem visit today.  Patient of Dr. Sandra Crouch. This is a 48 y.o. female complaining of flulike symptoms that started about 10 days ago with chest congestion, productive cough, headaches, earaches, loss of voice Started taking Augmentin  2 days ago.  Feeling better today. No other complaints or medical concerns today.  HPI   Prior to Admission medications   Medication Sig Start Date End Date Taking? Authorizing Provider  amLODipine  (NORVASC ) 5 MG tablet Take 1 tablet (5 mg total) by mouth daily. 08/23/23  Yes Hershel Los F, FNP  amoxicillin -clavulanate (AUGMENTIN ) 875-125 MG tablet Take 1 tablet by mouth 2 (two) times daily. 09/20/23  Yes Angelia Kelp, PA-C  Ascorbic Acid 500 MG CAPS as directed Orally   Yes [provider]  Cholecalciferol  (VITAMIN D3 PO) Take by mouth daily. Take 5000 iu   Yes [provider]  Multiple Vitamin (MULTIVITAMIN PO) Take by mouth daily. Lonne Roan own women take one daily   Yes [provider]  Multiple Vitamins-Minerals (ZINC PO) Take 50 mg by mouth. Take one pill daily   Yes [provider]  ondansetron  (ZOFRAN -ODT) 4 MG disintegrating tablet Take 1 tablet (4 mg total) by mouth every 8 (eight) hours as needed for nausea or vomiting. 08/24/23  Yes Sueellen Emery, MD  OVER THE COUNTER MEDICATION 500 mg daily. [place] tail mushroom vitamin-take one daily   Yes [provider]  OVER THE COUNTER MEDICATION Oil of oregano drops-take first 5 days of the month   Yes [provider]  predniSONE (DELTASONE) 20 MG tablet Take 1 tablet (20 mg  total) by mouth daily with breakfast for 5 days. 09/23/23 09/28/23 Yes Steven Basso, Isidro Margo, MD  sucralfate  (CARAFATE ) 1 g tablet Take 1 tablet (1 g total) by mouth 2 (two) times daily before lunch and supper. 08/26/23  Yes Deatra Face, MD  pantoprazole  (PROTONIX ) 40 MG tablet Take 1 tablet (40 mg total) by mouth daily. Patient not taking: Reported on 09/23/2023 08/26/23   Deatra Face, MD  traMADol  (ULTRAM ) 50 MG tablet Take 1 tablet (50 mg total) by mouth every 6 (six) hours as needed. Patient not taking: Reported on 09/23/2023 08/24/23   Sueellen Emery, MD  triamterene -hydrochlorothiazide (DYAZIDE) 37.5-25 MG capsule TAKE 1 CAPSULE BY MOUTH EVERY DAY. Patient will need to follow up with PCP prior to future refills Patient not taking: Reported on 09/23/2023 09/17/23   Arcadio Knuckles, MD    Allergies  Allergen Reactions   Hydrocodone  Itching and Nausea And Vomiting   Oxycodone  Itching and Nausea And Vomiting    Patient Active Problem List   Diagnosis Date Noted   Flu-like symptoms 09/23/2023   MDD (major depressive disorder), recurrent episode, mild (HCC) 09/12/2023   RLQ abdominal pain 09/06/2023   GAD (generalized anxiety disorder) 09/06/2023   DOE (dyspnea on exertion) 01/12/2023   Hypercalcemia 01/12/2023   Prediabetes 06/18/2022   Screen for colon cancer 05/13/2021   Encounter for general adult medical examination with abnormal findings 05/13/2021   Psychophysiological insomnia 01/31/2019   Severe episode of recurrent major depressive disorder, without psychotic features (HCC) 12/27/2018  Vitamin D  deficiency 07/27/2017   Hot flashes, menopausal 07/16/2017   Visit for screening mammogram 05/20/2015   Viral upper respiratory tract infection 04/19/2015   Class 1 obesity due to excess calories with serious comorbidity and body mass index (BMI) of 33.0 to 33.9 in adult 11/28/2012   Fear of flying 10/12/2011   Essential hypertension, benign 09/07/2011   Routine general medical  examination at a health care facility 09/07/2011   Contraceptive management 11/28/2010    Past Medical History:  Diagnosis Date   Anemia    Anxiety    Arthritis    knee right   Depression    Headache(784.0)    migraines   Hypertension     Past Surgical History:  Procedure Laterality Date   ABLATION     FRACTURE SURGERY  04/06/2000   left wrist   TUBAL LIGATION  11/28/2010   Procedure: ESSURE TUBAL STERILIZATION;  Surgeon: Truitt Gaba, MD;  Location: [facility];  Service: Gynecology;  Laterality: N/A;  Attempted essure sterilization. Essure device placed in leftt side only. could not do procedure on right fallopian tube.    TUBAL LIGATION  12/05/2010   Procedure: ESSURE TUBAL STERILIZATION;  Surgeon: Truitt Gaba, MD;  Location: [facility];  Service: Gynecology;  Laterality: Right;   WISDOM TOOTH EXTRACTION      Social History   Socioeconomic History   Marital status: Widowed    Spouse name: Not on file   Number of children: Not on file   Years of education: Not on file   Highest education level: Not on file  Occupational History   Not on file  Tobacco Use   Smoking status: Former    Current packs/day: 0.00    Types: Cigarettes    Quit date: 11/12/2007    Years since quitting: 15.8    Passive exposure: Never   Smokeless tobacco: Never  Vaping Use   Vaping status: Never Used  Substance and Sexual Activity   Alcohol use: Yes    Alcohol/week: 7.0 standard drinks of alcohol    Types: 7 Glasses of wine per week    Comment: occ   Drug use: Not Currently    Types: Marijuana    Comment: gummies   Sexual activity: Never    Birth control/protection: Surgical  Other Topics Concern   Not on file  Social History Narrative   Not on file   Social Drivers of Health   Financial Resource Strain: Not on file  Food Insecurity: Not on file  Transportation Needs: Not on file  Physical Activity: Not on file  Stress: Not on file  Social Connections: Not on file   Intimate Partner Violence: Not on file    Family History  Problem Relation Age of Onset   Colon cancer Mother    Cancer Mother    Breast cancer Maternal Aunt 64   Diabetes Neg Hx    Early death Neg Hx    Heart disease Neg Hx    Hyperlipidemia Neg Hx    Hypertension Neg Hx    Kidney disease Neg Hx    Stroke Neg Hx    Crohn's disease Neg Hx    Esophageal cancer Neg Hx    Rectal cancer Neg Hx    Stomach cancer Neg Hx      Review of Systems  Constitutional: Negative.  Negative for chills and fever.  HENT:  Positive for congestion. Negative for sore throat.   Respiratory:  Positive for cough and sputum  production.   Cardiovascular: Negative.  Negative for chest pain and palpitations.  Gastrointestinal:  Negative for abdominal pain, diarrhea, nausea and vomiting.  Genitourinary: Negative.  Negative for dysuria and hematuria.  Neurological:  Positive for headaches.  All other systems reviewed and are negative.   Vitals:   09/23/23 1035  BP: 126/74  Pulse: (!) 102  Temp: 98.4 F (36.9 C)  SpO2: 98%    Physical Exam Vitals reviewed.  Constitutional:      Appearance: Normal appearance.  HENT:     Head: Normocephalic.     Right Ear: Tympanic membrane, ear canal and external ear normal.     Left Ear: Tympanic membrane, ear canal and external ear normal.     Mouth/Throat:     Mouth: Mucous membranes are moist.     Pharynx: Oropharynx is clear.   Eyes:     Extraocular Movements: Extraocular movements intact.     Pupils: Pupils are equal, round, and reactive to light.    Cardiovascular:     Rate and Rhythm: Normal rate and regular rhythm.     Pulses: Normal pulses.     Heart sounds: Normal heart sounds.  Pulmonary:     Effort: Pulmonary effort is normal.     Breath sounds: Normal breath sounds.   Musculoskeletal:     Cervical back: No tenderness.  Lymphadenopathy:     Cervical: No cervical adenopathy.   Skin:    General: Skin is warm and dry.     Capillary  Refill: Capillary refill takes less than 2 seconds.   Neurological:     General: No focal deficit present.     Mental Status: She is alert and oriented to person, place, and time.   Psychiatric:        Mood and Affect: Mood normal.        Behavior: Behavior normal.      ASSESSMENT & PLAN: A total of 33 minutes was spent with the patient and counseling/coordination of care regarding preparing for this visit, review of most recent office visit notes, review of chronic medical conditions under management, review of all medications, diagnosis of upper viral respiratory infection, symptom management, prognosis, documentation and need for follow-up if no better or worse during the next several days.  Problem List Items Addressed This Visit       Respiratory   Viral upper respiratory tract infection - Primary   Clinically stable.  Running its course without complications Continue and finish Augmentin  875 mg twice a day Symptom management discussed Advised to rest and stay well-hydrated Advised to contact the office if no better or worse during the next several days.      Relevant Medications   predniSONE (DELTASONE) 20 MG tablet     Other   Flu-like symptoms   Symptom management discussed Continue over-the-counter Mucinex  DM and NyQuil at bedtime Will benefit from daily prednisone 20 mg for 5 days Advised to rest and stay well-hydrated Advised to contact the office if no better or worse during the next several days.      Relevant Medications   predniSONE (DELTASONE) 20 MG tablet    Patient Instructions  Upper Respiratory Infection, Adult An upper respiratory infection (URI) affects the nose, throat, and upper airways that lead to the lungs. The most common type of URI is often called the common cold. URIs usually get better on their own, without medical treatment. What are the causes? A URI is caused by a germ (virus). You may  catch these germs by: Breathing in droplets from  an infected person's cough or sneeze. Touching something that has the germ on it (is contaminated) and then touching your mouth, nose, or eyes. What increases the risk? You are more likely to get a URI if: You are very young or very old. You have close contact with others, such as at work, school, or a health care facility. You smoke. You have long-term (chronic) heart or lung disease. You have a weakened disease-fighting system (immune system). You have nasal allergies or asthma. You have a lot of stress. You have poor nutrition. What are the signs or symptoms? Runny or stuffy (congested) nose. Cough. Sneezing. Sore throat. Headache. Feeling tired (fatigue). Fever. Not wanting to eat as much as usual. Pain in your forehead, behind your eyes, and over your cheekbones (sinus pain). Muscle aches. Redness or irritation of the eyes. Pressure in the ears or face. How is this treated? URIs usually get better on their own within 7-10 days. Medicines cannot cure URIs, but your doctor may recommend certain medicines to help relieve symptoms, such as: Over-the-counter cold medicines. Medicines to reduce coughing (cough suppressants). Coughing is a type of defense against infection that helps to clear the nose, throat, windpipe, and lungs (respiratory system). Take these medicines only as told by your doctor. Medicines to lower your fever. Follow these instructions at home: Activity Rest as needed. If you have a fever, stay home from work or school until your fever is gone, or until your doctor says you may return to work or school. You should stay home until you cannot spread the infection anymore (you are not contagious). Your doctor may have you wear a face mask so you have less risk of spreading the infection. Relieving symptoms Rinse your mouth often with salt water. To make salt water, dissolve -1 tsp (3-6 g) of salt in 1 cup (237 mL) of warm water. Use a cool-mist humidifier to add  moisture to the air. This can help you breathe more easily. Eating and drinking  Drink enough fluid to keep your pee (urine) pale yellow. Eat soups and other clear broths. General instructions  Take over-the-counter and prescription medicines only as told by your doctor. Do not smoke or use any products that contain nicotine or tobacco. If you need help quitting, ask your doctor. Avoid being where people are smoking (avoid secondhand smoke). Stay up to date on all your shots (immunizations), and get the flu shot every year. Keep all follow-up visits. How to prevent the spread of infection to others  Wash your hands with soap and water for at least 20 seconds. If you cannot use soap and water, use hand sanitizer. Avoid touching your mouth, face, eyes, or nose. Cough or sneeze into a tissue or your sleeve or elbow. Do not cough or sneeze into your hand or into the air. Contact a doctor if: You are getting worse, not better. You have any of these: A fever or chills. Brown or red mucus in your nose. Yellow or brown fluid (discharge)coming from your nose. Pain in your face, especially when you bend forward. Swollen neck glands. Pain when you swallow. White areas in the back of your throat. Get help right away if: You have shortness of breath that gets worse. You have very bad or constant: Headache. Ear pain. Pain in your forehead, behind your eyes, and over your cheekbones (sinus pain). Chest pain. You have long-lasting (chronic) lung disease along with any of these:  Making high-pitched whistling sounds when you breathe, most often when you breathe out (wheezing). Long-lasting cough (more than 14 days). Coughing up blood. A change in your usual mucus. You have a stiff neck. You have changes in your: Vision. Hearing. Thinking. Mood. These symptoms may be an emergency. Get help right away. Call 911. Do not wait to see if the symptoms will go away. Do not drive yourself to the  hospital. Summary An upper respiratory infection (URI) is caused by a germ (virus). The most common type of URI is often called the common cold. URIs usually get better within 7-10 days. Take over-the-counter and prescription medicines only as told by your doctor. This information is not intended to replace advice given to you by your health care provider. Make sure you discuss any questions you have with your health care provider. Document Revised: 10/23/2020 Document Reviewed: 10/23/2020 Elsevier Patient Education  2024 Elsevier Inc.    Maryagnes Small, MD [facility]

## 2023-09-23 NOTE — Assessment & Plan Note (Addendum)
 Symptom management discussed Continue over-the-counter Mucinex  DM and NyQuil at bedtime Will benefit from daily prednisone 20 mg for 5 days Advised to rest and stay well-hydrated Advised to contact the office if no better or worse during the next several days.

## 2023-09-23 NOTE — Patient Instructions (Signed)

## 2023-09-23 NOTE — Assessment & Plan Note (Signed)
 Clinically stable.  Running its course without complications Continue and finish Augmentin  875 mg twice a day Symptom management discussed Advised to rest and stay well-hydrated Advised to contact the office if no better or worse during the next several days.

## 2023-10-11 DIAGNOSIS — E1165 Type 2 diabetes mellitus with hyperglycemia: Secondary | ICD-10-CM | POA: Diagnosis not present

## 2023-10-11 DIAGNOSIS — E781 Pure hyperglyceridemia: Secondary | ICD-10-CM | POA: Diagnosis not present

## 2023-10-18 DIAGNOSIS — E1165 Type 2 diabetes mellitus with hyperglycemia: Secondary | ICD-10-CM | POA: Diagnosis not present

## 2023-10-18 DIAGNOSIS — I10 Essential (primary) hypertension: Secondary | ICD-10-CM | POA: Diagnosis not present

## 2023-10-18 DIAGNOSIS — E559 Vitamin D deficiency, unspecified: Secondary | ICD-10-CM | POA: Diagnosis not present

## 2023-10-28 ENCOUNTER — Other Ambulatory Visit (INDEPENDENT_AMBULATORY_CARE_PROVIDER_SITE_OTHER)

## 2023-10-28 ENCOUNTER — Ambulatory Visit: Admitting: Gastroenterology

## 2023-10-28 ENCOUNTER — Encounter: Payer: Self-pay | Admitting: Gastroenterology

## 2023-10-28 VITALS — BP 132/78 | HR 89 | Ht 65.0 in | Wt 197.1 lb

## 2023-10-28 DIAGNOSIS — K439 Ventral hernia without obstruction or gangrene: Secondary | ICD-10-CM | POA: Diagnosis not present

## 2023-10-28 DIAGNOSIS — Z800 Family history of malignant neoplasm of digestive organs: Secondary | ICD-10-CM

## 2023-10-28 DIAGNOSIS — R109 Unspecified abdominal pain: Secondary | ICD-10-CM

## 2023-10-28 DIAGNOSIS — K760 Fatty (change of) liver, not elsewhere classified: Secondary | ICD-10-CM

## 2023-10-28 DIAGNOSIS — K5792 Diverticulitis of intestine, part unspecified, without perforation or abscess without bleeding: Secondary | ICD-10-CM | POA: Diagnosis not present

## 2023-10-28 DIAGNOSIS — R198 Other specified symptoms and signs involving the digestive system and abdomen: Secondary | ICD-10-CM

## 2023-10-28 DIAGNOSIS — R194 Change in bowel habit: Secondary | ICD-10-CM | POA: Diagnosis not present

## 2023-10-28 DIAGNOSIS — Z8719 Personal history of other diseases of the digestive system: Secondary | ICD-10-CM

## 2023-10-28 LAB — TSH: TSH: 1.25 u[IU]/mL (ref 0.35–5.50)

## 2023-10-28 LAB — CBC WITH DIFFERENTIAL/PLATELET
Basophils Absolute: 0.1 K/uL (ref 0.0–0.1)
Basophils Relative: 1.2 % (ref 0.0–3.0)
Eosinophils Absolute: 0.1 K/uL (ref 0.0–0.7)
Eosinophils Relative: 2.1 % (ref 0.0–5.0)
HCT: 39.3 % (ref 36.0–46.0)
Hemoglobin: 13.3 g/dL (ref 12.0–15.0)
Lymphocytes Relative: 43.9 % (ref 12.0–46.0)
Lymphs Abs: 2.4 K/uL (ref 0.7–4.0)
MCHC: 33.7 g/dL (ref 30.0–36.0)
MCV: 85.2 fl (ref 78.0–100.0)
Monocytes Absolute: 0.3 K/uL (ref 0.1–1.0)
Monocytes Relative: 5.9 % (ref 3.0–12.0)
Neutro Abs: 2.5 K/uL (ref 1.4–7.7)
Neutrophils Relative %: 46.9 % (ref 43.0–77.0)
Platelets: 319 K/uL (ref 150.0–400.0)
RBC: 4.62 Mil/uL (ref 3.87–5.11)
RDW: 13 % (ref 11.5–15.5)
WBC: 5.4 K/uL (ref 4.0–10.5)

## 2023-10-28 LAB — COMPREHENSIVE METABOLIC PANEL WITH GFR
ALT: 14 U/L (ref 0–35)
AST: 16 U/L (ref 0–37)
Albumin: 4.6 g/dL (ref 3.5–5.2)
Alkaline Phosphatase: 75 U/L (ref 39–117)
BUN: 9 mg/dL (ref 6–23)
CO2: 36 meq/L — ABNORMAL HIGH (ref 19–32)
Calcium: 9.8 mg/dL (ref 8.4–10.5)
Chloride: 100 meq/L (ref 96–112)
Creatinine, Ser: 0.91 mg/dL (ref 0.40–1.20)
GFR: 74.11 mL/min (ref >60.00)
Glucose, Bld: 170 mg/dL — ABNORMAL HIGH (ref 70–99)
Potassium: 4.2 meq/L (ref 3.5–5.1)
Sodium: 142 meq/L (ref 135–145)
Total Bilirubin: 0.6 mg/dL (ref 0.2–1.2)
Total Protein: 7.5 g/dL (ref 6.0–8.3)

## 2023-10-28 MED ORDER — IBGARD 90 MG PO CPCR: ORAL_CAPSULE | ORAL | Status: AC

## 2023-10-28 NOTE — Patient Instructions (Addendum)
 Your provider has requested that you go to the basement level for lab work before leaving today. Press B on the elevator. The lab is located at the first door on the left as you exit the elevator.   We have given you samples of the following medication to take: Ibgard  A high fiber diet with plenty of fluids (up to 8 glasses of water daily) is suggested to relieve these symptoms.  Benefiber, 1 tablespoon once or twice daily can be used to keep bowels regular if needed.  Do a trial of dairy elimination for 1 month.  We are referring you to [facility] for ventral hernia. Their office will call you to schedule an appointment. If you haven't heard from them within 5 business days feel free to call their office.   [facility] [address], [place], [place] [address] Office hours: M-F [phone]  _______________________________________________________  If your blood pressure at your visit was 140/90 or greater, please contact your primary care physician to follow up on this.  _______________________________________________________  If you are age 49 or older, your body mass index should be between 23-30. Your Body mass index is 32.8 kg/m. If this is out of the aforementioned range listed, please consider follow up with your Primary Care Provider.  If you are age 81 or younger, your body mass index should be between 19-25. Your Body mass index is 32.8 kg/m. If this is out of the aformentioned range listed, please consider follow up with your Primary Care Provider.   ________________________________________________________  The [facility] providers would like to encourage you to use MYCHART to communicate with providers for non-urgent requests or questions.  Due to long hold times on the telephone, sending your provider a message by [facility] may be a faster and more efficient way to get a response.  Please allow 48 business hours for a response.   Please remember that this is for non-urgent requests.  _______________________________________________________  [facility] is using a team-based approach to care.  Your team is made up of your doctor and two to three APPS. Our APPS (Nurse Practitioners and Physician Assistants) work with your physician to ensure care continuity for you. They are fully qualified to address your health concerns and develop a treatment plan. They communicate directly with your gastroenterologist to care for you. Seeing the Advanced Practice Practitioners on your physician's team can help you by facilitating care more promptly, often allowing for earlier appointments, access to diagnostic testing, procedures, and other specialty referrals.

## 2023-10-28 NOTE — Progress Notes (Unsigned)
 Stacey Ruiz [id number] 10-Jan-1976   Chief Complaint: Diverticulitis  Referring Provider: Joshua Debby CROME, MD Primary GI MD: Dr. Legrand  HPI: Stacey Ruiz is a 48 y.o. female with past medical history of anemia, anxiety/depression, arthritis, headaches, HTN, family history of colon cancer in mother who presents today for follow-up of diverticulitis.    Patient seen in the ED 08/24/2023 for complaint of abdominal pain. Had been seen by PCP the day before and CT ordered but had issue with her insurance and was not done. Pain had worsened.  CT A/P showed acute sigmoid diverticulitis, fatty liver, and a broad-based ventral hernia with focal protrusion of the segment of the transverse colon without bowel obstruction.  Normal appendix. She was started on Augmentin  and discharged with recommendation for GI follow up.  She was again seen in the ED 08/26/2023 for severe abdominal pain, with associated nausea and sweating.  Her pain had improved so repeat CT scan was not ordered.  She was found to have an elevated lipase of 103 in the setting of normal LFTs.  She had mild right upper quadrant discomfort with negative Murphy's and no epigastric tenderness.  RUQ US  was ordered and showed diffuse hepatic steatosis, no gallstones or changes of acute cholecystitis though there was finding of moderate volume layering of biliary sludge.  CBD diameter 5 mm.  Patient seen by PCP 09/06/2023 for ER follow-up.  Reported that she was feeling better and had completed antibiotics.  Labs 09/06/2023: Normal CBC, normal CMP with no elevation of liver enzymes.  Lipase normal at 17.  Normal amylase.   Before having diverticulitis was having a lot of stomach issues Had to be careful of what she ate Was previously having regular bowel movements But certain things were causing stomach cramps Mid-gut cramping Sometimes sour cream makes her very uncomfortable Doesn't drink milk or eat ice cream Cheese is the only  thing she eats that is dairy  Was having loose stools prior onset of diverticulitis  Took full course of antibiotics  Some occ nausea but no vomiting Denies incomplete emptying   Doesn't have regular movements Alternating diarrhea and constipation Some urgency No blood or black stools Avoids beef, pork, shellfish Low processed foods Eats salads, veggies Drinks a lot of water    Previous GI Procedures/Imaging   RUQ US  08/26/2023 1. No cholecystolithiasis or changes of acute cholecystitis. 2. Diffuse hepatic steatosis.  CT A/P 08/24/2023 1. Acute sigmoid diverticulitis. No diverticular abscess or perforation. 2. Fatty liver. 3. Broad-based ventral hernia with focal protrusion of a segment of the transverse colon. No bowel obstruction. Normal appendix. 4.  Aortic Atherosclerosis (ICD10-I70.0).  Colonoscopy 09/22/2021 - One 5 mm polyp in the ascending colon, removed with a cold snare. Resected and retrieved.  - Diverticulosis in the left colon.  - The examination was otherwise normal on direct and retroflexion views. - Recall 5 years Path: Surgical [P], colon, ascending, polyp (1) - SESSILE SERRATED POLYP WITHOUT DYSPLASIA  Past Medical History:  Diagnosis Date   Anemia    Anxiety    Arthritis    knee right   Depression    Headache(784.0)    migraines   Hypertension     Past Surgical History:  Procedure Laterality Date   ABLATION     FRACTURE SURGERY  04/06/2000   left wrist   TUBAL LIGATION  11/28/2010   Procedure: ESSURE TUBAL STERILIZATION;  Surgeon: Olam DELENA Mill, MD;  Location: [facility];  Service: Gynecology;  Laterality:  N/A;  Attempted essure sterilization. Essure device placed in leftt side only. could not do procedure on right fallopian tube.    TUBAL LIGATION  12/05/2010   Procedure: ESSURE TUBAL STERILIZATION;  Surgeon: Olam DELENA Mill, MD;  Location: [facility];  Service: Gynecology;  Laterality: Right;   WISDOM TOOTH EXTRACTION       Current Outpatient Medications  Medication Sig Dispense Refill   amLODipine  (NORVASC ) 5 MG tablet Take 1 tablet (5 mg total) by mouth daily. 30 tablet 0   amoxicillin -clavulanate (AUGMENTIN ) 875-125 MG tablet Take 1 tablet by mouth 2 (two) times daily. 14 tablet 0   Ascorbic Acid 500 MG CAPS as directed Orally     Cholecalciferol  (VITAMIN D3 PO) Take by mouth daily. Take 5000 iu     Multiple Vitamin (MULTIVITAMIN PO) Take by mouth daily. Lysle own women take one daily     Multiple Vitamins-Minerals (ZINC PO) Take 50 mg by mouth. Take one pill daily     ondansetron  (ZOFRAN -ODT) 4 MG disintegrating tablet Take 1 tablet (4 mg total) by mouth every 8 (eight) hours as needed for nausea or vomiting. 20 tablet 0   OVER THE COUNTER MEDICATION 500 mg daily. [place] tail mushroom vitamin-take one daily     OVER THE COUNTER MEDICATION Oil of oregano drops-take first 5 days of the month     pantoprazole  (PROTONIX ) 40 MG tablet Take 1 tablet (40 mg total) by mouth daily. (Patient not taking: Reported on 09/23/2023) 30 tablet 0   sucralfate  (CARAFATE ) 1 g tablet Take 1 tablet (1 g total) by mouth 2 (two) times daily before lunch and supper. 60 tablet 0   traMADol  (ULTRAM ) 50 MG tablet Take 1 tablet (50 mg total) by mouth every 6 (six) hours as needed. (Patient not taking: Reported on 09/23/2023) 15 tablet 0   triamterene -hydrochlorothiazide (DYAZIDE) 37.5-25 MG capsule TAKE 1 CAPSULE BY MOUTH EVERY DAY. Patient will need to follow up with PCP prior to future refills (Patient not taking: Reported on 09/23/2023) 30 capsule 0   No current facility-administered medications for this visit.    Allergies as of 10/28/2023 - Review Complete 09/23/2023  Allergen Reaction Noted   Hydrocodone  Itching and Nausea And Vomiting 12/17/2018   Oxycodone  Itching and Nausea And Vomiting 12/17/2018    Family History  Problem Relation Age of Onset   Colon cancer Mother    Cancer Mother    Breast cancer Maternal Aunt 87    Diabetes Neg Hx    Early death Neg Hx    Heart disease Neg Hx    Hyperlipidemia Neg Hx    Hypertension Neg Hx    Kidney disease Neg Hx    Stroke Neg Hx    Crohn's disease Neg Hx    Esophageal cancer Neg Hx    Rectal cancer Neg Hx    Stomach cancer Neg Hx     Social History   Tobacco Use   Smoking status: Former    Current packs/day: 0.00    Types: Cigarettes    Quit date: 11/12/2007    Years since quitting: 15.9    Passive exposure: Never   Smokeless tobacco: Never  Vaping Use   Vaping status: Never Used  Substance Use Topics   Alcohol use: Yes    Alcohol/week: 7.0 standard drinks of alcohol    Types: 7 Glasses of wine per week    Comment: occ   Drug use: Not Currently    Types: Marijuana    Comment: gummies  Review of Systems:    Constitutional: No weight loss, fever, chills, weakness or fatigue Eyes: No change in vision Ears, Nose, Throat:  No change in hearing or congestion Skin: No rash or itching Cardiovascular: No chest pain, chest pressure or palpitations   Respiratory: No SOB or cough Gastrointestinal: See HPI and otherwise negative Genitourinary: No dysuria or change in urinary frequency Neurological: No headache, dizziness or syncope Musculoskeletal: No new muscle or joint pain Hematologic: No bleeding or bruising    Physical Exam:  Vital signs: BP 132/78 (BP Location: Left Arm, Patient Position: Sitting, Cuff Size: Normal)   Pulse 89   Ht 5' 5 (1.651 m)   Wt 197 lb 2 oz (89.4 kg)   LMP 08/22/2020 (Approximate)   BMI 32.80 kg/m    Wt Readings from Last 3 Encounters:  10/28/23 197 lb 2 oz (89.4 kg)  09/23/23 192 lb (87.1 kg)  09/06/23 202 lb (91.6 kg)     Constitutional: NAD, Well developed, Well nourished, alert and cooperative Head:  Normocephalic and atraumatic.  Eyes: No scleral icterus. Conjunctiva pink. Mouth: No oral lesions. Respiratory: Respirations even and unlabored. Lungs clear to auscultation bilaterally.  No wheezes,  crackles, or rhonchi.  Cardiovascular:  Regular rate and rhythm. No murmurs. No peripheral edema. Gastrointestinal:  Soft, nondistended, nontender. No rebound or guarding. Normal bowel sounds. No appreciable masses or hepatomegaly. Rectal:  Not performed.  Neurologic:  Alert and oriented x4;  grossly normal neurologically.  Skin:   Dry and intact without significant lesions or rashes. Psychiatric: Oriented to person, place and time. Demonstrates good judgement and reason without abnormal affect or behaviors.   RELEVANT LABS AND IMAGING: CBC    Component Value Date/Time   WBC 5.2 09/06/2023 1340   RBC 4.39 09/06/2023 1340   HGB 12.7 09/06/2023 1340   HCT 37.5 09/06/2023 1340   HCT 42 05/20/2011 0000   PLT 301.0 09/06/2023 1340   MCV 85.4 09/06/2023 1340   MCV 87.2 05/20/2011 0000   MCH 29.5 08/26/2023 0846   MCHC 34.0 09/06/2023 1340   RDW 12.6 09/06/2023 1340   RDW 13.2 05/20/2011 0000   LYMPHSABS 2.8 09/06/2023 1340   MONOABS 0.4 09/06/2023 1340   EOSABS 0.2 09/06/2023 1340   BASOSABS 0.0 09/06/2023 1340    CMP     Component Value Date/Time   NA 138 09/06/2023 1340   K 3.9 09/06/2023 1340   CL 101 09/06/2023 1340   CO2 30 09/06/2023 1340   GLUCOSE 116 (H) 09/06/2023 1340   BUN 11 09/06/2023 1340   CREATININE 0.82 09/06/2023 1340   CREATININE 0.78 11/28/2012 1501   CALCIUM 9.4 09/06/2023 1340   PROT 7.6 09/06/2023 1340   ALBUMIN 4.4 09/06/2023 1340   AST 18 09/06/2023 1340   ALT 18 09/06/2023 1340   ALKPHOS 64 09/06/2023 1340   BILITOT 0.4 09/06/2023 1340   GFRNONAA >60 08/26/2023 0846   GFRAA >60 12/17/2018 1435     Assessment/Plan:   Refer to surgery for ventral hernia Repeat CBC, CMP, TTG, IgA Ibgard Fiber supplement and high fiber diet Dairy elimination Low FODMAP  Calculate FIB-4 with labs today, discuss further management of fatty liver at follow up    Camie Furbish, PA-C [facility] 10/28/2023, 12:45 PM  Patient Care Team: Joshua Debby CROME, MD as PCP - General (Internal Medicine)

## 2023-10-29 ENCOUNTER — Encounter: Payer: Self-pay | Admitting: Gastroenterology

## 2023-10-29 LAB — IGA: Immunoglobulin A: 298 mg/dL (ref 47–310)

## 2023-10-29 LAB — TISSUE TRANSGLUTAMINASE, IGA: (tTG) Ab, IgA: <1 U/mL

## 2023-11-01 ENCOUNTER — Ambulatory Visit: Payer: Self-pay | Admitting: Gastroenterology

## 2023-11-01 NOTE — Progress Notes (Signed)
 ____________________________________________________________  Attending physician addendum:  Thank you for sending this case to me. I have reviewed the entire note and agree with the plan.   Amada Jupiter, MD  ____________________________________________________________

## 2023-11-10 DIAGNOSIS — K432 Incisional hernia without obstruction or gangrene: Secondary | ICD-10-CM | POA: Diagnosis not present

## 2023-12-29 ENCOUNTER — Other Ambulatory Visit: Payer: Self-pay | Admitting: Internal Medicine

## 2023-12-29 DIAGNOSIS — Z1231 Encounter for screening mammogram for malignant neoplasm of breast: Secondary | ICD-10-CM

## 2024-01-05 DIAGNOSIS — M2021 Hallux rigidus, right foot: Secondary | ICD-10-CM | POA: Diagnosis not present

## 2024-01-19 ENCOUNTER — Ambulatory Visit
Admission: RE | Admit: 2024-01-19 | Discharge: 2024-01-19 | Disposition: A | Discharge status: Home / Self Care | Source: Ambulatory Visit | Attending: Internal Medicine | Admitting: Internal Medicine

## 2024-01-19 DIAGNOSIS — Z1231 Encounter for screening mammogram for malignant neoplasm of breast: Secondary | ICD-10-CM | POA: Diagnosis not present

## 2024-01-25 DIAGNOSIS — I10 Essential (primary) hypertension: Secondary | ICD-10-CM | POA: Diagnosis not present

## 2024-01-25 DIAGNOSIS — E781 Pure hyperglyceridemia: Secondary | ICD-10-CM | POA: Diagnosis not present

## 2024-01-25 DIAGNOSIS — E1165 Type 2 diabetes mellitus with hyperglycemia: Secondary | ICD-10-CM | POA: Diagnosis not present

## 2024-01-25 DIAGNOSIS — E559 Vitamin D deficiency, unspecified: Secondary | ICD-10-CM | POA: Diagnosis not present

## 2024-01-28 ENCOUNTER — Ambulatory Visit: Admitting: Gastroenterology

## 2024-01-31 ENCOUNTER — Emergency Department (HOSPITAL_BASED_OUTPATIENT_CLINIC_OR_DEPARTMENT_OTHER)
Admission: EM | Admit: 2024-01-31 | Discharge: 2024-02-01 | Disposition: A | Discharge status: Home / Self Care | Attending: Emergency Medicine | Admitting: Emergency Medicine

## 2024-01-31 ENCOUNTER — Encounter (HOSPITAL_COMMUNITY): Payer: Self-pay | Admitting: *Deleted

## 2024-01-31 ENCOUNTER — Encounter (HOSPITAL_BASED_OUTPATIENT_CLINIC_OR_DEPARTMENT_OTHER): Payer: Self-pay

## 2024-01-31 ENCOUNTER — Other Ambulatory Visit: Payer: Self-pay

## 2024-01-31 ENCOUNTER — Emergency Department (HOSPITAL_BASED_OUTPATIENT_CLINIC_OR_DEPARTMENT_OTHER)

## 2024-01-31 ENCOUNTER — Ambulatory Visit (HOSPITAL_COMMUNITY)
Admission: EM | Admit: 2024-01-31 | Discharge: 2024-01-31 | Disposition: A | Discharge status: Home / Self Care | Attending: Physician Assistant | Admitting: Physician Assistant

## 2024-01-31 ENCOUNTER — Ambulatory Visit: Payer: Self-pay

## 2024-01-31 DIAGNOSIS — H53149 Visual discomfort, unspecified: Secondary | ICD-10-CM | POA: Insufficient documentation

## 2024-01-31 DIAGNOSIS — R112 Nausea with vomiting, unspecified: Secondary | ICD-10-CM | POA: Diagnosis not present

## 2024-01-31 DIAGNOSIS — Z79899 Other long term (current) drug therapy: Secondary | ICD-10-CM | POA: Diagnosis not present

## 2024-01-31 DIAGNOSIS — R739 Hyperglycemia, unspecified: Secondary | ICD-10-CM | POA: Diagnosis not present

## 2024-01-31 DIAGNOSIS — R519 Headache, unspecified: Secondary | ICD-10-CM

## 2024-01-31 DIAGNOSIS — I161 Hypertensive emergency: Secondary | ICD-10-CM

## 2024-01-31 DIAGNOSIS — E876 Hypokalemia: Secondary | ICD-10-CM

## 2024-01-31 DIAGNOSIS — I10 Essential (primary) hypertension: Secondary | ICD-10-CM | POA: Insufficient documentation

## 2024-01-31 LAB — COMPREHENSIVE METABOLIC PANEL WITH GFR
ALT: 16 U/L (ref 0–44)
AST: 22 U/L (ref 15–41)
Albumin: 4.8 g/dL (ref 3.5–5.0)
Alkaline Phosphatase: 91 U/L (ref 38–126)
Anion gap: 13 (ref 5–15)
BUN: <5 mg/dL — ABNORMAL LOW (ref 6–20)
CO2: 24 mmol/L (ref 22–32)
Calcium: 10.2 mg/dL (ref 8.9–10.3)
Chloride: 103 mmol/L (ref 98–111)
Creatinine, Ser: 0.79 mg/dL (ref 0.44–1.00)
GFR, Estimated: >60 mL/min (ref >60)
Glucose, Bld: 146 mg/dL — ABNORMAL HIGH (ref 70–99)
Potassium: 3.7 mmol/L (ref 3.5–5.1)
Sodium: 140 mmol/L (ref 135–145)
Total Bilirubin: 0.7 mg/dL (ref 0.0–1.2)
Total Protein: 8.3 g/dL — ABNORMAL HIGH (ref 6.5–8.1)

## 2024-01-31 LAB — CBC WITH DIFFERENTIAL/PLATELET
Abs Immature Granulocytes: 0.04 K/uL (ref 0.00–0.07)
Basophils Absolute: 0 K/uL (ref 0.0–0.1)
Basophils Relative: 1 %
Eosinophils Absolute: 0 K/uL (ref 0.0–0.5)
Eosinophils Relative: 0 %
HCT: 42.3 % (ref 36.0–46.0)
Hemoglobin: 14.6 g/dL (ref 12.0–15.0)
Immature Granulocytes: 1 %
Lymphocytes Relative: 17 %
Lymphs Abs: 1.4 K/uL (ref 0.7–4.0)
MCH: 29.2 pg (ref 26.0–34.0)
MCHC: 34.5 g/dL (ref 30.0–36.0)
MCV: 84.6 fL (ref 80.0–100.0)
Monocytes Absolute: 0.2 K/uL (ref 0.1–1.0)
Monocytes Relative: 2 %
Neutro Abs: 7 K/uL (ref 1.7–7.7)
Neutrophils Relative %: 79 %
Platelets: 310 K/uL (ref 150–400)
RBC: 5 MIL/uL (ref 3.87–5.11)
RDW: 12.9 % (ref 11.5–15.5)
WBC: 8.7 K/uL (ref 4.0–10.5)
nRBC: 0 % (ref 0.0–0.2)

## 2024-01-31 LAB — POCT RAPID STREP A (OFFICE): Rapid Strep A Screen: NEGATIVE

## 2024-01-31 LAB — POC COVID19/FLU A&B COMBO
Covid Antigen, POC: NEGATIVE
Influenza A Antigen, POC: NEGATIVE
Influenza B Antigen, POC: NEGATIVE

## 2024-01-31 MED ORDER — METOCLOPRAMIDE HCL 5 MG/ML IJ SOLN
10.0000 mg | Freq: Once | INTRAMUSCULAR | Status: AC
  Administered 2024-01-31: 10 mg via INTRAVENOUS
  Filled 2024-01-31: qty 2

## 2024-01-31 MED ORDER — DEXAMETHASONE SOD PHOSPHATE PF 10 MG/ML IJ SOLN
10.0000 mg | Freq: Once | INTRAMUSCULAR | Status: AC
  Administered 2024-01-31: 10 mg via INTRAVENOUS

## 2024-01-31 MED ORDER — LIDOCAINE-EPINEPHRINE (PF) 2 %-1:200000 IJ SOLN
INTRAMUSCULAR | Status: AC
  Filled 2024-01-31: qty 20

## 2024-01-31 MED ORDER — KETOROLAC TROMETHAMINE 15 MG/ML IJ SOLN
15.0000 mg | Freq: Once | INTRAMUSCULAR | Status: AC
  Administered 2024-01-31: 15 mg via INTRAVENOUS
  Filled 2024-01-31: qty 1

## 2024-01-31 MED ORDER — DIPHENHYDRAMINE HCL 50 MG/ML IJ SOLN
12.5000 mg | Freq: Once | INTRAMUSCULAR | Status: AC
  Administered 2024-01-31: 12.5 mg via INTRAVENOUS
  Filled 2024-01-31: qty 1

## 2024-01-31 MED ORDER — ACETAMINOPHEN 500 MG PO TABS
1000.0000 mg | ORAL_TABLET | Freq: Once | ORAL | Status: DC
Start: 2024-01-31 — End: 2024-02-01
  Filled 2024-01-31: qty 2

## 2024-01-31 NOTE — ED Provider Notes (Cosign Needed Addendum)
 [facility]    CSN: [id number] Arrival date & time: 01/31/24  1839      History   Chief Complaint Chief Complaint  Patient presents with   Facial Pain   Cough   Sore Throat    HPI Stacey Ruiz is a 48 y.o. female.   Patient presents today with a several hour history of severe headache, facial pain, mild congestion/cough.  She reports that her symptoms began when she woke up this morning.  She has been around several children but denies any specific sick contacts.  She reports that the headache pain is rated 9 on a 0-10 pain scale, localized to the frontal region with associated nausea though no vomiting and photophobia.  She reports a remote history of migraines when she was younger with similar presentation but states that this is the worst headache she has ever had.  She does have a history of hypertension managed with amlodipine  but reports that she last took a dose approximately 48 hours ago as she ran out of this medication.  She has been taking DayQuil and NyQuil without improvement of symptoms.      Past Medical History:  Diagnosis Date   Anemia    Anxiety    Arthritis    knee right   Depression    Diabetes (HCC)    Headache(784.0)    migraines   Hypertension     Patient Active Problem List   Diagnosis Date Noted   Flu-like symptoms 09/23/2023   MDD (major depressive disorder), recurrent episode, mild 09/12/2023   RLQ abdominal pain 09/06/2023   GAD (generalized anxiety disorder) 09/06/2023   DOE (dyspnea on exertion) 01/12/2023   Hypercalcemia 01/12/2023   Prediabetes 06/18/2022   Screen for colon cancer 05/13/2021   Encounter for general adult medical examination with abnormal findings 05/13/2021   Psychophysiological insomnia 01/31/2019   Severe episode of recurrent major depressive disorder, without psychotic features (HCC) 12/27/2018   Vitamin D  deficiency 07/27/2017   Hot flashes, menopausal 07/16/2017   Visit for screening mammogram  05/20/2015   Viral upper respiratory tract infection 04/19/2015   Class 1 obesity due to excess calories with serious comorbidity and body mass index (BMI) of 33.0 to 33.9 in adult 11/28/2012   Fear of flying 10/12/2011   Essential hypertension, benign 09/07/2011   Routine general medical examination at a health care facility 09/07/2011   Contraceptive management 11/28/2010    Past Surgical History:  Procedure Laterality Date   ABLATION     FRACTURE SURGERY  04/06/2000   left wrist   TUBAL LIGATION  11/28/2010   Procedure: ESSURE TUBAL STERILIZATION;  Surgeon: Olam DELENA Mill, MD;  Location: [facility];  Service: Gynecology;  Laterality: N/A;  Attempted essure sterilization. Essure device placed in leftt side only. could not do procedure on right fallopian tube.    TUBAL LIGATION  12/05/2010   Procedure: ESSURE TUBAL STERILIZATION;  Surgeon: Olam DELENA Mill, MD;  Location: [facility];  Service: Gynecology;  Laterality: Right;   WISDOM TOOTH EXTRACTION      OB History     Gravida  1   Para      Term      Preterm      AB      Living         SAB      IAB      Ectopic      Multiple      Live Births  Home Medications    Prior to Admission medications   Medication Sig Start Date End Date Taking? Authorizing Provider  amLODipine  (NORVASC ) 5 MG tablet Take 1 tablet (5 mg total) by mouth daily. 08/23/23  Yes Merlynn Eland F, FNP  triamterene -hydrochlorothiazide (DYAZIDE) 37.5-25 MG capsule TAKE 1 CAPSULE BY MOUTH EVERY DAY. Patient will need to follow up with PCP prior to future refills 09/17/23  Yes Joshua Debby CROME, MD  Cholecalciferol  (VITAMIN D3 PO) Take by mouth daily. Take 5000 iu    [provider]  Multiple Vitamin (MULTIVITAMIN PO) Take by mouth daily. Lysle own women take one daily    [provider]  Multiple Vitamins-Minerals (ZINC PO) Take 50 mg by mouth. Take one pill daily    [provider]  OVER THE COUNTER  MEDICATION 500 mg daily. [place] tail mushroom vitamin-take one daily    [provider]  OVER THE COUNTER MEDICATION Oil of oregano drops-take first 5 days of the month    [provider]  pantoprazole  (PROTONIX ) 40 MG tablet Take 1 tablet (40 mg total) by mouth daily. Patient not taking: Reported on 10/28/2023 08/26/23   Charlyn Sora, MD  Peppermint Oil (IBGARD) 90 MG CPCR Take 2 capsules, up to 3 times a day, as needed 10/28/23   Arletta, Camie BRAVO, PA-C  sucralfate  (CARAFATE ) 1 g tablet Take 1 tablet (1 g total) by mouth 2 (two) times daily before lunch and supper. Patient not taking: Reported on 10/28/2023 08/26/23   Charlyn Sora, MD    Family History Family History  Problem Relation Age of Onset   Colon cancer Mother    Cancer Mother    Breast cancer Maternal Aunt 39   Diabetes Neg Hx    Early death Neg Hx    Heart disease Neg Hx    Hyperlipidemia Neg Hx    Hypertension Neg Hx    Kidney disease Neg Hx    Stroke Neg Hx    Crohn's disease Neg Hx    Esophageal cancer Neg Hx    Rectal cancer Neg Hx    Stomach cancer Neg Hx     Social History Social History   Tobacco Use   Smoking status: Former    Current packs/day: 0.00    Types: Cigarettes    Quit date: 11/12/2007    Years since quitting: 16.2    Passive exposure: Never   Smokeless tobacco: Never  Vaping Use   Vaping status: Never Used  Substance Use Topics   Alcohol use: Yes    Alcohol/week: 7.0 standard drinks of alcohol    Types: 7 Glasses of wine per week    Comment: occ   Drug use: Not Currently    Types: Marijuana    Comment: gummies     Allergies   Hydrocodone  and Oxycodone    Review of Systems Review of Systems  Constitutional:  Positive for activity change. Negative for appetite change, fatigue and fever.  HENT:  Positive for congestion and sore throat (Scratchy). Negative for sinus pressure and sneezing.   Eyes:  Positive for photophobia. Negative for visual disturbance.   Respiratory:  Positive for cough. Negative for shortness of breath.   Gastrointestinal:  Positive for nausea. Negative for vomiting.  Neurological:  Positive for headaches. Negative for dizziness and light-headedness.     Physical Exam Triage Vital Signs ED Triage Vitals  Encounter Vitals Group     BP 01/31/24 1943 (!) 174/100     Girls Systolic BP Percentile --  Girls Diastolic BP Percentile --      Boys Systolic BP Percentile --      Boys Diastolic BP Percentile --      Pulse Rate 01/31/24 1943 69     Resp 01/31/24 1943 20     Temp 01/31/24 1943 99.2 F (37.3 C)     Temp src --      SpO2 01/31/24 1943 98 %     Weight --      Height --      Head Circumference --      Peak Flow --      Pain Score 01/31/24 1939 9     Pain Loc --      Pain Education --      Exclude from Growth Chart --    No data found.  Updated Vital Signs BP (!) 184/116 (BP Location: Left Arm)   Pulse 69   Temp 99.2 F (37.3 C)   Resp 20   LMP 08/22/2020 (Approximate)   SpO2 98%   Visual Acuity Right Eye Distance:   Left Eye Distance:   Bilateral Distance:    Right Eye Near:   Left Eye Near:    Bilateral Near:     Physical Exam Vitals reviewed.  Constitutional:      General: She is awake. She is not in acute distress.    Appearance: Normal appearance. She is well-developed. She is ill-appearing.     Comments: Very pleasant female appears stated age in no acute distress lying in a darkened exam room holding her head  HENT:     Head: Normocephalic and atraumatic. No raccoon eyes, Battle's sign or contusion.     Right Ear: Tympanic membrane, ear canal and external ear normal. No hemotympanum.     Left Ear: Tympanic membrane, ear canal and external ear normal. No hemotympanum.     Nose: Nose normal.     Mouth/Throat:     Tongue: Tongue does not deviate from midline.     Pharynx: Uvula midline. No oropharyngeal exudate or posterior oropharyngeal erythema.  Eyes:     Extraocular  Movements: Extraocular movements intact.     Pupils: Pupils are equal, round, and reactive to light.  Cardiovascular:     Rate and Rhythm: Normal rate and regular rhythm.     Heart sounds: Normal heart sounds, S1 normal and S2 normal. No murmur heard. Pulmonary:     Effort: Pulmonary effort is normal.     Breath sounds: Normal breath sounds. No wheezing, rhonchi or rales.     Comments: Clear to auscultation bilaterally Musculoskeletal:     Cervical back: No spinous process tenderness or muscular tenderness.     Comments: Strength 5/5 bilateral upper and lower extremities  Neurological:     General: No focal deficit present.     Mental Status: She is alert and oriented to person, place, and time.     Cranial Nerves: Cranial nerves 2-12 are intact.     Motor: Motor function is intact.     Coordination: Coordination is intact. Romberg sign negative. Rapid alternating movements normal.     Gait: Gait is intact.     Comments: Cranial nerves II through XII grossly intact.  No focal neurologic defect on exam.  Psychiatric:        Behavior: Behavior is cooperative.      UC Treatments / Results  Labs (all labs ordered are listed, but only abnormal results are displayed) Labs Reviewed  POC COVID19/FLU A&B  COMBO  POCT RAPID STREP A (OFFICE)  POCT RAPID STREP A (OFFICE)    EKG   Radiology No results found.  Procedures Procedures (including critical care time)  Medications Ordered in UC Medications - No data to display  Initial Impression / Assessment and Plan / UC Course  I have reviewed the triage vital signs and the nursing notes.  Pertinent labs & imaging results that were available during my care of the patient were reviewed by me and considered in my medical decision making (see chart for details).     Patient is well-appearing but afebrile, nontoxic, nontachycardic.  Her blood pressure was significantly elevated and we discussed that given her elevated blood pressure  reading in the setting of a severe headache this evening need to go to the emergency room.  Patient initially refused to go to the ER because that she has to leave in the morning for a work trip we discussed that I cannot rule out a more serious cause of her symptoms given her limited resources in urgent care.  During triage they did test her for COVID, flu, strep and this was all negative.  After discussing my concerns patient was agreeable to going to the emergency room for further evaluation and management.  She was stable at time of discharge with a normal neurologic exam and will go directly to the ER.  Final Clinical Impressions(s) / UC Diagnoses   Final diagnoses:  Hypertensive emergency  Worst headache of life   Discharge Instructions   None    ED Prescriptions   None    PDMP not reviewed this encounter.   Sherrell Rocky POUR, PA-C 01/31/24 2030    RaspetRocky POUR, PA-C 01/31/24 2033

## 2024-01-31 NOTE — Telephone Encounter (Signed)
 FYI Only or Action Required?: FYI only for provider.  Patient was last seen in primary care on 09/23/2023 by Purcell Emil Schanz, MD.  Called Nurse Triage reporting Nasal Congestion.  Symptoms began today.  Interventions attempted: OTC medications: tylenol , Nyquil.  Symptoms are: gradually worsening.  Triage Disposition: No disposition on file.  Patient/caregiver understands and will follow disposition?:    Message from Alfonso HERO sent at 01/31/2024  3:11 PM EDT   Reason for Triage: patient woke up feeling bad with head congestion. She is going out of town for work tmrw and needs something called in.     Reason for Disposition  [1] SEVERE sinus pain (e.g., excruciating) AND [2] not improved 2 hours after pain medicine  Answer Assessment - Initial Assessment Questions Patient started having symptoms this morning when she woke. Sinus congestion, headache, productive cough. Tylenol  and Nyquil with no improvement. She is supposed to fly out for a work trip tomorrow and asking for Abx. Advised she would need to be seen and assessed for abx or cough medicine. No appts early enough in the morning. Advised on UC wait times and hours of operation. Patient will pick up kids and get to UC for possible abx  1. LOCATION: Where does it hurt?      Face, nose, jaw- congestion causing pain  2. ONSET: When did the sinus pain start?  (e.g., hours, days)      This mornign 3. SEVERITY: How bad is the pain?   (Scale 0-10; or none, mild, moderate or severe)     9/10 pain  4. RECURRENT SYMPTOM: Have you ever had sinus problems before? If Yes, ask: When was the last time? and What happened that time?      Similar illness in June 5. NASAL CONGESTION: Is the nose blocked? If Yes, ask: Can you open it or must you breathe through your mouth?     Can breath through nose but sinuses are so congested that there is just pain and pressure 6. NASAL DISCHARGE: Do you have discharge from your nose?  If so ask, What color?     Coughing up green, yellow. No nasal discharge 7. FEVER: Do you have a fever? If Yes, ask: What is it, how was it measured, and when did it start?      denies 8. OTHER SYMPTOMS: Do you have any other symptoms? (e.g., sore throat, cough, earache, difficulty breathing)  Protocols used: Sinus Pain or Congestion-A-AH

## 2024-01-31 NOTE — Telephone Encounter (Signed)
   Message from Alfonso HERO sent at 01/31/2024  3:11 PM EDT  Reason for Triage: patient woke up feeling bad with head congestion. She is going out of town for work tmrw and needs something called in.

## 2024-01-31 NOTE — ED Triage Notes (Signed)
 PT reports sore throat,facial pain and cough started last night

## 2024-01-31 NOTE — ED Triage Notes (Signed)
 Pt c/o worst HA of my life, I just left UC for the same, they wouldn't do anything for me & sent me here.  Little itchiness in my throat last night, congestion symptoms. Advises off-brand day/ nyquil, 1g tylenol  for symptoms. States she is out of BP meds 180/110 at [facility]

## 2024-01-31 NOTE — ED Provider Notes (Signed)
 [place] EMERGENCY DEPARTMENT AT [facility] Provider Note   CSN: [id number] Arrival date & time: 01/31/24  2034     Patient presents with: No chief complaint on file.   Allanah ERIKO ECONOMOS is a 48 y.o. female.   HPI   48 year old female with medical history significant for HTN, obesity, depression, generalized anxiety disorder presenting to the emergency department with chief complaint of headache.  The patient states that she woke up this morning with a headache.  She was normal when she went to bed last night around 11.  She has had a severe headache all day, described as frontal in location, associated light sensitivity and sound sensitivity and now associated nausea and vomiting.  She denies any fever or neck stiffness or rigidity.  She does state that this is the worst headache she has ever experienced in her life.  She denies any focal neurologic deficits.  Prior to Admission medications   Medication Sig Start Date End Date Taking? Authorizing Provider  amLODipine  (NORVASC ) 5 MG tablet Take 1 tablet (5 mg total) by mouth daily. 08/23/23   Merlynn Niki FALCON, FNP  Cholecalciferol  (VITAMIN D3 PO) Take by mouth daily. Take 5000 iu    [provider]  Multiple Vitamin (MULTIVITAMIN PO) Take by mouth daily. Lysle own women take one daily    [provider]  Multiple Vitamins-Minerals (ZINC PO) Take 50 mg by mouth. Take one pill daily    [provider]  OVER THE COUNTER MEDICATION 500 mg daily. Turkey tail mushroom vitamin-take one daily    [provider]  OVER THE COUNTER MEDICATION Oil of oregano drops-take first 5 days of the month    [provider]  pantoprazole  (PROTONIX ) 40 MG tablet Take 1 tablet (40 mg total) by mouth daily. Patient not taking: Reported on 10/28/2023 08/26/23   Charlyn Sora, MD  Peppermint Oil (IBGARD) 90 MG CPCR Take 2 capsules, up to 3 times a day, as needed 10/28/23   Arletta, Camie BRAVO, PA-C  sucralfate   (CARAFATE ) 1 g tablet Take 1 tablet (1 g total) by mouth 2 (two) times daily before lunch and supper. Patient not taking: Reported on 10/28/2023 08/26/23   Nanavati, Ankit, MD  triamterene -hydrochlorothiazide (DYAZIDE) 37.5-25 MG capsule TAKE 1 CAPSULE BY MOUTH EVERY DAY. Patient will need to follow up with PCP prior to future refills 09/17/23   Joshua Debby CROME, MD    Allergies: Hydrocodone  and Oxycodone     Review of Systems  All other systems reviewed and are negative.   Updated Vital Signs BP (!) 174/101   Pulse 97   Temp 97.9 F (36.6 C)   Resp 16   LMP 08/22/2020 (Approximate)   SpO2 100%   Physical Exam Vitals and nursing note reviewed.  Constitutional:      General: She is not in acute distress.    Appearance: She is well-developed.  HENT:     Head: Normocephalic and atraumatic.  Eyes:     Conjunctiva/sclera: Conjunctivae normal.     Pupils: Pupils are equal, round, and reactive to light.  Neck:     Comments: No meningismus Cardiovascular:     Rate and Rhythm: Normal rate and regular rhythm.     Heart sounds: No murmur heard. Pulmonary:     Effort: Pulmonary effort is normal. No respiratory distress.     Breath sounds: Normal breath sounds.  Abdominal:     Palpations: Abdomen is soft.     Tenderness: There is no abdominal  tenderness.  Musculoskeletal:        General: No swelling.     Cervical back: Normal range of motion and neck supple. No rigidity.  Skin:    General: Skin is warm and dry.     Capillary Refill: Capillary refill takes less than 2 seconds.  Neurological:     Mental Status: She is alert.     Comments: MENTAL STATUS EXAM:    Orientation: Alert and oriented to person, place and time.  Memory: Cooperative, follows commands well.  Language: Speech is clear and language is normal.   CRANIAL NERVES:    CN 2 (Optic): Visual fields intact to confrontation.  CN 3,4,6 (EOM): Pupils equal and reactive to light. Full extraocular eye movement without  nystagmus.  CN 5 (Trigeminal): Facial sensation is normal, no weakness of masticatory muscles.  CN 7 (Facial): No facial weakness or asymmetry.  CN 8 (Auditory): Auditory acuity grossly normal.  CN 9,10 (Glossophar): The uvula is midline, the palate elevates symmetrically.  CN 11 (spinal access): Normal sternocleidomastoid and trapezius strength.  CN 12 (Hypoglossal): The tongue is midline. No atrophy or fasciculations.SABRA   MOTOR:  Muscle Strength: 5/5RUE, 5/5LUE, 5/5RLE, 5/5LLE.   COORDINATION:  No tremor.   SENSATION:   Intact to light touch all four extremities.  GAIT: Gait not assessed  Psychiatric:        Mood and Affect: Mood normal.     (all labs ordered are listed, but only abnormal results are displayed) Labs Reviewed  COMPREHENSIVE METABOLIC PANEL WITH GFR - Abnormal; Notable for the following components:      Result Value   Glucose, Bld 146 (*)    BUN <5 (*)    Total Protein 8.3 (*)    All other components within normal limits  CSF CULTURE W GRAM STAIN  CBC WITH DIFFERENTIAL/PLATELET  CSF CELL COUNT WITH DIFFERENTIAL  CSF CELL COUNT WITH DIFFERENTIAL  PROTEIN AND GLUCOSE, CSF  MENINGITIS/ENCEPHALITIS PANEL (CSF)    EKG: None  Radiology: CT Head Wo Contrast Result Date: 01/31/2024 EXAM: CT HEAD WITHOUT CONTRAST 01/31/2024 09:00:33 PM TECHNIQUE: CT of the head was performed without the administration of intravenous contrast. Automated exposure control, iterative reconstruction, and/or weight based adjustment of the mA/kV was utilized to reduce the radiation dose to as low as reasonably achievable. COMPARISON: MRI head 09/14/2017. CLINICAL HISTORY: HA, r/o CVA per EDP. Table formatting from the original note was not included.; Images from the original note were not included.; Triage note:; Pt c/o worst HA of my life, I just left [facility] for the same, they wouldn't do anything for me \\T \ sent me here. ; Little itchiness in my throat last night, congestion symptoms.  Advises off-brand day/ nyquil, 1g tylenol  for symptoms. States she is out of BP meds; 180/110 at [facility] FINDINGS: BRAIN AND VENTRICLES: No acute hemorrhage. No evidence of acute infarct. No hydrocephalus. No extra-axial collection. No mass effect or midline shift. Partial empty sella . ORBITS: No acute abnormality. SINUSES: No acute abnormality. SOFT TISSUES AND SKULL: No acute soft tissue abnormality. No skull fracture. IMPRESSION: 1. No acute intracranial abnormality. 2. Partial empty sella. The finding is often a normal anatomic variant but can be associated with idiopathic intracranial hypertension (pseudotumor cerebri). Electronically signed by: Morgane Naveau MD 01/31/2024 09:10 PM EDT RP Workstation: [id number]     Lumbar Puncture  Date/Time: 01/31/2024 10:52 PM  Performed by: Jerrol Agent, MD Authorized by: Jerrol Agent, MD   Consent:    Consent obtained:  Written  Consent given by:  Patient   Risks discussed:  Bleeding, infection, headache, nerve damage, repeat procedure and pain   Alternatives discussed:  Delayed treatment and alternative treatment Universal protocol:    Patient identity confirmed:  Arm band and verbally with patient Pre-procedure details:    Procedure purpose:  Diagnostic   Preparation: Patient was prepped and draped in usual sterile fashion   Anesthesia:    Anesthesia method:  Local infiltration   Local anesthetic:  Lidocaine  1% w/o epi Procedure details:    Lumbar space:  L4-L5 interspace   Patient position:  L lateral decubitus   Needle gauge:  22   Needle type:  Spinal needle - Quincke tip   Needle length (in):  3.5   Ultrasound guidance: no     Number of attempts:  4 Post-procedure details:    Puncture site:  Adhesive bandage applied   Procedure completion:  Procedure terminated electively by provider Comments:     Unsuccessful LP after 4 attempts    Medications Ordered in the ED  acetaminophen  (TYLENOL ) tablet 1,000 mg (0 mg Oral Hold 01/31/24  2212)  lidocaine -EPINEPHrine (XYLOCAINE  W/EPI) 2 %-1:200000 (PF) injection (has no administration in time range)  metoCLOPramide  (REGLAN ) injection 10 mg (10 mg Intravenous Given 01/31/24 2209)  diphenhydrAMINE  (BENADRYL ) injection 12.5 mg (12.5 mg Intravenous Given 01/31/24 2209)  dexamethasone  (DECADRON ) injection 10 mg (10 mg Intravenous Given 01/31/24 2300)  ketorolac  (TORADOL ) 15 MG/ML injection 15 mg (15 mg Intravenous Given 01/31/24 2300)                                    Medical Decision Making Amount and/or Complexity of Data Reviewed Labs: ordered.  Risk OTC drugs. Prescription drug management. Decision regarding hospitalization.    48 year old female with medical history significant for HTN, obesity, depression, generalized anxiety disorder presenting to the emergency department with chief complaint of headache.  The patient states that she woke up this morning with a headache.  She was normal when she went to bed last night around 11.  She has had a severe headache all day, described as frontal in location, associated light sensitivity and sound sensitivity and now associated nausea and vomiting.  She denies any fever or neck stiffness or rigidity.  She does state that this is the worst headache she has ever experienced in her life.  She denies any focal neurologic deficits.  Dierdre FARRIN SHADLE is a 48 y.o. female who presents with severe headache as per above. I have reviewed the nursing documentation for past medical history, family history, and social history. I have reviewed the EMR and have learned that she has no hx of headaches.  On arrival, she was vitally stable, afebrile, hypertensive BP 181/121, not tachycardic or tachypneic, saturating well on room air. Currently she is awake, alert, GCS 15, HDS, and afebrile.  exam is most notable for fully intact extraocular motions with bilaterally reactive pupils, no focal neurologic deficits, no meningismus, and no temporal  tenderness. There is no rash. The headache was present on awakening and is described as the worst headache of my life.   I am most concerned for [facility].  To further evaluate and risk stratify her, labs and imaging were obtained, which were significant for:  Labs: Labs from urgent care reviewed and revealed no evidence of COVID flu or RSV, rapid strep negative.  CBC without a leukocytosis or anemia, CMP generally unremarkable,  mild hyperglycemia to 146 noted. PT without CP of SOB.   Imaging:  Initial CT head negative for acute intracranial abnormality IMPRESSION:  1. No acute intracranial abnormality.  2. Partial empty sella. The finding is often a normal anatomic variant but can  be associated with idiopathic intracranial hypertension (pseudotumor cerebri).   Unfortunately the patient describes headache as worst of life and it woke her up from sleep and she presents outside the window of 6 hours for CT head to rule out subarachnoid hemorrhage.  In the setting of nausea and vomiting, light sensitivity, sound sensitivity, considered migraine headache however I informed the patient that I was unable to rule out subarachnoid hemorrhage without further diagnostic testing.  Discussed the risks and benefits of both lumbar puncture versus CT angiogram, lumbar puncture still described as gold standard testing versus CTA.  Patient states after discussion of risks and benefits that she would like to pursue lumbar puncture at this time.  The patient was consented for LP and this was performed as per the procedure note above, unfortunately unsuccessful procedure. Pt still with persistent symptoms, IR consulted for LP under fluoro guidance. Pt administered additional Decadron  and Toradol  for headache. No concern for meningitis, pt afebrile and without meningismus, will hold on empiric ABX at this time.   IR: Consult placed for LP in the AM with IR. Medicine consulted for admission for observation.  Discussed with  on-call neurology, Dr. Michaela. Medicine consult for admission pending at time of signout. Signout given to Dr. Geroldine at 2330.   ED Medication Summary: Medications  acetaminophen  (TYLENOL ) tablet 1,000 mg (0 mg Oral Hold 01/31/24 2212)  lidocaine -EPINEPHrine (XYLOCAINE  W/EPI) 2 %-1:200000 (PF) injection (has no administration in time range)  metoCLOPramide  (REGLAN ) injection 10 mg (10 mg Intravenous Given 01/31/24 2209)  diphenhydrAMINE  (BENADRYL ) injection 12.5 mg (12.5 mg Intravenous Given 01/31/24 2209)  dexamethasone  (DECADRON ) injection 10 mg (10 mg Intravenous Given 01/31/24 2300)  ketorolac  (TORADOL ) 15 MG/ML injection 15 mg (15 mg Intravenous Given 01/31/24 2300)        Final diagnoses:  Severe headache  Worst headache of life    ED Discharge Orders     None          Jerrol Agent, MD 01/31/24 2329

## 2024-01-31 NOTE — Plan of Care (Signed)
 Plan of Care Note for accepted transfer   Patient name: Stacey Ruiz FMW:[id number] DOB: 1975/12/18  Facility requesting transfer: [facility] Requesting Provider: Dr. Geroldine Facility course: 48 year old female with history of hypertension, type 2 diabetes, anemia, anxiety, depression, arthritis presented the ED with complaint of severe headache described as the worst headache of her life associated with nausea, vomiting, and light/sound sensitivity.  Blood pressure 174/101 on arrival.  No fever or leukocytosis.  No meningeal signs.    CT head without contrast showing: IMPRESSION: 1. No acute intracranial abnormality. 2. Partial empty sella. The finding is often a normal anatomic variant but can be associated with idiopathic intracranial hypertension (pseudotumor cerebri).  Since patient presented outside the 6-hour window for CT head to rule out subarachnoid hemorrhage and suspicion remained high, LP was attempted but unsuccessful and will need IR consultation for LP under fluoroscopy guidance.  Patient was given Decadron  and Toradol  for headache.  Case was discussed with neurologist Dr. Michaela.  Plan of care: The patient is accepted for admission to Progressive unit at [facility].  [facility] will assume care on arrival to accepting facility. Until arrival, care as per EDP. However, [facility] available 24/7 for questions and assistance.  Check [email or web address] for on-call coverage.  Nursing staff, please call [facility] Admits & Consults System-Wide number under Amion on patient's arrival so appropriate admitting provider can evaluate the pt.

## 2024-02-01 ENCOUNTER — Emergency Department (HOSPITAL_BASED_OUTPATIENT_CLINIC_OR_DEPARTMENT_OTHER)

## 2024-02-01 DIAGNOSIS — R519 Headache, unspecified: Secondary | ICD-10-CM | POA: Diagnosis not present

## 2024-02-01 DIAGNOSIS — I6523 Occlusion and stenosis of bilateral carotid arteries: Secondary | ICD-10-CM | POA: Diagnosis not present

## 2024-02-01 DIAGNOSIS — E041 Nontoxic single thyroid nodule: Secondary | ICD-10-CM | POA: Diagnosis not present

## 2024-02-01 MED ORDER — KETOROLAC TROMETHAMINE 30 MG/ML IJ SOLN
30.0000 mg | Freq: Once | INTRAMUSCULAR | Status: AC
  Administered 2024-02-01: 30 mg via INTRAVENOUS
  Filled 2024-02-01: qty 1

## 2024-02-01 MED ORDER — METOCLOPRAMIDE HCL 5 MG/ML IJ SOLN
10.0000 mg | Freq: Once | INTRAMUSCULAR | Status: AC
  Administered 2024-02-01: 10 mg via INTRAVENOUS
  Filled 2024-02-01: qty 2

## 2024-02-01 MED ORDER — AMLODIPINE BESYLATE 5 MG PO TABS: 5.0000 mg | ORAL_TABLET | Freq: Every day | ORAL | 0 refills | Status: DC

## 2024-02-01 MED ORDER — TRIAMTERENE-HCTZ 37.5-25 MG PO CAPS: ORAL_CAPSULE | ORAL | 0 refills | Status: DC

## 2024-02-01 MED ORDER — IOHEXOL 350 MG/ML SOLN
75.0000 mL | Freq: Once | INTRAVENOUS | Status: AC | PRN
  Administered 2024-02-01: 75 mL via INTRAVENOUS

## 2024-02-01 NOTE — Discharge Instructions (Signed)
 Take ibuprofen  600 mg rotated with Tylenol  1000 mg every 3 hours as needed for pain.  Return to the emergency department if you develop worsening headache, or for other new and concerning symptoms.

## 2024-02-01 NOTE — ED Provider Notes (Signed)
  Physical Exam  BP (!) 148/97   Pulse 81   Temp 97.9 F (36.6 C)   Resp 14   LMP 08/22/2020 (Approximate)   SpO2 94%   Physical Exam Vitals and nursing note reviewed.  Constitutional:      Appearance: Normal appearance.  HENT:     Head: Normocephalic.  Pulmonary:     Effort: Pulmonary effort is normal.  Neurological:     General: No focal deficit present.     Mental Status: She is alert and oriented to person, place, and time.     Procedures  Procedures  ED Course / MDM    Medical Decision Making Amount and/or Complexity of Data Reviewed Labs: ordered.  Risk OTC drugs. Prescription drug management. Decision regarding hospitalization.   Care assumed from Dr. Jerrol at shift change.  Patient presenting here with severe headache that started earlier this evening.  CT scan was negative, but LP attempt was unsuccessful.  Care was discussed with Dr. Michaela from neurology and recommendations were made for admission and IR consultation for guided LP.  While waiting for a bed, the patient states that her headache has much improved and she would like to go home.  She has been reassessed and remains neurologically intact.  At this point, I will discharge the patient at her request.  She has also requested refills of her blood pressure medications which I will provide.  She is to return as needed if symptoms worsen or change.  Patient understands that subarachnoid hemorrhage has not been fully ruled out, but tells me she will return if her headache returns or worsens.       Stacey Berg, MD 02/01/24 0330

## 2024-02-04 NOTE — Telephone Encounter (Signed)
Patient was seen in [facility].

## 2024-02-24 ENCOUNTER — Inpatient Hospital Stay: Admitting: Internal Medicine

## 2024-05-03 ENCOUNTER — Telehealth: Payer: Self-pay

## 2024-05-03 NOTE — Telephone Encounter (Signed)
 Copied from CRM #[id number]. Topic: Clinical - Medication Refill >> May 03, 2024 10:02 AM Drema MATSU wrote: Medication:  amLODipine  (NORVASC ) 5 MG tablet riamterene-hydrochlorothiazide (DYAZIDE) 37.5-25 MG capsule  Has the patient contacted their pharmacy? Yes (Agent: If no, request that the patient contact the pharmacy for the refill. If patient does not wish to contact the pharmacy document the reason why and proceed with request.) tried to reach out (Agent: If yes, when and what did the pharmacy advise?)  This is the patient's preferred pharmacy:  [facility]  [address], [place]  Is this the correct pharmacy for this prescription? Yes If no, delete pharmacy and type the correct one.   Has the prescription been filled recently? No  Is the patient out of the medication? Yes  Has the patient been seen for an appointment in the last year OR does the patient have an upcoming appointment? Yes  Can we respond through MyChart? Yes  Agent: Please be advised that Rx refills may take up to 3 business days. We ask that you follow-up with your pharmacy.

## 2024-05-05 ENCOUNTER — Other Ambulatory Visit: Payer: Self-pay

## 2024-05-05 DIAGNOSIS — I10 Essential (primary) hypertension: Secondary | ICD-10-CM

## 2024-05-05 DIAGNOSIS — E876 Hypokalemia: Secondary | ICD-10-CM

## 2024-05-05 MED ORDER — TRIAMTERENE-HCTZ 37.5-25 MG PO CAPS: ORAL_CAPSULE | ORAL | 0 refills | Status: AC

## 2024-05-05 MED ORDER — AMLODIPINE BESYLATE 5 MG PO TABS: 5.0000 mg | ORAL_TABLET | Freq: Every day | ORAL | 0 refills | Status: AC

## 2024-05-05 NOTE — Telephone Encounter (Signed)
 Patient has been scheduled to see Dr. Joshua and a 17 day supply of her BP medication has been sent in.

## 2024-05-22 ENCOUNTER — Ambulatory Visit: Admitting: Internal Medicine
